# Patient Record
Sex: Male | Born: 1935
Health system: Southern US, Community
[De-identification: ages and names within clinical notes are randomized; demographics above are authoritative.]

## PROBLEM LIST (undated history)

## (undated) DIAGNOSIS — C4490 Unspecified malignant neoplasm of skin, unspecified: Secondary | ICD-10-CM

## (undated) DIAGNOSIS — E039 Hypothyroidism, unspecified: Secondary | ICD-10-CM

## (undated) DIAGNOSIS — K5730 Diverticulosis of large intestine without perforation or abscess without bleeding: Secondary | ICD-10-CM

## (undated) DIAGNOSIS — K589 Irritable bowel syndrome without diarrhea: Secondary | ICD-10-CM

## (undated) DIAGNOSIS — K2270 Barrett's esophagus without dysplasia: Secondary | ICD-10-CM

## (undated) DIAGNOSIS — N401 Enlarged prostate with lower urinary tract symptoms: Secondary | ICD-10-CM

## (undated) DIAGNOSIS — F329 Major depressive disorder, single episode, unspecified: Secondary | ICD-10-CM

## (undated) DIAGNOSIS — N138 Other obstructive and reflux uropathy: Secondary | ICD-10-CM

## (undated) DIAGNOSIS — K449 Diaphragmatic hernia without obstruction or gangrene: Secondary | ICD-10-CM

## (undated) DIAGNOSIS — I10 Essential (primary) hypertension: Secondary | ICD-10-CM

## (undated) DIAGNOSIS — F32A Depression, unspecified: Secondary | ICD-10-CM

## (undated) DIAGNOSIS — K219 Gastro-esophageal reflux disease without esophagitis: Secondary | ICD-10-CM

## (undated) DIAGNOSIS — Z860100 Personal history of colon polyps, unspecified: Secondary | ICD-10-CM

## (undated) DIAGNOSIS — F419 Anxiety disorder, unspecified: Secondary | ICD-10-CM

## (undated) DIAGNOSIS — E785 Hyperlipidemia, unspecified: Secondary | ICD-10-CM

## (undated) DIAGNOSIS — K222 Esophageal obstruction: Secondary | ICD-10-CM

## (undated) DIAGNOSIS — M1990 Unspecified osteoarthritis, unspecified site: Secondary | ICD-10-CM

## (undated) HISTORY — DX: Unspecified malignant neoplasm of skin, unspecified: C44.90

## (undated) HISTORY — DX: Other obstructive and reflux uropathy: N13.8

## (undated) HISTORY — PX: COLONOSCOPY: SHX174

## (undated) HISTORY — DX: Gastro-esophageal reflux disease without esophagitis: K21.9

## (undated) HISTORY — DX: Benign prostatic hyperplasia with lower urinary tract symptoms: N40.1

## (undated) HISTORY — DX: Personal history of colonic polyps: Z86.010

## (undated) HISTORY — DX: Major depressive disorder, single episode, unspecified: F32.9

## (undated) HISTORY — DX: Personal history of colon polyps, unspecified: Z86.0100

## (undated) HISTORY — DX: Esophageal obstruction: K22.2

## (undated) HISTORY — DX: Hyperlipidemia, unspecified: E78.5

## (undated) HISTORY — PX: TONSILLECTOMY: SUR1361

## (undated) HISTORY — DX: Anxiety disorder, unspecified: F41.9

## (undated) HISTORY — DX: Essential (primary) hypertension: I10

## (undated) HISTORY — PX: UPPER GASTROINTESTINAL ENDOSCOPY: SHX188

## (undated) HISTORY — PX: CATARACT EXTRACTION, BILATERAL: SHX1313

## (undated) HISTORY — DX: Barrett's esophagus without dysplasia: K22.70

## (undated) HISTORY — DX: Diverticulosis of large intestine without perforation or abscess without bleeding: K57.30

## (undated) HISTORY — DX: Depression, unspecified: F32.A

## (undated) HISTORY — DX: Irritable bowel syndrome, unspecified: K58.9

## (undated) HISTORY — DX: Diaphragmatic hernia without obstruction or gangrene: K44.9

---

## 1998-01-19 ENCOUNTER — Encounter: Payer: Self-pay | Admitting: Internal Medicine

## 1998-01-19 ENCOUNTER — Ambulatory Visit (HOSPITAL_COMMUNITY): Admission: RE | Admit: 1998-01-19 | Discharge: 1998-01-19 | Payer: Self-pay | Admitting: Internal Medicine

## 1999-02-22 ENCOUNTER — Encounter: Admission: RE | Admit: 1999-02-22 | Discharge: 1999-02-22 | Payer: Self-pay | Admitting: Orthopedic Surgery

## 1999-02-22 ENCOUNTER — Encounter: Payer: Self-pay | Admitting: Orthopedic Surgery

## 1999-10-21 ENCOUNTER — Ambulatory Visit (HOSPITAL_COMMUNITY): Admission: RE | Admit: 1999-10-21 | Discharge: 1999-10-21 | Payer: Self-pay | Admitting: Pulmonary Disease

## 1999-10-21 ENCOUNTER — Encounter: Payer: Self-pay | Admitting: Pulmonary Disease

## 2004-09-09 ENCOUNTER — Ambulatory Visit: Payer: Self-pay | Admitting: Internal Medicine

## 2004-11-04 ENCOUNTER — Ambulatory Visit: Payer: Self-pay | Admitting: Internal Medicine

## 2004-11-14 ENCOUNTER — Ambulatory Visit: Payer: Self-pay | Admitting: Internal Medicine

## 2004-11-22 ENCOUNTER — Ambulatory Visit: Payer: Self-pay | Admitting: Internal Medicine

## 2005-01-27 ENCOUNTER — Ambulatory Visit: Payer: Self-pay | Admitting: Gastroenterology

## 2005-03-28 ENCOUNTER — Ambulatory Visit: Payer: Self-pay | Admitting: Internal Medicine

## 2005-05-09 ENCOUNTER — Ambulatory Visit: Payer: Self-pay | Admitting: Internal Medicine

## 2005-07-04 ENCOUNTER — Ambulatory Visit: Payer: Self-pay | Admitting: Internal Medicine

## 2005-09-06 ENCOUNTER — Ambulatory Visit: Payer: Self-pay | Admitting: Internal Medicine

## 2005-11-23 ENCOUNTER — Ambulatory Visit: Payer: Self-pay | Admitting: Internal Medicine

## 2005-11-23 ENCOUNTER — Encounter: Payer: Self-pay | Admitting: Internal Medicine

## 2005-11-23 DIAGNOSIS — K219 Gastro-esophageal reflux disease without esophagitis: Secondary | ICD-10-CM | POA: Insufficient documentation

## 2005-11-23 DIAGNOSIS — N400 Enlarged prostate without lower urinary tract symptoms: Secondary | ICD-10-CM | POA: Insufficient documentation

## 2005-11-23 DIAGNOSIS — E785 Hyperlipidemia, unspecified: Secondary | ICD-10-CM | POA: Insufficient documentation

## 2005-11-23 LAB — CONVERTED CEMR LAB
Cholesterol, target level: 200 mg/dL
HDL goal, serum: 40 mg/dL
LDL Goal: 160 mg/dL

## 2005-12-14 ENCOUNTER — Ambulatory Visit: Payer: Self-pay | Admitting: Internal Medicine

## 2006-03-05 ENCOUNTER — Ambulatory Visit: Payer: Self-pay | Admitting: Gastroenterology

## 2006-03-09 ENCOUNTER — Encounter (INDEPENDENT_AMBULATORY_CARE_PROVIDER_SITE_OTHER): Payer: Self-pay | Admitting: Specialist

## 2006-03-09 ENCOUNTER — Ambulatory Visit: Payer: Self-pay | Admitting: Gastroenterology

## 2006-11-20 ENCOUNTER — Telehealth: Payer: Self-pay | Admitting: Internal Medicine

## 2007-02-25 ENCOUNTER — Telehealth: Payer: Self-pay | Admitting: Internal Medicine

## 2007-03-13 ENCOUNTER — Ambulatory Visit: Payer: Self-pay | Admitting: Internal Medicine

## 2007-04-02 ENCOUNTER — Telehealth: Payer: Self-pay | Admitting: Internal Medicine

## 2007-09-27 ENCOUNTER — Telehealth: Payer: Self-pay | Admitting: Internal Medicine

## 2007-11-07 ENCOUNTER — Telehealth (INDEPENDENT_AMBULATORY_CARE_PROVIDER_SITE_OTHER): Payer: Self-pay | Admitting: *Deleted

## 2007-11-14 ENCOUNTER — Encounter: Payer: Self-pay | Admitting: Internal Medicine

## 2008-02-26 ENCOUNTER — Encounter: Payer: Self-pay | Admitting: Internal Medicine

## 2008-04-14 ENCOUNTER — Ambulatory Visit: Payer: Self-pay | Admitting: Family Medicine

## 2008-04-16 ENCOUNTER — Telehealth: Payer: Self-pay | Admitting: Family Medicine

## 2008-06-22 ENCOUNTER — Telehealth (INDEPENDENT_AMBULATORY_CARE_PROVIDER_SITE_OTHER): Payer: Self-pay | Admitting: *Deleted

## 2008-10-28 ENCOUNTER — Ambulatory Visit: Payer: Self-pay | Admitting: Internal Medicine

## 2008-10-30 LAB — CONVERTED CEMR LAB
ALT: 29 units/L (ref 0–53)
AST: 35 units/L (ref 0–37)
Albumin: 4 g/dL (ref 3.5–5.2)
Alkaline Phosphatase: 63 units/L (ref 39–117)
BUN: 22 mg/dL (ref 6–23)
Basophils Absolute: 0 10*3/uL (ref 0.0–0.1)
Basophils Relative: 0.2 % (ref 0.0–3.0)
Bilirubin, Direct: 0 mg/dL (ref 0.0–0.3)
CO2: 27 meq/L (ref 19–32)
Calcium: 9.3 mg/dL (ref 8.4–10.5)
Chloride: 106 meq/L (ref 96–112)
Cholesterol: 200 mg/dL (ref 0–200)
Creatinine, Ser: 1.2 mg/dL (ref 0.4–1.5)
Direct LDL: 92.1 mg/dL
Eosinophils Absolute: 0.1 10*3/uL (ref 0.0–0.7)
Eosinophils Relative: 1.9 % (ref 0.0–5.0)
GFR calc non Af Amer: 63.45 mL/min (ref >60)
Glucose, Bld: 91 mg/dL (ref 70–99)
HCT: 45.1 % (ref 39.0–52.0)
HDL: 41.3 mg/dL (ref >39.00)
Hemoglobin: 15.5 g/dL (ref 13.0–17.0)
Lymphocytes Relative: 34.5 % (ref 12.0–46.0)
Lymphs Abs: 2.3 10*3/uL (ref 0.7–4.0)
MCHC: 34.4 g/dL (ref 30.0–36.0)
MCV: 92 fL (ref 78.0–100.0)
Monocytes Absolute: 0.8 10*3/uL (ref 0.1–1.0)
Monocytes Relative: 11.1 % (ref 3.0–12.0)
Neutro Abs: 3.6 10*3/uL (ref 1.4–7.7)
Neutrophils Relative %: 52.3 % (ref 43.0–77.0)
Platelets: 224 10*3/uL (ref 150.0–400.0)
Potassium: 4.5 meq/L (ref 3.5–5.1)
RBC: 4.9 M/uL (ref 4.22–5.81)
RDW: 12.3 % (ref 11.5–14.6)
Sodium: 138 meq/L (ref 135–145)
Total Bilirubin: 1 mg/dL (ref 0.3–1.2)
Total CHOL/HDL Ratio: 5
Total Protein: 7 g/dL (ref 6.0–8.3)
Triglycerides: 264 mg/dL — ABNORMAL HIGH (ref 0.0–149.0)
VLDL: 52.8 mg/dL — ABNORMAL HIGH (ref 0.0–40.0)
WBC: 6.8 10*3/uL (ref 4.5–10.5)

## 2009-01-12 ENCOUNTER — Telehealth (INDEPENDENT_AMBULATORY_CARE_PROVIDER_SITE_OTHER): Payer: Self-pay | Admitting: *Deleted

## 2009-02-02 ENCOUNTER — Encounter (INDEPENDENT_AMBULATORY_CARE_PROVIDER_SITE_OTHER): Payer: Self-pay | Admitting: *Deleted

## 2009-11-17 ENCOUNTER — Telehealth: Payer: Self-pay | Admitting: Gastroenterology

## 2009-11-17 ENCOUNTER — Encounter (INDEPENDENT_AMBULATORY_CARE_PROVIDER_SITE_OTHER): Payer: Self-pay | Admitting: *Deleted

## 2009-11-29 ENCOUNTER — Ambulatory Visit: Payer: Self-pay | Admitting: Internal Medicine

## 2009-12-02 LAB — CONVERTED CEMR LAB
ALT: 28 units/L (ref 0–53)
AST: 31 units/L (ref 0–37)
Albumin: 3.8 g/dL (ref 3.5–5.2)
Alkaline Phosphatase: 61 units/L (ref 39–117)
BUN: 19 mg/dL (ref 6–23)
Basophils Absolute: 0 10*3/uL (ref 0.0–0.1)
Basophils Relative: 0.7 % (ref 0.0–3.0)
Bilirubin, Direct: 0.1 mg/dL (ref 0.0–0.3)
CO2: 28 meq/L (ref 19–32)
Calcium: 9 mg/dL (ref 8.4–10.5)
Chloride: 106 meq/L (ref 96–112)
Cholesterol: 201 mg/dL — ABNORMAL HIGH (ref 0–200)
Creatinine, Ser: 1.2 mg/dL (ref 0.4–1.5)
Direct LDL: 113.7 mg/dL
Eosinophils Absolute: 0.1 10*3/uL (ref 0.0–0.7)
Eosinophils Relative: 2.1 % (ref 0.0–5.0)
GFR calc non Af Amer: 62.06 mL/min (ref >60)
Glucose, Bld: 95 mg/dL (ref 70–99)
HCT: 45.4 % (ref 39.0–52.0)
HDL: 44.9 mg/dL (ref >39.00)
Hemoglobin: 15.5 g/dL (ref 13.0–17.0)
Lymphocytes Relative: 35.3 % (ref 12.0–46.0)
Lymphs Abs: 2.4 10*3/uL (ref 0.7–4.0)
MCHC: 34.1 g/dL (ref 30.0–36.0)
MCV: 91.6 fL (ref 78.0–100.0)
Monocytes Absolute: 0.6 10*3/uL (ref 0.1–1.0)
Monocytes Relative: 9.2 % (ref 3.0–12.0)
Neutro Abs: 3.6 10*3/uL (ref 1.4–7.7)
Neutrophils Relative %: 52.7 % (ref 43.0–77.0)
PSA: 1.18 ng/mL (ref 0.10–4.00)
Platelets: 244 10*3/uL (ref 150.0–400.0)
Potassium: 4.3 meq/L (ref 3.5–5.1)
RBC: 4.96 M/uL (ref 4.22–5.81)
RDW: 13 % (ref 11.5–14.6)
Sodium: 140 meq/L (ref 135–145)
TSH: 3.9 microintl units/mL (ref 0.35–5.50)
Total Bilirubin: 0.5 mg/dL (ref 0.3–1.2)
Total CHOL/HDL Ratio: 4
Total Protein: 6.5 g/dL (ref 6.0–8.3)
Triglycerides: 140 mg/dL (ref 0.0–149.0)
VLDL: 28 mg/dL (ref 0.0–40.0)
WBC: 6.9 10*3/uL (ref 4.5–10.5)

## 2009-12-16 DIAGNOSIS — Z860100 Personal history of colon polyps, unspecified: Secondary | ICD-10-CM | POA: Insufficient documentation

## 2009-12-16 DIAGNOSIS — K222 Esophageal obstruction: Secondary | ICD-10-CM | POA: Insufficient documentation

## 2009-12-16 DIAGNOSIS — K449 Diaphragmatic hernia without obstruction or gangrene: Secondary | ICD-10-CM | POA: Insufficient documentation

## 2009-12-16 DIAGNOSIS — K5730 Diverticulosis of large intestine without perforation or abscess without bleeding: Secondary | ICD-10-CM | POA: Insufficient documentation

## 2010-01-10 ENCOUNTER — Telehealth: Payer: Self-pay | Admitting: Gastroenterology

## 2010-01-11 ENCOUNTER — Encounter: Payer: Self-pay | Admitting: Gastroenterology

## 2010-01-12 ENCOUNTER — Ambulatory Visit: Payer: Self-pay | Admitting: Gastroenterology

## 2010-01-12 LAB — CONVERTED CEMR LAB: UREASE: NEGATIVE

## 2010-01-17 ENCOUNTER — Encounter: Payer: Self-pay | Admitting: Gastroenterology

## 2010-01-18 ENCOUNTER — Encounter: Payer: Self-pay | Admitting: Gastroenterology

## 2010-02-09 ENCOUNTER — Telehealth: Payer: Self-pay | Admitting: Gastroenterology

## 2010-03-04 ENCOUNTER — Ambulatory Visit
Admission: RE | Admit: 2010-03-04 | Discharge: 2010-03-04 | Payer: Self-pay | Source: Home / Self Care | Attending: Gastroenterology | Admitting: Gastroenterology

## 2010-03-04 DIAGNOSIS — K589 Irritable bowel syndrome without diarrhea: Secondary | ICD-10-CM | POA: Insufficient documentation

## 2010-03-04 DIAGNOSIS — K2270 Barrett's esophagus without dysplasia: Secondary | ICD-10-CM | POA: Insufficient documentation

## 2010-03-15 ENCOUNTER — Telehealth: Payer: Self-pay | Admitting: Gastroenterology

## 2010-03-18 ENCOUNTER — Telehealth: Payer: Self-pay | Admitting: Gastroenterology

## 2010-03-20 LAB — CONVERTED CEMR LAB
ALT: 28 units/L (ref 0–53)
AST: 33 units/L (ref 0–37)
Albumin: 4.1 g/dL (ref 3.5–5.2)
Alkaline Phosphatase: 54 units/L (ref 39–117)
BUN: 19 mg/dL (ref 6–23)
Basophils Absolute: 0 10*3/uL (ref 0.0–0.1)
Basophils Relative: 0.2 % (ref 0.0–1.0)
Bilirubin Urine: NEGATIVE
Bilirubin, Direct: 0.2 mg/dL (ref 0.0–0.3)
Blood in Urine, dipstick: NEGATIVE
CO2: 26 meq/L (ref 19–32)
Calcium: 9.6 mg/dL (ref 8.4–10.5)
Chloride: 106 meq/L (ref 96–112)
Creatinine, Ser: 1.2 mg/dL (ref 0.4–1.5)
Eosinophils Absolute: 0.1 10*3/uL (ref 0.0–0.6)
Eosinophils Relative: 1.2 % (ref 0.0–5.0)
GFR calc Af Amer: 77 mL/min
GFR calc non Af Amer: 64 mL/min
Glucose, Bld: 101 mg/dL — ABNORMAL HIGH (ref 70–99)
Glucose, Urine, Semiquant: NEGATIVE
HCT: 45.8 % (ref 39.0–52.0)
Hemoglobin: 16.4 g/dL (ref 13.0–17.0)
Ketones, urine, test strip: NEGATIVE
Lymphocytes Relative: 30.9 % (ref 12.0–46.0)
MCHC: 35.8 g/dL (ref 30.0–36.0)
MCV: 87.5 fL (ref 78.0–100.0)
Monocytes Absolute: 0.7 10*3/uL (ref 0.2–0.7)
Monocytes Relative: 8.8 % (ref 3.0–11.0)
Neutro Abs: 4.6 10*3/uL (ref 1.4–7.7)
Neutrophils Relative %: 58.9 % (ref 43.0–77.0)
Nitrite: NEGATIVE
PSA: 0.87 ng/mL (ref 0.10–4.00)
Platelets: 260 10*3/uL (ref 150–400)
Potassium: 4.8 meq/L (ref 3.5–5.1)
Protein, U semiquant: NEGATIVE
RBC: 5.23 M/uL (ref 4.22–5.81)
RDW: 12 % (ref 11.5–14.6)
Sodium: 139 meq/L (ref 135–145)
Specific Gravity, Urine: 1.02
TSH: 2.34 microintl units/mL (ref 0.35–5.50)
Total Bilirubin: 1 mg/dL (ref 0.3–1.2)
Total Protein: 6.8 g/dL (ref 6.0–8.3)
Urobilinogen, UA: 0.2
WBC Urine, dipstick: NEGATIVE
WBC: 7.8 10*3/uL (ref 4.5–10.5)
pH: 6.5

## 2010-03-21 ENCOUNTER — Ambulatory Visit: Admit: 2010-03-21 | Payer: Self-pay | Admitting: Gastroenterology

## 2010-03-22 NOTE — Consult Note (Signed)
Summary: [facility]  [facility]   Imported By: Kassie Mends 11/29/2007 12:46:15  _____________________________________________________________________  External Attachment:    Type:   Image     Comment:   [facility]

## 2010-03-22 NOTE — Procedures (Signed)
Summary: Upper Endoscopy  Patient: Marvin Jones Note: All result statuses are Final unless otherwise noted.  Tests: (1) Upper Endoscopy (EGD)   EGD Upper Endoscopy       DONE     [facility]     [address]     [place], [place]  [address]           ENDOSCOPY PROCEDURE REPORT           PATIENT:  Marvin Jones, Marvin Jones  MR#:  [id number]     BIRTHDATE:  1935/07/28, 74 yrs. old  GENDER:  male           ENDOSCOPIST:  Vania Rea. Jarold Motto, MD, [facility]     Referred by:           PROCEDURE DATE:  01/12/2010     PROCEDURE:  EGD with biopsy, 432[address], Maloney Dilation of Esophagus     ASA CLASS:  Class II     INDICATIONS:  epigastric pain, dysphagia           MEDICATIONS:   Fentanyl 25 mcg IV, Versed 3 mg IV     TOPICAL ANESTHETIC:  Exactacain Spray           DESCRIPTION OF PROCEDURE:   After the risks benefits and     alternatives of the procedure were thoroughly explained, informed     consent was obtained.  The LB GIF-H180 [id number] endoscope was     introduced through the mouth and advanced to the second portion of     the duodenum, without limitations.  The instrument was slowly     withdrawn as the mucosa was fully examined.     <<PROCEDUREIMAGES>>           Duodenitis was found. red,beefy duodenal bulb,no ulcer,clo bx.     done.  A stricture was found at the gastroesophageal junction.     ULCERATED CIRCUFERENTIAL STRICTURE BIOPSIED AND DILATED #41F     MALONEY DILATOR.  The stomach was entered and closely examined.     The antrum, angularis, and lesser curvature were well visualized,     including a retroflexed view of the cardia and fundus. The stomach     wall was normally distensable. The scope passed easily through the     pylorus into the duodenum.    NO LARGE HH NOTED.  The scope was     then withdrawn from the patient and the procedure completed.           COMPLICATIONS:  None           ENDOSCOPIC IMPRESSION:     1) Duodenitis     2) Stricture at the gastroesophageal  junction     3) Normal stomach     1.STRICTURE.PROBABLE ULCERATION FROM FOOD IMPACTION,R/O     BARRETT'S MUCOSA AND ATYPIA.     2.SEVERE DUODENITIS PROBABLY FROM NSAID USE,R/O H.PYLORI     INFECTION     RECOMMENDATIONS:     1) Avoid NSAIDS for two weeks     2) Await biopsy results     3) Clear liquids until, then soft foods rest iof day. Resume     prior diet tomorrow.     4) OP follow-up in 2 weeks.     5) Rx CLO if positive     START ACIPHEX 20 MG/QAM.RX LIQUID CARAFATE PC AND QHS FOR     SEVERAL DAYS.1PT. 5CC PC AND QHS.REFILLX2.  REPEAT EXAM:  No           ______________________________     Vania Rea. Jarold Motto, MD, [facility]           CC:  Lindley Magnus, MD           n.     Rosalie DoctorVania Rea. Itzel Mckibbin at 01/12/2010 09:13 AM           Rebeca Allegra, [id number]  Note: An exclamation mark (!) indicates a result that was not dispersed into the flowsheet. Document Creation Date: 01/12/2010 9:14 AM _______________________________________________________________________  (1) Order result status: Final Collection or observation date-time: 01/12/2010 08:58 Requested date-time:  Receipt date-time:  Reported date-time:  Referring Physician:   Ordering Physician: Sheryn Bison [phone]) Specimen Source:  Source: Launa Grill Order Number: [id number] Lab site:   Appended Document: Upper Endoscopy     Procedures Next Due Date:    EGD: 12/2012

## 2010-03-22 NOTE — Progress Notes (Signed)
Summary: FEELING BETTER  Phone Note Call from Patient Call back at [phone]   Caller: PT LIVE Call For: STAFFORD Summary of Call: PATIENT IS FEELING BETTER,FEVER GONE ,BUTIS VERY TIRED.  PATIENT WANTS TO THANK YOU FOR CALLING HIM. Initial call taken by: Celine Ahr,  April 16, 2008 10:48 AM  Follow-up for Phone Call        dr stafford infformed Follow-up by: Pura Spice, RN,  April 16, 2008 10:57 AM

## 2010-03-22 NOTE — Procedures (Signed)
Summary: EGD / [facility]  EGD / [facility]   Imported By: Lennie Odor 10/28/2008 15:51:00  _____________________________________________________________________  External Attachment:    Type:   Image     Comment:   External Document

## 2010-03-22 NOTE — Letter (Signed)
Summary: Colonoscopy Date Change Letter  [facility]  [address] [place], [place] [address]   Phone: [phone]  Fax: [phone]      February 02, 2009 MRN: [id number]   [facility] [address] [place], [place]  [address]   Dear Mr. Zeitlin,   Previously you were recommended to have a repeat colonoscopy around this time. Your chart was recently reviewed by Dr. Vania Rea. Jarold Motto of [facility]. Follow up colonoscopy is now recommended in January 2013. This revised recommendation is based on current, nationally recognized guidelines for colorectal cancer screening and polyp surveillance. These guidelines are endorsed by the American Cancer Society, [employer] on Colorectal Cancer as well as numerous other major medical organizations.  Please understand that our recommendation assumes that you do not have any new symptoms such as bleeding, a change in bowel habits, anemia, or significant abdominal discomfort. If you do have any concerning GI symptoms or want to discuss the guideline recommendations, please call to arrange an office visit at your earliest convenience. Otherwise we will keep you in our reminder system and contact you 1-2 months prior to the date listed above to schedule your next colonoscopy.  Thank you,  Vania Rea. Jarold Motto, M.D.  [facility] [phone]

## 2010-03-22 NOTE — Progress Notes (Signed)
Summary: med    Phone Note Call from Patient Call back at [facility] Phone [phone]   Caller: Patient Call For: DR Nikolaj Geraghty Reason for Call: Talk to Doctor Summary of Call: PT HAS A CPX ON 01/16/07. HE IS FEELING A LITTLE DOWN LATELY AND WOULD LIKE AN UPDATED RX OF FLUOXETINE 10MG  1QD. [facility]. Initial call taken by: Warnell Forester,  November 20, 2006 2:42 PM  Follow-up for Phone Call        ok make sure not on celexa or other antidepressant Follow-up by: Birdie Sons MD,  November 20, 2006 4:11 PM  Additional Follow-up for Phone Call Additional follow up Details #1::        [facility] [phone].  NKDA.  Is not on celexa.  Thinks he did try it & it made him dizzy & he stopped it.  Med called in. Additional Follow-up by: Rudy Jew, RN,  November 20, 2006 4:31 PM

## 2010-03-22 NOTE — Miscellaneous (Signed)
Summary: Aciphex RX  Clinical Lists Changes  Medications: Added new medication of ACIPHEX 20 MG TBEC (RABEPRAZOLE SODIUM) take one by mouth once daily - Signed Removed medication of CARAFATE 1 GM/10ML  SUSP (SUCRALFATE) 5 ml pc and hs for several days Rx of ACIPHEX 20 MG TBEC (RABEPRAZOLE SODIUM) take one by mouth once daily;  #30 x 6;  Signed;  Entered by: Harlow Mares CMA (AAMA);  Authorized by: Mardella Layman MD [facility]  Method used: [facility] [phone]*, [address], [place], [place], [place]  [address], Ph: [phone] or [phone], Fax: [phone]    Prescriptions: ACIPHEX 20 MG TBEC (RABEPRAZOLE SODIUM) take one by mouth once daily  #30 x 6   Entered by:   Harlow Mares CMA (AAMA)   Authorized by:   Mardella Layman MD [facility]   Signed by:   Harlow Mares CMA (AAMA) on 01/17/2010   Method used:   Electronically to        [facility] [address] [phone]* (retail)       [address]       [place], [place]  [address]       Ph: [phone] or [phone]       Fax: [phone]   RxID:   [id number]

## 2010-03-22 NOTE — Miscellaneous (Signed)
Summary: Endo order  pt aware and oked by Dixie.  Clinical Lists Changes  Orders: Added new Test order of EGD SAV (EGD SAV) - Signed

## 2010-03-22 NOTE — Miscellaneous (Signed)
Summary: Immunization Entry--H1N1   Other Immunization History:    H1N1 # 1:  H1N1 vaccine G code (02/23/2008)

## 2010-03-22 NOTE — Progress Notes (Signed)
----   Converted from flag ---- ---- 11/07/2007 12:33 PM, Birdie Sons MD wrote: Thanks Theresia Bough ask our referal coordinator to schedule---Marvin Jones (thanks)  ---- 11/07/2007 11:49 AM, Mardella Layman MD FACG,FAGA wrote: Smitty Cords... [name] is a patient and a friend of mine. He has rather marked urinary hesitancy and nocturia and I suspect rather severe BPH. His PSA level is within normal but I urge you to refer him to Dr. Shiela Mayer for evaluation and renal ultrasound to exclude any significant hydronephrosis et Karie Soda. He talks to me weekly about his urologic problems and I think he really does well urologic evaluation. I told him I would contact you for your opinion and referral since you are his primary care physician. Sincerely Vania Rea. Jarold Motto M.D. ------------------------------  Referral Appointment Information Day/Date:09/24 Time:11:30 Place/MD:Dr. Vonita Moss Address:[address] Phone/Fax:[phone] Patient aware [name]  November 07, 2007 12:52 PM

## 2010-03-22 NOTE — Assessment & Plan Note (Signed)
Summary: GI ISSUES//CCM   Vital Signs:  Patient profile:   75 year old male Weight:      185 pounds Temp:     97.8 degrees F Pulse rate:   60 / minute Pulse rhythm:   skips Resp:     12 per minute BP sitting:   138 / 80  (left arm)  Vitals Entered By: Gladis Riffle, RN (October 28, 2008 9:29 AM)  History of Present Illness: Intermittent crampy abdominal pain below naval. no fever or chills. can have as many as 3 bms per day but not diarrhea. He notes that decreased fiber in diet seems to help symptoms. bland foods seem to help. peptobismol also provides relief---he uses several days/week. Symptoms may occur for 3 days consecutively followed by 2 weeks of relief.  Duration 3-4 years  Current Problems (verified): 1)  Preventive Health Care  (ICD-V70.0) 2)  Benign Prostatic Hypertrophy, With Urinary Obstruction  (ICD-600.01) 3)  Hyperlipidemia  (ICD-272.4) 4)  Gerd  (ICD-530.81) 5)  Depression  (ICD-311) 6)  Anxiety  (ICD-300.00)  Current Medications (verified): 1)  Lipitor 10 Mg Tabs (Atorvastatin Calcium) .[name] Kitchen.. 1 By Mouth Once Daily 2)  Aspir-Low 81 Mg Tbec (Aspirin) .[name] Kitchen.. 1 By Mouth Once Daily 3)  Prevacid 30 Mg  Cpdr (Lansoprazole) .... One By Mouth Daily As Needed 4)  Fluoxetine Hcl 10 Mg  Tabs (Fluoxetine Hcl) .... 1/2-1 Tablet Once Daily As Needed 5)  Pepto-Bismol 262 Mg/58ml Susp (Bismuth Subsalicylate) .... As Needed Per Pt 6)  Advil 200 Mg Tabs (Ibuprofen) .... Prn  Allergies (verified): No Known Drug Allergies  Comments:  Nurse/Medical Assistant: c/o intermittent burning sensation below navel, uses pepto bismol and has decreased roughage, bland diet---also c/o pain in thumbs  The patient's medications and allergies were reviewed with the patient and were updated in the Medication and Allergy Lists. Gladis Riffle, RN (October 28, 2008 9:32 AM)  Past History:  Past Medical History: Last updated: 11/23/2005 Anxiety Depression GERD Hyperlipidemia  Family  History: Last updated: 11/23/2005 Father: MI age 39 Mother:   Social History: Last updated: 11/23/2005 Married Alcohol use-yes 3 kids: one with schizophrenia  Risk Factors: Smoking Status: never (11/23/2005)  Review of Systems       All other systems reviewed and were negative   Physical Exam  General:  Well-developed,well-nourished,in no acute distress; alert,appropriate and cooperative throughout examination Head:  normocephalic and atraumatic.   Eyes:  pupils equal and pupils round.   Ears:  R ear normal and L ear normal.   Nose:  External nasal examination shows no deformity or inflammation. Nasal mucosa are pink and moist without lesions or exudates. Neck:  No deformities, masses, or tenderness noted. Chest Wall:  No deformities, masses, tenderness or gynecomastia noted. Lungs:  Normal respiratory effort, chest expands symmetrically. Lungs are clear to auscultation, no crackles or wheezes. Heart:  Normal rate and regular rhythm. S1 and S2 normal without gallop, murmur, click, rub or other extra sounds. Abdomen:  Bowel sounds positive,abdomen soft and non-tender without masses, organomegaly or hernias noted. Msk:  No deformity or scoliosis noted of thoracic or lumbar spine.   Neurologic:  cranial nerves II-XII intact and gait normal.     Impression & Recommendations:  Problem # 1:  ABDOMINAL PAIN (ICD-789.00)  i think most likely irritable bowel syndrome says he had colonoscopy 2 years ago---I'll get that report will try increase fiber diet, will try lomotil as needed  side effects discussed  Orders: Venipuncture (86578) TLB-CBC  Platelet - w/Differential (85025-CBCD) TLB-BMP (Basic Metabolic Panel-BMET) (80048-METABOL) TLB-Hepatic/Liver Function Pnl (80076-HEPATIC)  Complete Medication List: 1)  Lipitor 10 Mg Tabs (Atorvastatin calcium) .[name] Kitchen.. 1 by mouth once daily 2)  Aspir-low 81 Mg Tbec (Aspirin) .[name] Kitchen.. 1 by mouth once daily 3)  Prevacid 30 Mg Cpdr  (Lansoprazole) .... One by mouth daily as needed 4)  Fluoxetine Hcl 10 Mg Tabs (Fluoxetine hcl) .... 1/2-1 tablet once daily as needed 5)  Advil 200 Mg Tabs (Ibuprofen) .... Prn 6)  Lomotil 2.5-0.025 Mg Tabs (Diphenoxylate-atropine) .... Take 1 tablet by mouth once a day as needed for frequent bowel syndromes.  Other Orders: TLB-Lipid Panel (80061-LIPID)  Prescriptions: LOMOTIL 2.5-0.025 MG TABS (DIPHENOXYLATE-ATROPINE) Take 1 tablet by mouth once a day as needed for frequent bowel syndromes.  #30 x 1   Entered and Authorized by:   Birdie Sons MD   Signed by:   Birdie Sons MD on 10/28/2008   Method used:   Print then Give to Patient   RxID:   [id number]

## 2010-03-22 NOTE — Progress Notes (Signed)
Summary: Note form Dr. Jarold Motto  Medications Added BENTYL 10 MG  CAPS (DICYCLOMINE HCL) 1 by mouth three times a day before meals       ---- Converted from flag ---- ---- 01/12/2009 9:36 AM, Mardella Layman MD [facility] wrote: Marvin Jones has called and complains of burning lower abdominal pain with some diarrhea. He has refused followup office exam or followup flex sigmoid exam. He had colonoscopy 2 years ago with removal of colon polyps and diverticulosis noted. Chart review today shows he is on Prevacid and p.r.n. NSAIDs which need to be discontinued. This possibly may have a mild colitis related to Prevacid use. We will try low-dose Bentyl 10 mg t.i.d. before meals. [facility]. The patient is a close friend of mine and I Would Urge Dr. Cato Mulligan to suggest to [name] that he perhaps sees Dr. Yancey Flemings for followup if he does not improve. Because of our friendship, he apparently is very reluctant for me to direct his GI care. I will have my nurse telephone his prescription and call him to stop NSAIDs and Prevacid. Will Send This Note to Dr. Cato Mulligan and Dr. Marina Goodell. ------------------------------  Pt notified.  Spoke with pt's wife. Prescriptions: BENTYL 10 MG  CAPS (DICYCLOMINE HCL) 1 by mouth three times a day before meals  #60 x 3   Entered by:   Marvin Cordia RN   Authorized by:   Mardella Layman MD [facility]   Signed by:   Marvin Cordia RN on 01/12/2009   Method used:   Electronically to        [facility] [address] [phone]* (retail)       [address]       [place], [place]  [address]       Ph: [phone] or [phone]       Fax: [phone]   RxID:   [id number]

## 2010-03-22 NOTE — Letter (Signed)
Summary: Patient Notice-[facility]  [address] [place], [place] [address]   Phone: [phone]  Fax: [phone]    3    January 17, 2010 MRN: [id number]    [facility] [address] [place], [place]  [address]    Dear Mr. [name],  I am pleased to inform you that the biopsies taken during your recent endoscopic examination did not show any evidence of cancer upon pathologic examination.  However, your biopsies indicate you have a condition known as Barrett's esophagus. While not cancer, it is pre-cancerous (can progress to cancer) and needs to be monitored with repeat endoscopic examination and biopsies.  Fortunately, it is quite rare that this develops into cancer, but careful monitoring of the condition along with taking your medication as prescribed is important in reducing the risk of developing cancer.  It is my recommendation that you have a repeat upper gastrointestinal endoscopic examination in 3_ years.  Additional information/recommendations:  _X_Please call [phone] to schedule a return visit to further      evaluate your condition.YOUR ACIPHEX WAS CALLED IN...YOU WILL NEED TO TAKE THIS FROM HERE ON OUT....  __Continue with treatment plan as outlined the day of your exam.  Please call us if you have or develop heartburn, reflux symptoms, any swallowing problems, or if you have questions about your condition that have not been fully answered at this time.  Sincerely,  Mardella Layman MD [facility]  This letter has been electronically signed by your physician.  Appended Document: Patient Notice-[facility]opghagus Letter mailed

## 2010-03-22 NOTE — Procedures (Signed)
Summary: Colonoscopy & Pathology  Colonoscopy / [facility]   Imported By: Lennie Odor 10/28/2008 15:52:38  _____________________________________________________________________  External Attachment:    Type:   Image     Comment:   External Document  Appended Document: Colonoscopy / [facility] Pathology - STATUS: Final             By: Threasa Beards  ,        Perform Date: 18Jan08 00:01  Ordered By: Talbert Forest Date: 21Jan08 12:24  Facility: [facility]                               Department: CPATH  Service Report Text  [facility]   [address] [address]   [place], [place] [address]   Telephone [phone] or [phone] Fax [phone]    REPORT OF SURGICAL PATHOLOGY    Case #: [id number]   Patient Name: Marvin Jones, Marvin Jones.   Office Chart Number: [id number]    MRN: [id number]   Pathologist: Havery Moros, MD   DOB/Age February 01, 1936 (Age: 75) Gender: M   Date Taken: 03/09/2006   Date Received: 03/12/2006    FINAL DIAGNOSIS    ***MICROSCOPIC EXAMINATION AND DIAGNOSIS***    1. CECUM AND DESCENDING COLON, POLYP(S): ADENOMATOUS POLYP(S).   NO HIGH GRADE DYSPLASIA OR INVASIVE MALIGNANCY IDENTIFIED.    2. SIGMOID COLON, POLYP(S): ADENOMATOUS POLYP(S). NO HIGH   GRADE DYSPLASIA OR INVASIVE MALIGNANCY IDENTIFIED.    kv   Date Reported: 03/13/2006 Havery Moros, MD   *** Electronically Signed Out By BNS ***    Clinical information   R/O adenoma (caf)    specimen(s) obtained   1: Colon, polyp(s), cecum and descending   2: Colon, polyp(s), sigmoid    Gross Description   1. Received in formalin are tan, soft tissue fragments that are   submitted in toto. Number: two.   Size: 0.2 and 0.3 cm, one block.    2. Received in formalin are tan, soft tissue fragments that are   submitted in toto. Number: six   Size: 0.1 to 0.7 cm, two blocks submitted. (TB:gt, 03/12/06)    gdt/

## 2010-03-22 NOTE — Progress Notes (Signed)
Summary: triage   Phone Note Call from Patient Call back at Home Phone [phone]   Caller: Patient Call For: Dr. Jarold Motto Reason for Call: Talk to Nurse Details for Reason: triage Summary of Call: Pt. called stating he was having problems with food getting stuck.  Feels he needs to have esophagus stretched. Do you want to go ahead and scheduled procedure or do you want him to come in for an office visit? Initial call taken by: Schuyler Amor,  January 10, 2010 9:10 AM  Follow-up for Phone Call        Left message for patient to call back Darcey Nora RN, [facility]  January 10, 2010 11:17 AM  NP3 scheduled for 01/18/10 8:45 Follow-up by: Darcey Nora RN, CGRN,  January 10, 2010 11:57 AM

## 2010-03-22 NOTE — Letter (Signed)
Summary: New Patient letter  [facility]  [address] [place], [place] [address]   Phone: [phone]  Fax: [phone]       11/17/2009 MRN: [id number]  [facility] [address] [place], [place]  [address]  Dear Mr. Gabbert,  Welcome to the Gastroenterology Division at [facility].    You are scheduled to see Dr.  Jarold Motto on 12/21/2009 at 10:30am on the 3rd floor at [facility], [address]  We ask that you try to arrive at our office 15 minutes prior to your appointment time to allow for check-in.  We would like you to complete the enclosed self-administered evaluation form prior to your visit and bring it with you on the day of your appointment.  We will review it with you.  Also, please bring a complete list of all your medications or, if you prefer, bring the medication bottles and we will list them.  Please bring your insurance card so that we may make a copy of it.  If your insurance requires a referral to see a specialist, please bring your referral form from your primary care physician.  Co-payments are due at the time of your visit and may be paid by cash, check or credit card.     Your office visit will consist of a consult with your physician (includes a physical exam), any laboratory testing he/she may order, scheduling of any necessary diagnostic testing (e.g. x-ray, ultrasound, CT-scan), and scheduling of a procedure (e.g. Endoscopy, Colonoscopy) if required.  Please allow enough time on your schedule to allow for any/all of these possibilities.    If you cannot keep your appointment, please call [phone] to cancel or reschedule prior to your appointment date.  This allows [place] the opportunity to schedule an appointment for another patient in need of care.  If you do not cancel or reschedule by 5 p.m. the business day prior to your appointment date, you will be charged a $50.00 late cancellation/no-show fee.    Thank you for choosing  [facility] for your medical needs.  We appreciate the opportunity to care for you.  Please visit [place] at our website  to learn more about our practice.                     Sincerely,                                                             The Gastroenterology Division

## 2010-03-22 NOTE — Progress Notes (Signed)
Summary: Visit needed   ---- Converted from flag ---- ---- 11/16/2009 10:03 AM, Harlow Mares CMA (AAMA) wrote: No Answer    ---- 11/16/2009 9:47 AM, Mardella Layman MD [facility] wrote: please schedule him to see me in the office. He can be reached at [phone]. ------------------------------  Phone Note Outgoing Call Call back at [facility] Phone [phone]   Call placed by: Harlow Mares CMA [facility]),  November 17, 2009 12:06 PM Call placed to: Patient Summary of Call: per Dr. Jarold Motto ok to make an appt with him not perry. appt made for 12/21/2009 Initial call taken by: Harlow Mares CMA [facility]),  November 17, 2009 12:06 PM

## 2010-03-22 NOTE — Progress Notes (Signed)
Summary: Fluoxetine Rx requst  Phone Note Call from Patient Call back at Home Phone [phone]   Caller: Patient Call For: Swords/Ellen Summary of Call: Pt requesting refill of Fluoxetine 10 mg.  Pt takes 1/2 daily, having some challenging times with their handicap son. Pt med list shows Lexapro, however he took about 8-9 and developed stomach upset ahd felt whoozy, so he no longer takes that med. [facility] [address] [address] Initial call taken by: Sid Falcon LPN,  September 27, 2007 9:33 AM  Follow-up for Phone Call        Rx will be done.Patient notified.  Follow-up by: Gladis Riffle, RN,  September 27, 2007 10:08 AM    New/Updated Medications: FLUOXETINE HCL 10 MG  TABS (FLUOXETINE HCL) 1/2-1 tablet once daily

## 2010-03-22 NOTE — Progress Notes (Signed)
Summary: labs & BP appt   Phone Note Call from Patient Call back at [phone]   Call For: Marvin Jones Summary of Call: 1)  Requesting copy of my labs with any notations be mailed to my house 2) Kerri Asche told me to keep a check on my BP since it's borderline high.  It was 148 at [facility].  Need appt for BP check.  Follow-up for Phone Call        okay to give results of labs.  Schedule office visit to discuss blood pressure.  No urgency. Follow-up by: Birdie Sons MD,  April 03, 2007 8:10 AM  Additional Follow-up for Phone Call Additional follow up Details #1::        Pt. given lab results, and he will call back for office visit appt. Additional Follow-up by: Lynann Beaver CMA,  April 03, 2007 9:47 AM

## 2010-03-22 NOTE — Assessment & Plan Note (Signed)
Summary: PATIENT FASTING/MHF  Medications Added FLOMAX 0.4 MG CP24 (TAMSULOSIN HCL) stopped---------------1 by mouth once daily----- LEXAPRO 10 MG TABS (ESCITALOPRAM OXALATE) Take 1 tab by mouth daily      Allergies Added: NKDA  Vital Signs:  Patient Profile:   75 Years Old Male Height:     69.5 inches (176.53 cm) Weight:      180 pounds (81.82 kg) Temp:     97.8 degrees F (36.56 degrees C) oral Pulse rate:   67 / minute BP sitting:   140 / 77  (right arm)  Pt. in pain?   no  Vitals Entered By: Arcola Jansky, RN (March 13, 2007 11:08 AM)                  Chief Complaint:  disease management and fasting labs.  History of Present Illness: CPX  Current Allergies (reviewed today): No known allergies   Past Medical History:    Reviewed history from 11/23/2005 and no changes required:       Anxiety       Depression       GERD       Hyperlipidemia   Family History:    Reviewed history from 11/23/2005 and no changes required:       Father: MI age 53       Mother:   Social History:    Reviewed history from 11/23/2005 and no changes required:       Married       Alcohol use-yes       3 kids: one with schizophrenia    Review of Systems       no other complaints in a complete ROS    Physical Exam  General:     Well-developed,well-nourished,in no acute distress; alert,appropriate and cooperative throughout examination Head:     normocephalic and atraumatic.   Eyes:     pupils equal and pupils round.   Ears:     R ear normal and L ear normal.   Neck:     No deformities, masses, or tenderness noted. Chest Wall:     No deformities, masses, tenderness or gynecomastia noted. Lungs:     Normal respiratory effort, chest expands symmetrically. Lungs are clear to auscultation, no crackles or wheezes. Heart:     Normal rate and regular rhythm. S1 and S2 normal without gallop, murmur, click, rub or other extra sounds. Abdomen:     Bowel sounds  positive,abdomen soft and non-tender without masses, organomegaly or hernias noted. Msk:     No deformity or scoliosis noted of thoracic or lumbar spine.   Pulses:     R radial normal and L radial normal.   Extremities:     No clubbing, cyanosis, edema, or deformity noted       Impression & Recommendations:  Problem # 1:  PREVENTIVE HEALTH CARE (ICD-V70.0) health maint utd Orders: Venipuncture (86578) UA Dipstick w/o Micro (81002) TLB-BMP (Basic Metabolic Panel-BMET) (80048-METABOL) TLB-CBC Platelet - w/Differential (85025-CBCD) TLB-Hepatic/Liver Function Pnl (80076-HEPATIC) TLB-TSH (Thyroid Stimulating Hormone) (84443-TSH) TLB-PSA (Prostate Specific Antigen) (84153-PSA)   Complete Medication List: 1)  Lipitor 10 Mg Tabs (Atorvastatin calcium) .[name] Kitchen.. 1 by mouth once daily 2)  Aspir-low 81 Mg Tbec (Aspirin) .[name] Kitchen.. 1 by mouth once daily 3)  Flomax 0.4 Mg Cp24 (Tamsulosin hcl) .... Stopped---------------1 by mouth once daily----- 4)  Prevacid 30 Mg Cpdr (Lansoprazole) .... One by mouth daily 5)  Lexapro 10 Mg Tabs (Escitalopram oxalate) .... Take 1 tab by  mouth daily     ]Physical Exam General Appearance: well developed, well nourished, no acute distress Eyes: conjunctiva and lids normal, PERRL, EOMI, fundi WNL Ears, Nose, Mouth, Throat: TM clear, nares clear, oral exam WNL Neck: supple, no lymphadenopathy, no thyromegaly, no JVD Respiratory: clear to auscultation and percussion, respiratory effort normal Cardiovascular: regular rate and rhythm, S1-S2, no murmur, rub or gallop, no bruits, peripheral pulses normal and symmetric, no cyanosis, clubbing, edema or varicosities Chest: no scars, masses, tenderness; no asymmetry, skin changes, nipple discharge, no gynecomastia   Gastrointestinal: soft, non-tender; no hepatosplenomegaly, masses; active bowel sounds all quadrants,   Genitourinary: no hernia, testicular mass, penile discharge, priapism or prostate enlargement Lymphatic: no  cervical, axillary or inguinal adenopathy Musculoskeletal: gait normal, muscle tone and strength WNL, no joint swelling, effusions, discoloration, crepitus  Skin: clear, good turgor, color WNL, no rashes, lesions, or ulcerations Neurologic: normal mental status, normal reflexes, normal strength, sensation, and motion Psychiatric: alert; oriented to person, place and time     Laboratory Results   Urine Tests    Routine Urinalysis   Color: yellow Appearance: Clear Glucose: negative   (Normal Range: Negative) Bilirubin: negative   (Normal Range: Negative) Ketone: negative   (Normal Range: Negative) Spec. Gravity: 1.020   (Normal Range: 1.003-1.035) Blood: negative   (Normal Range: Negative) pH: 6.5   (Normal Range: 5.0-8.0) Protein: negative   (Normal Range: Negative) Urobilinogen: 0.2   (Normal Range: 0-1) Nitrite: negative   (Normal Range: Negative) Leukocyte Esterace: negative   (Normal Range: Negative)    Comments: ...................................................................Milica Zimonjic  March 13, 2007 1:33 PM

## 2010-03-22 NOTE — Miscellaneous (Signed)
Summary: Change PPI due to insurance  Clinical Lists Changes  Medications: Changed medication from ACIPHEX 20 MG TBEC (RABEPRAZOLE SODIUM) take one by mouth once daily to NEXIUM 40 MG CPDR (ESOMEPRAZOLE MAGNESIUM) take one by mouth once daily - Signed Rx of NEXIUM 40 MG CPDR (ESOMEPRAZOLE MAGNESIUM) take one by mouth once daily;  #30 x 6;  Signed;  Entered by: Harlow Mares CMA (AAMA);  Authorized by: Mardella Layman MD [facility];  Method [facility] [address] [phone]*, [address] [place], [place], [place]  [address], Ph: [phone] or [phone], Fax: [phone]    Prescriptions: NEXIUM 40 MG CPDR (ESOMEPRAZOLE MAGNESIUM) take one by mouth once daily  #30 x 6   Entered by:   Harlow Mares CMA (AAMA)   Authorized by:   Mardella Layman MD [facility]   Signed by:   Harlow Mares CMA (AAMA) on 01/18/2010   Method used:   [facility] to        [facility] [phone]* (retail)       [address]       [place], [place]  [address]       Ph: [phone] or [phone]       Fax: [phone]   RxID:   [id number]

## 2010-03-22 NOTE — Assessment & Plan Note (Signed)
Summary: emp/pt coming in fasting/cjr   Vital Signs:  Patient profile:   75 year old male Height:      69.5 inches Weight:      181 pounds BMI:     26.44 Temp:     97.8 degrees F oral BP sitting:   152 / 82  (left arm) Cuff size:   regular  Vitals Entered By: Sid Falcon LPN (November 29, 2009 8:15 AM)  Nutrition Counseling: Patient's BMI is greater than 25 and therefore counseled on weight management options.  History of Present Illness: cpx  has f/u with dr Jarold Motto for intermittent abdominal pain ?IBS?[name] Kitchen adequate relief with dicyclomine.   Current Problems (verified): 1)  Preventive Health Care  (ICD-V70.0) 2)  Benign Prostatic Hypertrophy, With Urinary Obstruction  (ICD-600.01) 3)  Hyperlipidemia  (ICD-272.4) 4)  Gerd  (ICD-530.81) 5)  Depression  (ICD-311) 6)  Anxiety  (ICD-300.00)  Current Medications (verified): 1)  Lipitor 10 Mg Tabs (Atorvastatin Calcium) .[name] Kitchen.. 1 By Mouth Once Daily 2)  Aspir-Low 81 Mg Tbec (Aspirin) .[name] Kitchen.. 1 By Mouth Once Daily 3)  Prevacid 30 Mg  Cpdr (Lansoprazole) .... One By Mouth Daily As Needed 4)  Fluoxetine Hcl 10 Mg  Tabs (Fluoxetine Hcl) .... 1/2-1 Tablet Once Daily As Needed 5)  Advil 200 Mg Tabs (Ibuprofen) .... Prn 6)  Lomotil 2.5-0.025 Mg Tabs (Diphenoxylate-Atropine) .... Take 1 Tablet By Mouth Once A Day As Needed For Frequent Bowel Syndromes. 7)  Bentyl 10 Mg  Caps (Dicyclomine Hcl) .[name] Kitchen.. 1 By Mouth Three Times A Day Before Meals 8)  Doxycycline Hyclate 100 Mg Tabs (Doxycycline Hyclate) .... As Needed  Allergies (verified): No Known Drug Allergies  Past History:  Past Medical History: Last updated: 11/23/2005 Anxiety Depression GERD Hyperlipidemia  Family History: Last updated: 11/23/2005 Father: MI age 78 Mother:   Social History: Last updated: 11/23/2005 Married Alcohol use-yes 3 kids: one with schizophrenia  Risk Factors: Smoking Status: never (11/23/2005)   Impression & Recommendations:  Problem # 1:   PREVENTIVE HEALTH CARE (ICD-V70.0)  health maint UTD advised continued exercise  Orders: UA Dipstick w/o Micro (automated)  (81003) Venipuncture (16109) TLB-Lipid Panel (80061-LIPID) TLB-BMP (Basic Metabolic Panel-BMET) (80048-METABOL) TLB-CBC Platelet - w/Differential (85025-CBCD) TLB-Hepatic/Liver Function Pnl (80076-HEPATIC) TLB-TSH (Thyroid Stimulating Hormone) (84443-TSH) TLB-PSA (Prostate Specific Antigen) (84153-PSA)  Problem # 2:  GERD (ICD-530.81) well controlled continue current medications  His updated medication list for this problem includes:    Prevacid 30 Mg Cpdr (Lansoprazole) ..... One by mouth daily as needed    Bentyl 10 Mg Caps (Dicyclomine hcl) .[name] Kitchen... 1 by mouth three times a day before meals  Problem # 3:  ANXIETY (ICD-300.00) he is doing quite well continue current medications  His updated medication list for this problem includes:    Fluoxetine Hcl 10 Mg Tabs (Fluoxetine hcl) .[name] Kitchen... 1/2-1 tablet once daily as needed  Complete Medication List: 1)  Lipitor 10 Mg Tabs (Atorvastatin calcium) .[name] Kitchen.. 1 by mouth once daily 2)  Aspir-low 81 Mg Tbec (Aspirin) .[name] Kitchen.. 1 by mouth once daily 3)  Prevacid 30 Mg Cpdr (Lansoprazole) .... One by mouth daily as needed 4)  Fluoxetine Hcl 10 Mg Tabs (Fluoxetine hcl) .... 1/2-1 tablet once daily as needed 5)  Advil 200 Mg Tabs (Ibuprofen) .... Prn 6)  Lomotil 2.5-0.025 Mg Tabs (Diphenoxylate-atropine) .... Take 1 tablet by mouth once a day as needed for frequent bowel syndromes. 7)  Bentyl 10 Mg Caps (Dicyclomine hcl) .[name] Kitchen.. 1 by mouth three times a day before meals  8)  Doxycycline Hyclate 100 Mg Tabs (Doxycycline hyclate) .... As needed  Preventive Care Screening  Colonoscopy:    Next Due:  11/2011  Physical Exam General Appearance: well developed, well nourished, no acute distress Eyes: conjunctiva and lids normal, PERRL, EOMI,  Ears, Nose, Mouth, Throat: TM clear, nares clear, oral exam WNL Neck: supple, no  lymphadenopathy, no thyromegaly, no JVD Respiratory: clear to auscultation and percussion, respiratory effort normal Cardiovascular: regular rate and rhythm, S1-S2, no murmur, rub or gallop, no bruits, peripheral pulses normal and symmetric, no cyanosis, clubbing, edema or varicosities Chest: no scars, masses, tenderness; no asymmetry, skin changes, nipple discharge, no gynecomastia   Gastrointestinal: soft, non-tender; no hepatosplenomegaly, masses; active bowel sounds all quadrants,  no masses, tenderness, hemorrhoids  Genitourinary: no hernia,  or prostate masses, prostate 3+ Lymphatic: no cervical, axillary or inguinal adenopathy Musculoskeletal: gait normal, muscle tone and strength WNL, no joint swelling, effusions, discoloration, crepitus  Skin: clear, good turgor, color WNL, no rashes, lesions, or ulcerations Neurologic: normal mental status, normal reflexes, normal strength, sensation, and motion Psychiatric: alert; oriented to person, place and time Other Exam:     Appended Document: Orders Update     Clinical Lists Changes  Orders: Added new Service order of Specimen Handling (60630) - Signed      Appended Document: emp/pt coming in fasting/cjr   Immunizations Administered:  Tetanus Vaccine:    Vaccine Type: Tdap    Site: left deltoid    Mfr: GlaxoSmithKline    Dose: 0.5 ml    Route: IM    Given by: Sid Falcon LPN    Exp. Date: 12/18/2011    VIS given: 01/08/08 version given November 29, 2009.  Appended Document: Orders Update     Clinical Lists Changes  Observations: Added new observation of COMMENTS: Wynona Canes, CMA  November 29, 2009 11:16 AM  (11/29/2009 11:15) Added new observation of PH URINE: 6.0  (11/29/2009 11:15) Added new observation of SPEC GR URIN: 1.015  (11/29/2009 11:15) Added new observation of APPEARANCE U: Clear  (11/29/2009 11:15) Added new observation of UA COLOR: yellow  (11/29/2009 11:15) Added new observation of WBC  DIPSTK U: negative  (11/29/2009 11:15) Added new observation of NITRITE URN: negative  (11/29/2009 11:15) Added new observation of UROBILINOGEN: 0.2  (11/29/2009 11:15) Added new observation of PROTEIN, URN: negative  (11/29/2009 11:15) Added new observation of BLOOD UR DIP: negative  (11/29/2009 11:15) Added new observation of KETONES URN: negative  (11/29/2009 11:15) Added new observation of BILIRUBIN UR: negative  (11/29/2009 11:15) Added new observation of GLUCOSE, URN: negative  (11/29/2009 11:15)      Laboratory Results   Urine Tests  Date/Time Recieved: November 29, 2009 11:15 AM  Date/Time Reported: November 29, 2009 11:15 AM   Routine Urinalysis   Color: yellow Appearance: Clear Glucose: negative   (Normal Range: Negative) Bilirubin: negative   (Normal Range: Negative) Ketone: negative   (Normal Range: Negative) Spec. Gravity: 1.015   (Normal Range: 1.003-1.035) Blood: negative   (Normal Range: Negative) pH: 6.0   (Normal Range: 5.0-8.0) Protein: negative   (Normal Range: Negative) Urobilinogen: 0.2   (Normal Range: 0-1) Nitrite: negative   (Normal Range: Negative) Leukocyte Esterace: negative   (Normal Range: Negative)    Comments: Wynona Canes, CMA  November 29, 2009 11:16 AM

## 2010-03-22 NOTE — Progress Notes (Signed)
Summary: LM to let pt know    Phone Note Call from Patient   Caller: Patient Call For: Dr Cato Mulligan Summary of Call: Pt. is worried and anxious about his son and would really like to try Lexapro before his appt with you in 2 weeks.  Has researched it and feels it will help him. [facility] [phone] Initial call taken by: Lynann Beaver CMA,  February 25, 2007 4:00 PM  Follow-up for Phone Call        lexapro 10 mg by mouth once daily #30/0refills  [facility] and left message for pt to call us back. ..................................................................[name] KitchenLynann Beaver CMA  February 25, 2007 4:40 PM Follow-up by: Birdie Sons MD,  February 25, 2007 4:05 PM  Additional Follow-up for Phone Call Additional follow up Details #1::        patient called to say thank you  Additional Follow-up by: Roselle Locus,  February 26, 2007 11:32 AM

## 2010-03-22 NOTE — Assessment & Plan Note (Signed)
History of Present Illness: Marvin Jones, Has 1-2 days of reflux symptoms monthly  Depression History:      The patient denies significant weight loss, significant weight gain, insomnia, hypersomnia, psychomotor agitation, psychomotor retardation, fatigue (loss of energy), feelings of worthlessness (guilt), impaired concentration (indecisiveness), and recurrent thoughts of death or suicide.  The patient denies symptoms of a manic disorder including persistently & abnormally elevated mood, abnormally & persistently irritable mood, less need for sleep, talkative or feels need to keep talking, distractibility, flight of ideas, increase in goal-directed activity, psychomotor agitation, inflated self-esteem or grandiosity, excessive buying sprees, excessive sexual indiscretions, and excessive foolish business investments.        Comments:  minimal anxiety.  Dyspepsia History:      He has no alarm features of dyspepsia including no history of melena, hematochezia, dysphagia, persistent vomiting, or involuntary weight loss > 5%.  There is a prior history of GERD.  He notes that he has never had an upper endoscopy or GI consultation.  No previous upper endoscopy has been done.    Lipid Management History:      Positive NCEP/ATP III risk factors include male age 40 years old or older.  Negative NCEP/ATP III risk factors include non-diabetic, non-tobacco-user status, non-hypertensive, no ASHD (atherosclerotic heart disease), no prior stroke/TIA, no peripheral vascular disease, and no history of aortic aneurysm.       Family History:    Father: MI age 49    Mother:   Social History:    Married    Alcohol use-yes    3 kids: one with schizophrenia  Risk Factors:  Alcohol use:  yes  Review of Systems  General      Denies fever, chills, sweats, anorexia, fatigue, weakness, malaise, weight loss, and sleep disorder.  Eyes      Denies vision loss - 1 eye, double vision, eye irritation, vision loss -  both eyes, blurring, eye pain, halos, discharge, and light sensitivity.  ENT      Denies ringing in the ears, ear discharge, earache, decreased hearing, nasal congestion, nosebleeds, difficulty swallowing, hoarseness, and sore throat.  CV      Denies difficulty breathing at night, near fainting, chest pain or discomfort, racing/skipping heart beats, fatigue, lightheadedness, shortness of breath with exertion, palpitations, swelling of hands or feet, difficulty breathing while lying down, fainting, leg cramps with exertion, bluish discoloration of lips or nails, and weight gain.  Resp      Denies sleep disturbances due to breathing, cough, shortness of breath, coughing up blood, chest discomfort, wheezing, excessive sputum, and excessive snoring.  GI      Denies excessive appetite, loss of appetite, indigestion, vomiting blood, nausea, vomiting, yellowish skin color, gas, abdominal pain, abdominal bloating, hemorrhoids, diarrhea, change in bowel habits, constipation, dark tarry stools, and bloody stools.  GU      Denies dysuria, hematuria, discharge, urinary frequency, urinary hesitancy, nocturia, incontinence, genital sores, decreased libido, and erectile dysfunction.  MS      Denies muscle cramps, joint pain, joint swelling, presence of joint fluid, back pain, stiffness, muscle weakness, arthritis, gout, loss of strength, and muscle aches.  Derm      Denies excessive perspiration, night sweats, suspicious lesions, changes in nail beds, dryness, poor wound healing, unusual hair distribution, skin cancer, itching, changes in color of skin, flushing, and rash.  Neuro      Denies difficulty with concentration, poor balance, headaches, disturbances in coordination, numbness, inability to speak, falling down, tingling, brief paralysis,  visual disturbances, seizures, weakness, sensation of room spinning, tremors, fainting, excessive daytime sleeping, and memory loss.  Psych      Denies sense of  great danger, anxiety, thoughts of suicide, mental problems, depression, thoughts of violence, and frightening visions or sounds.  Endo      Denies excessive hunger, cold intolerance, heat intolerance, excessive urination, excessive thirst, and weight change.  Heme      Denies enlarged lymph nodes, bleeding, skin discoloration, abnormal bruising, and fevers.  Allergy      Denies persistent infections, hives or rash, seasonal allergies, and HIV exposure.  Vital Signs:  Patient Profile:   75 Year Male Height:     69.5 inches Weight:      180.5 pounds Temp:     98.4 degrees F BP sitting:   148 / 84   Physical Exam  General:     well developed, well nourished, in no acute distress.   Head:     normocephalic and atraumatic.   Eyes:     PERRL/EOM intact, conjunctiva and sclera clear with out nystagmus.   Ears:     TM's intact and clear with normal canals with grossly normal hearing.   Nose:     no deformity, discharge, inflammation, or lesions.   Mouth:     no deformity or lesions with good dentition.   Neck:     no masses, thyromegaly, or abnormal cervical nodes.   Chest Wall:     no deformities or breast masses noted.   Lungs:     clear bilaterally to auscultation.   Heart:     non-displaced PMI, chest non-tender; regular rate and rhythm, S1, S2 without murmurs, rubs, or gallops Abdomen:      normal bowel sounds; no hepatosplenomegaly no ventral,umbilical hernias or masses noted.   Rectal:     normal external exam.   Genitalia:     normal male, testes descended bilaterally without masses, no hernias, no varicoceles noted.   Msk:     no deformity or scoliosis noted of thoracic or lumbar spine.   Pulses:     pulses normal in all 4 extremities.   Extremities:     no clubbing, cyanosis, edema, or deformity noted with normal full range of motion of all joints.   Neurologic:     no focal deficits, cranial nerves II-XII grossly intact with normal sensation, reflexes,  coordination, muscle stregnth and tone.   Skin:     intact without lesions or rashes.   Cervical Nodes:     no significant adenopathy.   Axillary Nodes:     no significant adenopathy.   Inguinal Nodes:     no significant adenopathy.   Psych:     alert and cooperative; normal mood and affect; normal attention span and concentration.     Impression & Recommendations:  Problem # 1:  HYPERLIPIDEMIA (ICD-272.4)  His updated medication list for this problem includes:    Lipitor 10 Mg Tabs (Atorvastatin calcium) .[name] Kitchen... 1 by mouth once daily check liver and liped profile  Problem # 2:  GERD (ICD-530.81) he will discuss egd with Dr. Jarold Motto as needed use of ppi ok  Problem # 3:  DEPRESSION (ICD-311) no need to treat at this time  Problem # 4:  Screening PSA (ICD-V76.44) psa today  Problem # 5:  BENIGN PROSTATIC HYPERTROPHY, WITH URINARY OBSTRUCTION (ICD-600.01) flomax  Medications Added to Medication List This Visit: 1)  Flomax 0.4 Mg Cp24 (Tamsulosin hcl) .[name] Kitchen.. 1 by mouth  qd  Lipid Assessment/Plan:      Based on NCEP/ATP III, the patient's risk factor category is "0-1 risk factors".  From this information, the patient's calculated lipid goals are as follows: Total cholesterol goal is 200; LDL cholesterol goal is 160; HDL cholesterol goal is 40; Triglyceride goal is 150.    Preventive Care Screening  Last Pneumovax:    Date:  11/23/2005    Results:  given  Last Flu Shot:    Date:  11/23/2005    Results:  given

## 2010-03-22 NOTE — Miscellaneous (Signed)
Summary: Orders Update rx for carafate  Clinical Lists Changes  Medications: Added new medication of CARAFATE 1 GM/10ML  SUSP (SUCRALFATE) 5 ml pc and hs for several days - Signed Rx of CARAFATE 1 GM/10ML  SUSP (SUCRALFATE) 5 ml pc and hs for several days;  #1 pint x 2;  Signed;  Entered by: Weston Brass RN;  Authorized by: Mardella Layman MD [facility];  Method used: [facility] [address] [phone]*, [address], [place], [place], [place]  [address], Ph: [phone] or [phone], Fax: [phone] Observations: Added new observation of NKA: T (01/17/2010 7:55)    Prescriptions: CARAFATE 1 GM/10ML  SUSP (SUCRALFATE) 5 ml pc and hs for several days  #1 pint x 2   Entered by:   Weston Brass RN   Authorized by:   Mardella Layman MD [facility]   Signed by:   Weston Brass RN on 01/17/2010   Method used:   Electronically to        [facility] [address] [phone]* (retail)       [address]       [place], [place]  [address]       Ph: [phone] or [phone]       Fax: [phone]   RxID:   [id number]

## 2010-03-22 NOTE — Progress Notes (Signed)
Summary: Klonipin request  Phone Note Call from Patient Call back at [phone]   Caller: live Call For: swords Summary of Call: Requests #10-20 average dose Klonipin, a relaxer that you gave him before, because of his 2 children that have major problems.  Knows he needs ov & is scheduling CPX soon.  [facility]  [facility]. Initial call taken by: Rudy Jew, RN,  Jun 22, 2008 3:49 PM  Follow-up for Phone Call        klonopin 1 mg  1/2-1 by mouth every other day as needed #20/1 refill Follow-up by: Birdie Sons MD,  Jun 22, 2008 9:19 PM  Additional Follow-up for Phone Call Additional follow up Details #1::        Patient advised and Rx sent to pharm.    New/Updated Medications: KLONOPIN 1 MG TABS (CLONAZEPAM) 1/2 to 1 tablet by mouth every other day as needed   Prescriptions: KLONOPIN 1 MG TABS (CLONAZEPAM) 1/2 to 1 tablet by mouth every other day as needed  #20 x 1   Entered by:   Darra Lis RMA   Authorized by:   Birdie Sons MD   Signed by:   Darra Lis RMA on 06/23/2008   Method used:   Telephoned to ...       [facility] [address] [phone]* (retail)       [address]       [place], [place]  [address]       Ph: [phone] or [phone]       Fax: [phone]   RxID:   [id number]

## 2010-03-22 NOTE — Miscellaneous (Signed)
Summary: Orders Update Clotest  Clinical Lists Changes  Orders: Added new Test order of TLB-H Pylori Screen Gastric Biopsy (83013-CLOTEST) - Signed 

## 2010-03-22 NOTE — Assessment & Plan Note (Signed)
Summary: temp 102, /dm   Vital Signs:  Patient Profile:   75 Years Old Male Height:     69.5 inches (176.53 cm) Weight:      182 pounds O2 Sat:      95 % Temp:     99.0 degrees F Pulse rate:   68 / minute BP sitting:   130 / 70  (left arm) Cuff size:   regular  Vitals Entered By: Pura Spice, RN (April 14, 2008 4:12 PM)                 Chief Complaint:  Acheing. Temp last night 101.0. Had chills very bad last night..  History of Present Illness: This 75 yr old male had onset aching, fever, chills last night, temp 101 degrees mild headache no cough, sore throat, no GI nor GU symptoms    Current Allergies: No known allergies      Review of Systems      See HPI  General      Complains of chills, fever, and malaise.      aching  ENT      Denies decreased hearing, difficulty swallowing, ear discharge, earache, hoarseness, nasal congestion, nosebleeds, postnasal drainage, ringing in ears, sinus pressure, and sore throat.  CV      Denies bluish discoloration of lips or nails, chest pain or discomfort, difficulty breathing at night, difficulty breathing while lying down, fainting, fatigue, leg cramps with exertion, lightheadness, near fainting, palpitations, shortness of breath with exertion, swelling of feet, swelling of hands, and weight gain.  Resp      Denies chest discomfort, chest pain with inspiration, cough, coughing up blood, excessive snoring, hypersomnolence, morning headaches, pleuritic, shortness of breath, sputum productive, and wheezing.  GI      Denies abdominal pain, bloody stools, change in bowel habits, constipation, dark tarry stools, diarrhea, excessive appetite, gas, hemorrhoids, indigestion, loss of appetite, nausea, vomiting, vomiting blood, and yellowish skin color.  GU      Denies decreased libido, discharge, dysuria, erectile dysfunction, genital sores, hematuria, incontinence, nocturia, urinary frequency, and urinary  hesitancy.   Physical Exam  General:     Well-developed,well-nourished,in no acute distress; alert,appropriate and cooperative throughout examination Head:     Normocephalic and atraumatic without obvious abnormalities. No apparent alopecia or balding. Eyes:     No corneal or conjunctival inflammation noted. EOMI. Perrla. Funduscopic exam benign, without hemorrhages, exudates or papilledema. Vision grossly normal. Ears:     External ear exam shows no significant lesions or deformities.  Otoscopic examination reveals clear canals, tympanic membranes are intact bilaterally without bulging, retraction, inflammation or discharge. Hearing is grossly normal bilaterally. Nose:     External nasal examination shows no deformity or inflammation. Nasal mucosa are pink and moist without lesions or exudates. Mouth:     Oral mucosa and oropharynx without lesions or exudates.  Teeth in good repair. Lungs:     minimal rough breathe sounds,no dullness, no fremitus, no crackles, and no wheezes.   Heart:     Normal rate and regular rhythm. S1 and S2 normal without gallop, murmur, click, rub or other extra sounds. Abdomen:     Bowel sounds positive,abdomen soft and non-tender without masses, organomegaly or hernias noted.    Impression & Recommendations:  Problem # 1:  FEVER, HX OF (ICD-V15.9) Assessment: New  Orders: TLB-CBC Platelet - w/Differential (85025-CBCD)   Complete Medication List: 1)  Lipitor 10 Mg Tabs (Atorvastatin calcium) .[name] Kitchen.. 1 by mouth once daily  2)  Aspir-low 81 Mg Tbec (Aspirin) .[name] Kitchen.. 1 by mouth once daily 3)  Flomax 0.4 Mg Cp24 (Tamsulosin hcl) .... Stopped---------------1 by mouth once daily----- 4)  Prevacid 30 Mg Cpdr (Lansoprazole) .... One by mouth daily 5)  Fluoxetine Hcl 10 Mg Tabs (Fluoxetine hcl) .... 1/2-1 tablet once daily 6)  Hydrocodone-acetaminophen 10-500 Mg Tabs (Hydrocodone-acetaminophen) .[name] Kitchen.. 1 q4h as needed aching pain or fever  Other Orders: Venipuncture  (16109)   Patient Instructions: 1)  impression viral infection 2)  urinalysis  neg  3)  to tx with tylenol, increass fluid intake 4)  call if any change 5)  get hydrocodone acetominophen prrescription if you desire   Prescriptions: HYDROCODONE-ACETAMINOPHEN 10-500 MG TABS (HYDROCODONE-ACETAMINOPHEN) 1 q4h as needed aching pain or fever  #50 x 0   Entered and Authorized by:   Judithann Sheen MD   Signed by:   Judithann Sheen MD on 04/14/2008   Method used:   Print then Give to Patient   RxID:   [id number]

## 2010-03-24 NOTE — Progress Notes (Signed)
Summary: Stomach grumbles   Phone Note Call from Patient Call back at Home Phone [phone]   Call For: Dr Jarold Motto Summary of Call: His stomach growls & gurgles. Is using alot of Pepto Bismul and just wonders how much Pepto is ok Initial call taken by: Leanor Kail [facility],  February 09, 2010 8:28 AM  Follow-up for Phone Call        Patient wants Dr Jarold Motto to know his swallowing is much better.  He c/o frequent BM, not diarrhea.  He is taking Dicyclomine two times a day and Pepto 2 times a day.  He says that he takes pepto two times a day because of a lower GI tract irritation.  His symptoms are well cotrolled on pepto and dicyclomine.  He wonders if this is ok to take daily on a long term basis.  he does have a follow up appointment with Dr Jarold Motto in January.  Dr Jarold Motto please advise Follow-up by: Darcey Nora RN, CGRN,  February 09, 2010 9:26 AM  Additional Follow-up for Phone Call Additional follow up Details #1::        renew bentyl Additional Follow-up by: Mardella Layman MD Clementeen Graham,  February 09, 2010 9:41 AM    Additional Follow-up for Phone Call Additional follow up Details #2::    Patient  advised that he needs to keep his appointment with Dr Jarold Motto for january.  I did clarify with Dr. Jarold Motto that ok to take pepto two times a day along with bentyl.  Follow-up by: Darcey Nora RN, CGRN,  February 09, 2010 9:55 AM  Prescriptions: BENTYL 10 MG  CAPS (DICYCLOMINE HCL) 1 by mouth three times a day before meals  #90 x 0   Entered by:   Darcey Nora RN, CGRN   Authorized by:   Mardella Layman MD [facility]   Signed by:   Darcey Nora RN, CGRN on 02/09/2010   Method used:   Electronically to        [facility] [address] [phone]* (retail)       [address]       [place], [place]  [address]       Ph: [phone] or [phone]       Fax: [phone]   RxID:   [id number]

## 2010-03-24 NOTE — Progress Notes (Signed)
Summary: Questions   Phone Note Call from Patient Call back at [phone]   Caller: Patient Call For: Dr. Jarold Motto Reason for Call: Talk to Nurse Summary of Call: Having some burning in his colon Initial call taken by: Karna Christmas,  March 18, 2010 10:17 AM  Follow-up for Phone Call        Per Dr. Jarold Motto order CT abd/pelvis. States that he is having a slow burn in his stomach constantly. avoid friday. Do Librax three times a day stop Bentyl Follow-up by: Harlow Mares CMA Duncan Dull),  March 18, 2010 12:15 PM  Additional Follow-up for Phone Call Additional follow up Details #1::        CT closed for lunch I will call back when they return Additional Follow-up by: Harlow Mares CMA Duncan Dull),  March 18, 2010 12:37 PM    Additional Follow-up for Phone Call Additional follow up Details #2::    CT scheduled and labs ordered pt aware he will need to come monday to get labs. rx sent to pharm. Follow-up by: Harlow Mares CMA (AAMA),  March 18, 2010 2:03 PM  New/Updated Medications: LIBRAX 2.5-5 MG CAPS (CLIDINIUM-CHLORDIAZEPOXIDE) take one by mouth two times a day Prescriptions: LIBRAX 2.5-5 MG CAPS (CLIDINIUM-CHLORDIAZEPOXIDE) take one by mouth two times a day  #60 x 3   Entered by:   Harlow Mares CMA (AAMA)   Authorized by:   Mardella Layman MD [facility]   Signed by:   Harlow Mares CMA (AAMA) on 03/18/2010   Method used:   Electronically to        [facility] [address] [phone]* (retail)       [address]       [place], [place]  [address]       Ph: [phone] or [phone]       Fax: [phone]   RxID:   [id number]

## 2010-03-24 NOTE — Assessment & Plan Note (Signed)
Summary: follow up after EGD/lk    History of Present Illness Visit Type: new patient  Primary GI MD: Sheryn Bison MD FACP FAGA Primary Provider: Birdie Sons, MD  Requesting Provider: na Chief Complaint: F/u from EGD. Pt c/o lower abd pain with burning, and feeling that he is not completely emptying when he has BMs   History of Present Illness:   Marvin Jones is currently asymptomatic on Nexium 40 mg a day. He had severe dysphagia, and endoscopy showed Barrett's mucosa with a peptic stricture that was dilated. He also continues with IBS-type complaints, his colonoscopy revealed mild diverticulosis. There is no history of rectal bleeding since his polypectomy. His appetite is good and his weight is stable, and he denies any hepatobiliary or systemic complaints. He does not smoke or abuse ethanol or NSAIDs. He has mild chronic depression associated with a mentally challenged son,but denies need for antidepressant medication. He does take a daily aspirin tablet for cardiovascular prophylaxis and is followed by Dr. Birdie Sons.    GI Review of Systems    Reports abdominal pain.     Location of  Abdominal pain: lower abdomen.    Denies acid reflux, belching, bloating, chest pain, dysphagia with liquids, dysphagia with solids, heartburn, loss of appetite, nausea, vomiting, vomiting blood, weight loss, and  weight gain.      Reports irritable bowel syndrome.     Denies anal fissure, black tarry stools, change in bowel habit, constipation, diarrhea, diverticulosis, fecal incontinence, heme positive stool, hemorrhoids, jaundice, light color stool, liver problems, rectal bleeding, and  rectal pain.    Current Medications (verified): 1)  Lipitor 10 Mg Tabs (Atorvastatin Calcium) .[name] Kitchen.. 1 By Mouth Once Daily 2)  Aspir-Low 81 Mg Tbec (Aspirin) .[name] Kitchen.. 1 By Mouth Once Daily 3)  Fluoxetine Hcl 10 Mg  Tabs (Fluoxetine Hcl) .... 1/2-1 Tablet Once Daily As Needed 4)  Advil 200 Mg Tabs (Ibuprofen) .... Prn 5)   Lomotil 2.5-0.025 Mg Tabs (Diphenoxylate-Atropine) .... As Needed For Frequent Bowel Syndromes. 6)  Bentyl 10 Mg  Caps (Dicyclomine Hcl) .... As Needed 7)  Nexium 40 Mg Cpdr (Esomeprazole Magnesium) .... Take One By Mouth Once Daily 8)  Pepto-Bismol 524 Mg/52ml Susp (Bismuth Subsalicylate) .... As Needed  Allergies (verified): No Known Drug Allergies  Past History:  Past medical, surgical, family and social histories (including risk factors) reviewed for relevance to current acute and chronic problems.  Past Medical History: IRRITABLE BOWEL SYNDROME (ICD-564.1) BARRETTS ESOPHAGUS (ICD-530.85) ESOPHAGEAL STRICTURE (ICD-530.3) HIATAL HERNIA WITH REFLUX (ICD-553.3) DIVERTICULOSIS, COLON (ICD-562.10) COLONIC POLYPS, ADENOMATOUS, HX OF (ICD-V12.72) PREVENTIVE HEALTH CARE (ICD-V70.0) BENIGN PROSTATIC HYPERTROPHY, WITH URINARY OBSTRUCTION (ICD-600.01) HYPERLIPIDEMIA (ICD-272.4) GERD (ICD-530.81) DEPRESSION (ICD-311) ANXIETY (ICD-300.00)  Past Surgical History: Unremarkable  Family History: Reviewed history from 11/23/2005 and no changes required. Father: MI age 4  No FH of Colon Cancer:  Social History: Reviewed history from 11/23/2005 and no changes required. Retired Married Alcohol use-yes 3 kids: one with schizophrenia Daily Caffeine Use: Coffee daily  Cigar Occ   Review of Systems       The patient complains of allergy/sinus and arthritis/joint pain.  The patient denies anemia, anxiety-new, back pain, blood in urine, breast changes/lumps, change in vision, confusion, cough, coughing up blood, depression-new, fainting, fatigue, fever, headaches-new, hearing problems, heart murmur, heart rhythm changes, itching, menstrual pain, muscle pains/cramps, night sweats, nosebleeds, pregnancy symptoms, shortness of breath, skin rash, sleeping problems, sore throat, swelling of feet/legs, swollen lymph glands, thirst - excessive , urination - excessive , urination  changes/pain, urine  leakage, vision changes, and voice change.    Vital Signs:  Patient profile:   75 year old male Height:      69.5 inches Weight:      180 pounds BMI:     26.29 BSA:     1.99 Pulse rate:   60 / minute Pulse rhythm:   irregular BP sitting:   128 / 80  (left arm) Cuff size:   regular  Vitals Entered By: Ok Anis CMA (March 04, 2010 10:05 AM)  Physical Exam  General:  Well developed, well nourished, no acute distress.healthy appearing.   Head:  Normocephalic and atraumatic. Eyes:  PERRLA, no icterus. Lungs:  Clear throughout to auscultation. Heart:  Regular rate and rhythm; no murmurs, rubs,  or bruits. Abdomen:  Soft, nontender and nondistended. No masses, hepatosplenomegaly or hernias noted. Normal bowel sounds. Extremities:  No clubbing, cyanosis, edema or deformities noted. Neurologic:  Alert and  oriented x4;  grossly normal neurologically. Psych:  Alert and cooperative. Normal mood and affect.   Impression & Recommendations:  Problem # 1:  BARRETTS ESOPHAGUS (ICD-530.85) Assessment Unchanged Endoscopy with dysplasia screening every 3 years. He is to continue his reflux maneuvers with daily PPI therapy and p.r.n. H2 blocker therapy at bedtime as needed. He is to advise [place] immediately should his dysphagia return.  Problem # 2:  HIATAL HERNIA WITH REFLUX (ICD-553.3) Assessment: Improved Reflex Regime Reviewed would continue daily PPI therapy.  Problem # 3:  DIVERTICULOSIS, COLON (ICD-562.10) Assessment: Unchanged I have advised him to follow a high fiber diet, use daily Benefiber, and p.r.n. dicyclomine.  Problem # 4:  COLONIC POLYPS, ADENOMATOUS, HX OF (ICD-V12.72) Assessment: Unchanged Followup colonoscopy next year as per clinical protocol. Patient does have a history of multiple adenomatous colon polyps. I am worried about possible compliance issues.  Problem # 5:  BENIGN PROSTATIC HYPERTROPHY, WITH URINARY OBSTRUCTION (ICD-600.01) Assessment:  Improved continued urologic followup as needed.  Patient Instructions: 1)  Copy sent to : Birdie Sons, MD  2)  High Fiber, Low Fat  Healthy Eating Plan brochure given.  3)  Today you watched the movie on diverticular disease. 4)  Please continue current medications.  5)  Your prescription(s) have been sent to you pharmacy.  6)  The medication list was reviewed and reconciled.  All changed / newly prescribed medications were explained.  A complete medication list was provided to the patient / caregiver. 7)  Avoid foods high in acid content ( tomatoes, citrus juices, spicy foods) . Avoid eating within 3 to 4 hours of lying down or before exercising. Do not over eat; try smaller more frequent meals. Elevate head of bed four inches when sleeping.  8)  colonoscopy due in January of 2013. We probably will repeat his endoscopy with Barrett's dysplasia biopsies at that time. Prescriptions: BENTYL 10 MG  CAPS (DICYCLOMINE HCL) as needed  #60 x 3   Entered by:   Harlow Mares CMA (AAMA)   Authorized by:   Mardella Layman MD [facility]   Signed by:   Harlow Mares CMA (AAMA) on 03/04/2010   Method used:   Electronically to        [facility] [address] [phone]* (retail)       [address]       [place], [place]  [address]       Ph: [phone] or [phone]       Fax: [phone]  RxID:   [id number] NEXIUM 40 MG CPDR (ESOMEPRAZOLE MAGNESIUM) take one by mouth once daily  #30 x 6   Entered by:   Harlow Mares CMA (AAMA)   Authorized by:   Mardella Layman MD [facility]   Signed by:   Harlow Mares CMA (AAMA) on 03/04/2010   Method used:   Electronically to        [facility] [address] [phone]* (retail)       [address]       [place], [place]  [address]       Ph: [phone] or [phone]       Fax: [phone]   RxID:   [id number]

## 2010-03-24 NOTE — Progress Notes (Signed)
Summary: questions   Phone Note Outgoing Call Call back at [facility] Phone [phone]   Call placed by: Harlow Mares CMA Duncan Dull),  March 15, 2010 8:56 AM Call placed to: Patient Summary of Call: complains of lower abdominal burning 2/3 of the time. States that he is taking pepto once to twice a day just 2-3 sips. He is taking bentyl as needed and taking his nexium and they seem to be working he was just wanting to know about a a test he saw on the movie he watched last time he was here, which was a barrium Enema. I also wanted to know if he had a Cancer would it show up on a colonoscopy. I advised him yes and he said he was not worried but just asking. He states that he will talk with Dr. Jarold Motto at the [facility] game. and if he wants to schedule anything he will call back . Initial call taken by: Harlow Mares CMA (AAMA),  March 15, 2010 9:02 AM

## 2010-03-26 ENCOUNTER — Encounter: Payer: Self-pay | Admitting: Gastroenterology

## 2010-04-20 ENCOUNTER — Ambulatory Visit (INDEPENDENT_AMBULATORY_CARE_PROVIDER_SITE_OTHER): Payer: Medicare Other | Admitting: Internal Medicine

## 2010-04-20 ENCOUNTER — Encounter: Payer: Self-pay | Admitting: Internal Medicine

## 2010-04-20 DIAGNOSIS — K219 Gastro-esophageal reflux disease without esophagitis: Secondary | ICD-10-CM

## 2010-04-20 DIAGNOSIS — R1084 Generalized abdominal pain: Secondary | ICD-10-CM

## 2010-04-28 NOTE — Assessment & Plan Note (Signed)
Summary: Second opinion on abdominal pain and burning    History of Present Illness Visit Type: Follow-up Visit Primary GI MD: Sheryn Bison MD FACP FAGA Primary Provider: Birdie Sons, MD  Requesting Provider: na Chief Complaint: Second opinoin to discuss abdominal pain discomfort History of Present Illness:    75 year old white male, personal acquaintance , and regular patient of Dr. Jarold Motto, who presents today for a second opinion regarding lower abdominal discomfort. He has a history of gastroesophageal reflux disease complicated by peptic stricture , Barrett's esophagus , irritable bowel syndrome, and anxiety/depression. the patient reports to me a 3 year history lower abdominal discomfort. This is described as "a slow burning ". Also associated with the need to defecate on an urgent basis. No diarrhea or bleeding. He has some  degree of discomfort nearly every day. More prominent  and constant intermittently for a day or so. Symptoms may be exacerbated by excessive roughage or alcohol. Symptoms seem to be improved with activity or exercise. Transiently improved with defecation. No urinary component , though he has BPH.. The discomfort does not affect his activities or disrupt sleep. He did undergo colonoscopy in 2008 which revealed tubular adenomas and left-sided diverticulosis. His most recent upper endoscopy, for dysphagia was performed in November 2011. Esophageal dilation at that time. He is on Nexium with good control of GERD and no recurrence of dysphagia. He does say that dicyclomine helps. He takes is 3-4 times per week. Also, Pepto-Bismol seems to help. Fluoxetine is on his medication list, though he states he has not had this in over 2 years. He was prescribed Librax, but did not fill at due to excessive cost. Also, a CT scan of the abdomen and pelvis tentatively planned, but not performed due to cost.. Overall, he states that he has been doing better over the past few weeks.  Laboratories from October 2011 were normal including CBC, comprehensive metabolic panel, and TSH. The possibility of underlying cancer does concern him.   GI Review of Systems    Reports abdominal pain, acid reflux, and  belching.      Denies bloating, chest pain, dysphagia with liquids, dysphagia with solids, heartburn, loss of appetite, nausea, vomiting, vomiting blood, weight loss, and  weight gain.        Denies anal fissure, black tarry stools, change in bowel habit, constipation, diarrhea, diverticulosis, fecal incontinence, heme positive stool, hemorrhoids, irritable bowel syndrome, jaundice, light color stool, liver problems, rectal bleeding, and  rectal pain.    Current Medications (verified): 1)  Lipitor 10 Mg Tabs (Atorvastatin Calcium) .[name] Kitchen.. 1 By Mouth Once Daily 2)  Aspir-Low 81 Mg Tbec (Aspirin) .[name] Kitchen.. 1 By Mouth Once Daily 3)  Fluoxetine Hcl 10 Mg  Tabs (Fluoxetine Hcl) .... 1/2-1 Tablet Once Daily As Needed 4)  Advil 200 Mg Tabs (Ibuprofen) .... Prn 5)  Nexium 40 Mg Cpdr (Esomeprazole Magnesium) .... Take One By Mouth Once Daily 6)  Pepto-Bismol 524 Mg/78ml Susp (Bismuth Subsalicylate) .... As Needed 7)  Benefiber  Powd (Wheat Dextrin) .... Take Daily 8)  Dicyclomine Hcl 10 Mg Caps (Dicyclomine Hcl) .... As Needed  Allergies (verified): No Known Drug Allergies  Past History:  Past Medical History: Reviewed history from 03/04/2010 and no changes required. IRRITABLE BOWEL SYNDROME (ICD-564.1) BARRETTS ESOPHAGUS (ICD-530.85) ESOPHAGEAL STRICTURE (ICD-530.3) HIATAL HERNIA WITH REFLUX (ICD-553.3) DIVERTICULOSIS, COLON (ICD-562.10) COLONIC POLYPS, ADENOMATOUS, HX OF (ICD-V12.72) PREVENTIVE HEALTH CARE (ICD-V70.0) BENIGN PROSTATIC HYPERTROPHY, WITH URINARY OBSTRUCTION (ICD-600.01) HYPERLIPIDEMIA (ICD-272.4) GERD (ICD-530.81) DEPRESSION (ICD-311) ANXIETY (ICD-300.00)  Past Surgical  History: Reviewed history from 03/04/2010 and no changes  required. Unremarkable  Family History: Reviewed history from 03/04/2010 and no changes required. Father: MI age 72  No FH of Colon Cancer:  Social History: Retired Married Alcohol use-yes  [place] 3 kids: one with schizophrenia Daily Caffeine Use: Coffee daily  3 cups per day Cigar Occ   Review of Systems       The patient complains of arthritis/joint pain.  The patient denies allergy/sinus, anemia, anxiety-new, back pain, blood in urine, breast changes/lumps, change in vision, confusion, cough, coughing up blood, depression-new, fainting, fatigue, fever, headaches-new, hearing problems, heart murmur, heart rhythm changes, itching, muscle pains/cramps, night sweats, nosebleeds, shortness of breath, skin rash, sleeping problems, sore throat, swelling of feet/legs, swollen lymph glands, thirst - excessive, urination - excessive, urination changes/pain, urine leakage, vision changes, and voice change.    Vital Signs:  Patient profile:   75 year old male Height:      69.5 inches Weight:      179 pounds BMI:     26.15 BSA:     1.98 Pulse rate:   60 / minute Pulse rhythm:   regular BP sitting:   130 / 68  (left arm)  Vitals Entered By: Merri Ray CMA Duncan Dull) (April 20, 2010 8:51 AM)  Physical Exam  General:  Well developed, well nourished, no acute distress. Head:  Normocephalic and atraumatic. Eyes:  PERRLA, no icterus. Nose:  No deformity, discharge,  or lesions. Mouth:  No deformity or lesions, dentition normal. Neck:  Supple; no masses or thyromegaly. Lungs:  Clear throughout to auscultation. Heart:  Regular rate and rhythm; no murmurs, rubs,  or bruits. Abdomen:  Soft, nontender and nondistended. No masses, hepatosplenomegaly or hernias noted. Normal bowel sounds. Msk:  Symmetrical with no gross deformities. Normal posture. Pulses:  Normal pulses noted. Extremities:  No clubbing, cyanosis, edema or deformities noted. Neurologic:  Alert and  oriented x4;  grossly  normal neurologically. Skin:  Intact without significant lesions or rashes. Psych:  Alert and cooperative. Normal mood and affect.   Impression & Recommendations:  Problem # 1:  ABDOMINAL PAIN -GENERALIZED (ICD-789.07)  Chronic functional abdominal pain as described. We discussed this entity in detail ( counseled for greater than 30 minutes) as well as provided him with accompanying literature. I gave him the option of proceeding with a CT scan to be absolutely sure there is no other pathology , as well as  provide possible reassurance. he is comfortable and holding off at this time.   Plan  #1. If , his condition worsens, we could reconsider imaging..    #2. he will continue on when necessary bismuth and dicyclomine as this helps.  #3. in terms of possible improved or more sustained relief, we discussed SSRI therapy. he is interested in proceeding. I would like to start him off on low-dose Zoloft , 25 mg at night. Continue for 6 weeks if tolerated with planned followup at that time. We may adjust the dose up toward if appropriate.  #4. I will see him back at  in 6 weeks.[name] Kitchen He knows to contact the office in the interim for questions or problems  Problem # 2:  ESOPHAGEAL STRICTURE (ICD-530.3)  peptic stricture secondary to GERD  (530.81 ) with evidence of Barrett's( 530.85). currently asymptomatic post dilation on Nexium.   Plan : #1. continue Nexium #2. followup when necessary recurrent dysphasia , with Dr. Jarold Motto  #3. relook endoscopy with biopsies as deemed necessary by Dr. Jarold Motto  Problem # 3:  COLONIC POLYPS, ADENOMATOUS, HX OF (ICD-V12.72)  history of adenomatous colon polyps November 2008. scheduled for routine followup around November 2013. keep that anniversary date with Dr. Jarold Motto as planned.  Patient Instructions: 1)  Sertraline 25 mg 1 by mouth two times a day #60 sent to your pharmacy for you to pick up. 2)  Please schedule a follow-up appointment in 6 weeks.  3)   Copy sent to : Birdie Sons, MD  4)  The medication list was reviewed and reconciled.  All changed / newly prescribed medications were explained.  A complete medication list was provided to the patient / caregiver. Prescriptions: SERTRALINE HCL 25 MG TABS (SERTRALINE HCL) 1 by mouth two times a day  #60 x 3   Entered by:   Milford Cage NCMA   Authorized by:   Hilarie Fredrickson MD   Signed by:   Milford Cage NCMA on 04/20/2010   Method used:   [facility] to        [facility] [address] [phone]* (retail)       [address]       [place], [place]  [address]       Ph: [phone] or [phone]       Fax: [phone]   RxID:   [id number]   Appended Document: Second opinion on abdominal pain and burning thanks.[name] KitchenMarland Kitchen

## 2010-05-30 ENCOUNTER — Telehealth: Payer: Self-pay | Admitting: Internal Medicine

## 2010-05-30 NOTE — Telephone Encounter (Signed)
FYI Dr. Perry. 

## 2010-06-01 ENCOUNTER — Ambulatory Visit: Payer: Medicare Other | Admitting: Internal Medicine

## 2010-06-28 ENCOUNTER — Ambulatory Visit (INDEPENDENT_AMBULATORY_CARE_PROVIDER_SITE_OTHER): Payer: Medicare Other | Admitting: Internal Medicine

## 2010-06-28 ENCOUNTER — Encounter: Payer: Self-pay | Admitting: Internal Medicine

## 2010-06-28 ENCOUNTER — Telehealth: Payer: Self-pay | Admitting: *Deleted

## 2010-06-28 DIAGNOSIS — S90129A Contusion of unspecified lesser toe(s) without damage to nail, initial encounter: Secondary | ICD-10-CM

## 2010-06-28 DIAGNOSIS — IMO0002 Reserved for concepts with insufficient information to code with codable children: Secondary | ICD-10-CM

## 2010-06-28 DIAGNOSIS — M545 Low back pain, unspecified: Secondary | ICD-10-CM

## 2010-06-28 NOTE — Telephone Encounter (Signed)
Pt is complaining of "blue toe", and slightly painful.  Scheduled appt with Dr. Rodena Medin.

## 2010-07-03 DIAGNOSIS — M545 Low back pain, unspecified: Secondary | ICD-10-CM | POA: Insufficient documentation

## 2010-07-03 DIAGNOSIS — IMO0002 Reserved for concepts with insufficient information to code with codable children: Secondary | ICD-10-CM | POA: Insufficient documentation

## 2010-07-03 NOTE — Progress Notes (Signed)
  Subjective:    Patient ID: Marvin Jones, male    DOB: Oct 13, 1935, 75 y.o.   MRN: [id number]  HPI Pt presents to clinic for evaluation of toe discoloration. Notes recent dark discoloration under left toe nail for several days. Occurred after wearing new boots for several hours.No direct trauma or injury however appears the distal toenail may have been under pressure from boot tip. No significant pain or there has been no discoloration of skin itself. Has not worsened and the nail has not loosened. Denies ST swelling or difficulty weight bearing. Also has had recent onset of LBP without radicular pain, numbness, tingling, or weakness. No injury or trauma. No exacerbating or alleviating factors. No other complaints.   Reviewed pmh, medications and allergies.    Review of Systems see hpi     Objective:   Physical Exam  Nursing note and vitals reviewed. Constitutional: He appears well-developed and well-nourished. No distress.  HENT:  Head: Normocephalic and atraumatic.  Right Ear: External ear normal.  Left Ear: External ear normal.  Nose: Nose normal.  Eyes: Conjunctivae are normal. No scleral icterus.  Musculoskeletal:       No midline LS tenderness or bony abnormality. No obvious paraspinal muscle spasm. FROM at back and bilateral LE. Able to wt bear and ambulate without difficulty or assistance.   Neurological: He is alert.  Skin: Skin is warm and dry. No rash noted. He is not diaphoretic. No erythema. No pallor.  Psychiatric: He has a normal mood and affect.          Assessment & Plan:

## 2010-07-03 NOTE — Assessment & Plan Note (Signed)
Attempt motrin 600mg  po tid prn with food and no other nsaids. Followup if no improvement or worsening.

## 2010-07-03 NOTE — Assessment & Plan Note (Signed)
Reassured. Did discuss that pt could lose nail. Recommend avoidance of direct pressure on nail.

## 2010-07-14 ENCOUNTER — Telehealth: Payer: Self-pay | Admitting: Internal Medicine

## 2010-07-14 NOTE — Telephone Encounter (Signed)
Per MD, toe will remain discolored for a while. No appt or abx is necessary at this time. Pt aware

## 2010-07-14 NOTE — Telephone Encounter (Signed)
Triage vm-------saw Dr Rodena Medin about 2 week ago regarding his big toe. It is still blu, but not throbbing. Should he be seen again or get an abx? Please advise.

## 2010-08-05 ENCOUNTER — Ambulatory Visit (INDEPENDENT_AMBULATORY_CARE_PROVIDER_SITE_OTHER): Payer: Medicare Other | Admitting: Internal Medicine

## 2010-08-05 ENCOUNTER — Encounter: Payer: Self-pay | Admitting: Internal Medicine

## 2010-08-05 VITALS — BP 154/84 | Temp 98.2°F | Wt 183.0 lb

## 2010-08-05 DIAGNOSIS — IMO0002 Reserved for concepts with insufficient information to code with codable children: Secondary | ICD-10-CM

## 2010-08-05 MED ORDER — CEPHALEXIN 500 MG PO CAPS: 500.0000 mg | ORAL_CAPSULE | Freq: Three times a day (TID) | ORAL | Status: AC

## 2010-08-10 ENCOUNTER — Telehealth: Payer: Self-pay | Admitting: Internal Medicine

## 2010-08-10 NOTE — Telephone Encounter (Signed)
Pt called and said that he is having pain in lft leg, for 3 days. Pt wants call back from Dr Cato Mulligan or his nurse. Should pt take aspirin or advil.

## 2010-08-10 NOTE — Telephone Encounter (Signed)
The pts son was having this symptons and he is not an establish pt here.  The pt was wanting to speak to Dr Cato Mulligan personally.  The son has an appt with Dr Rodena Medin on Friday to establish care.  I told the pt the warning signs of a DVT and recommended he take his son the ER.  Pt agreed

## 2010-08-11 NOTE — Progress Notes (Signed)
  Subjective:    Patient ID: Marvin Jones, male    DOB: [birth year], 75 y.o.   MRN: [id number]  HPI  Patient comes in for evaluation. He has developed a painful, blue left toenail. Pain has been increasing over the past one to 2 weeks. The nail has actually been blue for quite some time. He denies any fever or chills.  Past Medical History  Diagnosis Date  . Irritable bowel syndrome   . Barrett's esophagus   . Stricture and stenosis of esophagus   . Diaphragmatic hernia without mention of obstruction or gangrene   . Diverticulosis of colon (without mention of hemorrhage)   . Personal history of colonic polyps   . Hypertrophy of prostate with urinary obstruction and other lower urinary tract symptoms (LUTS)   . Other and unspecified hyperlipidemia   . Esophageal reflux   . Depressive disorder, not elsewhere classified   . Anxiety state, unspecified    No past surgical history on file.  reports that he has been smoking Cigars.  He does not have any smokeless tobacco history on file. He reports that he drinks alcohol. He reports that he does not use illicit drugs. family history includes Colon cancer in an unspecified family member and Heart attack (age of onset:56) in his father. No Known Allergies   Review of Systems     patient denies chest pain, shortness of breath, orthopnea. Denies lower extremity edema, abdominal pain, change in appetite, change in bowel movements. Patient denies rashes, musculoskeletal complaints. No other specific complaints in a complete review of systems.   Objective:   Physical Exam Well-developed male in no acute distress. Peripheral pulses are normal in both feet. He does have a ecchymosis under the left toenail. He also has significant erythema and tenderness the medial aspect of the left great toe.       Assessment & Plan:  Subungual hematoma with paronychia. Discussed the subungual hematoma. I think he needs treatment for the paronychia. I think he  has some cellulitis. Will treat with antibiotics. He'll call me if his symptoms persist.

## 2010-09-06 ENCOUNTER — Telehealth: Payer: Self-pay | Admitting: *Deleted

## 2010-09-06 NOTE — Telephone Encounter (Signed)
Pt seen Dr Cato Mulligan for a blue toenail on left great toe.  Pt was given Keflex and he said he is no pain but it is sensitive.  He either wants to see Dr Cato Mulligan again or have him refer to a podiatrist. Dr Cato Mulligan is not in the office so per Dr Fabian Sharp if pt is not in any pain, then it can take months for the toe nail to fall off.  But if pt wants referral for piece of mind that she is ok with that.  Pt is aware and he will call back in a couple of days if he would like the referral.

## 2010-09-15 ENCOUNTER — Encounter: Payer: Self-pay | Admitting: Podiatry

## 2010-11-07 ENCOUNTER — Telehealth: Payer: Self-pay | Admitting: Internal Medicine

## 2010-11-07 NOTE — Telephone Encounter (Signed)
Called patient regarding his questions about his meds.  He states that Dr. Marina Goodell advised him to call "Bonita Quin" regarding increasing his Zoloft and Dicyclomine. I will transfer this to Bonita Quin so she can ask Dr. Marina Goodell tomorrow.  [facility] uses [facility]

## 2010-11-08 NOTE — Telephone Encounter (Signed)
Yes, I spoke with Marvin Jones recently. I want him to increase his Zoloft from 25 mg to 50 mg daily. He may need a new prescription to reflect this change. Continue Bentyl as needed.

## 2010-11-08 NOTE — Telephone Encounter (Signed)
Pt aware and new rx sent to pharmacy.

## 2010-11-08 NOTE — Telephone Encounter (Signed)
Spoke with pt and he was not taking the medication correctly. He was only taking 25mg  once daily when the previous rx stated to take 2 pills daily. Pt will start taking the medication as prescribed previously. Dr. Marina Goodell aware.

## 2010-11-08 NOTE — Telephone Encounter (Signed)
Pt last seen 04/20/10 for second opinion for abdominal pain Marvin Jones pt) .Pt states Dr. Marina Goodell had mentioned to him that he may need to have his medications increased. Pt states he is taking Zoloft 25mg  daily and he takes Bentyl 10mg  prn when he has abdominal discomfort/gas. Pt reports that about one day a week he has a day where his stomach is irritated and tender/aggrivated. Overall he states he is feeling pretty good, it is just that one day where he experiences tenderness. Pt states that if Dr. Marina Goodell wants to increase any medication [facility] [address] Dr. Marina Goodell please advise.

## 2010-12-14 ENCOUNTER — Ambulatory Visit (INDEPENDENT_AMBULATORY_CARE_PROVIDER_SITE_OTHER): Payer: Medicare Other

## 2010-12-14 DIAGNOSIS — Z23 Encounter for immunization: Secondary | ICD-10-CM

## 2011-01-05 ENCOUNTER — Telehealth: Payer: Self-pay | Admitting: Internal Medicine

## 2011-01-05 NOTE — Telephone Encounter (Signed)
Message left for pt to call back.  Pt called and states that the medicine that Dr. Marina Goodell gave him helps some but he is still having problems with his lower stomach. He states he is taking zoloft and pepto and that he is some better but wonders if he needs a stool test done. He has read some material and thinks he is somewhere between IBS and diverticulosis. He takes bentyl as needed and does not have to take it every day. Pt states that the abdominal discomfort is in his lower abdomen. Dr. Marina Goodell please advise.

## 2011-01-05 NOTE — Telephone Encounter (Signed)
Marvin Jones, I spoke with Mr. Clopper regarding his symptoms. Mr. Trickey will continue on his Zoloft. However, I would like you to prescribe generic Librax #100. One by mouth every 6 hours when necessary. 3 refills. He knows not to use his dicyclomine concurrently. He will keep [place] posted

## 2011-01-06 MED ORDER — CILIDINIUM-CHLORDIAZEPOXIDE 2.5-5 MG PO CAPS: ORAL_CAPSULE | ORAL | Status: DC

## 2011-01-06 NOTE — Telephone Encounter (Signed)
Rx sent to pharmacy and message left for pt. That script sent to pharmacy.

## 2011-01-30 ENCOUNTER — Encounter: Payer: Self-pay | Admitting: Internal Medicine

## 2011-01-30 ENCOUNTER — Ambulatory Visit (INDEPENDENT_AMBULATORY_CARE_PROVIDER_SITE_OTHER): Payer: Medicare Other | Admitting: Internal Medicine

## 2011-01-30 VITALS — BP 128/84 | HR 64 | Temp 98.1°F | Ht 69.0 in | Wt 180.0 lb

## 2011-01-30 DIAGNOSIS — Z0000 Encounter for general adult medical examination without abnormal findings: Secondary | ICD-10-CM

## 2011-01-30 DIAGNOSIS — Z79899 Other long term (current) drug therapy: Secondary | ICD-10-CM

## 2011-01-30 LAB — POCT URINALYSIS DIPSTICK
Bilirubin, UA: NEGATIVE
Blood, UA: NEGATIVE
Glucose, UA: NEGATIVE
Ketones, UA: NEGATIVE
Leukocytes, UA: NEGATIVE
Nitrite, UA: NEGATIVE
Protein, UA: NEGATIVE
Spec Grav, UA: 1.015
Urobilinogen, UA: 0.2
pH, UA: 7

## 2011-01-30 LAB — BASIC METABOLIC PANEL
BUN: 25 mg/dL — ABNORMAL HIGH (ref 6–23)
CO2: 24 mEq/L (ref 19–32)
Calcium: 9.2 mg/dL (ref 8.4–10.5)
Chloride: 104 mEq/L (ref 96–112)
Creatinine, Ser: 1.2 mg/dL (ref 0.4–1.5)
GFR: 62.45 mL/min (ref >60.00)
Glucose, Bld: 101 mg/dL — ABNORMAL HIGH (ref 70–99)
Potassium: 4.1 mEq/L (ref 3.5–5.1)
Sodium: 137 mEq/L (ref 135–145)

## 2011-01-30 LAB — HEPATIC FUNCTION PANEL
ALT: 32 U/L (ref 0–53)
AST: 37 U/L (ref 0–37)
Albumin: 4.3 g/dL (ref 3.5–5.2)
Alkaline Phosphatase: 62 U/L (ref 39–117)
Bilirubin, Direct: 0.1 mg/dL (ref 0.0–0.3)
Total Bilirubin: 0.9 mg/dL (ref 0.3–1.2)
Total Protein: 7.6 g/dL (ref 6.0–8.3)

## 2011-01-30 LAB — CBC WITH DIFFERENTIAL/PLATELET
Basophils Absolute: 0 10*3/uL (ref 0.0–0.1)
Basophils Relative: 0.5 % (ref 0.0–3.0)
Eosinophils Absolute: 0.1 10*3/uL (ref 0.0–0.7)
Eosinophils Relative: 1.2 % (ref 0.0–5.0)
HCT: 47.7 % (ref 39.0–52.0)
Hemoglobin: 16.2 g/dL (ref 13.0–17.0)
Lymphocytes Relative: 33.9 % (ref 12.0–46.0)
Lymphs Abs: 2.7 10*3/uL (ref 0.7–4.0)
MCHC: 33.9 g/dL (ref 30.0–36.0)
MCV: 92 fl (ref 78.0–100.0)
Monocytes Absolute: 0.6 10*3/uL (ref 0.1–1.0)
Monocytes Relative: 8.1 % (ref 3.0–12.0)
Neutro Abs: 4.5 10*3/uL (ref 1.4–7.7)
Neutrophils Relative %: 56.3 % (ref 43.0–77.0)
Platelets: 255 10*3/uL (ref 150.0–400.0)
RBC: 5.18 Mil/uL (ref 4.22–5.81)
RDW: 13.1 % (ref 11.5–14.6)
WBC: 7.9 10*3/uL (ref 4.5–10.5)

## 2011-01-30 LAB — LDL CHOLESTEROL, DIRECT: Direct LDL: 128.5 mg/dL

## 2011-01-30 LAB — LIPID PANEL
Cholesterol: 224 mg/dL — ABNORMAL HIGH (ref 0–200)
HDL: 55.2 mg/dL (ref >39.00)
Total CHOL/HDL Ratio: 4
Triglycerides: 105 mg/dL (ref 0.0–149.0)
VLDL: 21 mg/dL (ref 0.0–40.0)

## 2011-01-30 LAB — TSH: TSH: 2.41 u[IU]/mL (ref 0.35–5.50)

## 2011-01-30 NOTE — Progress Notes (Signed)
Patient ID: Marvin Jones, male   DOB: 05-03-1935, 75 y.o.   MRN: [id number] [facility]  Past Medical History  Diagnosis Date  . Irritable bowel syndrome   . Barrett's esophagus   . Stricture and stenosis of esophagus   . Diaphragmatic hernia without mention of obstruction or gangrene   . Diverticulosis of colon (without mention of hemorrhage)   . Personal history of colonic polyps   . Hypertrophy of prostate with urinary obstruction and other lower urinary tract symptoms (LUTS)   . Other and unspecified hyperlipidemia   . Esophageal reflux   . Depressive disorder, not elsewhere classified   . Anxiety state, unspecified     History   Social History  . Marital Status: Married    Spouse Name: N/A    Number of Children: 3  . Years of Education: N/A   Occupational History  . retired    Social History Main Topics  . Smoking status: Current Some Day Smoker    Types: Cigars  . Smokeless tobacco: Not on file  . Alcohol Use: Yes  . Drug Use: No  . Sexually Active: Not on file   Other Topics Concern  . Not on file   Social History Narrative   Daily caffein    No past surgical history on file.  Family History  Problem Relation Age of Onset  . Heart attack Father 29  . Colon cancer      neg. hx.    No Known Allergies  Current Outpatient Prescriptions on File Prior to Visit  Medication Sig Dispense Refill  . aspirin 81 MG tablet Take 81 mg by mouth daily.        [name] Kitchen atorvastatin (LIPITOR) 10 MG tablet Take 10 mg by mouth daily.        . clidinium-chlordiazePOXIDE (LIBRAX) 2.5-5 MG per capsule Take 1 by mouth every 6 hours as needed.  100 capsule  3  . dicyclomine (BENTYL) 10 MG capsule Take 10 mg by mouth 4 (four) times daily -  before meals and at bedtime. As needed       . FLUoxetine (PROZAC) 10 MG capsule Take 10 mg by mouth daily. 1/2-1 by mouth daily as needed       . sertraline (ZOLOFT) 25 MG tablet Take 25 mg by mouth 2 (two) times daily.        . Wheat Dextrin  (BENEFIBER) POWD Take by mouth. Take daily as directed          patient denies chest pain, shortness of breath, orthopnea. Denies lower extremity edema, abdominal pain, change in appetite, change in bowel movements. Patient denies rashes, musculoskeletal complaints. No other specific complaints in a complete review of systems.   BP 128/84  Pulse 64  Temp(Src) 98.1 F (36.7 C) (Oral)  Ht 5\' 9"  (1.753 m)  Wt 180 lb (81.647 kg)  BMI 26.58 kg/m2 Well-developed male in no acute distress. HEENT exam atraumatic, normocephalic, extraocular muscles are intact. Conjunctivae are pink without exudate. Neck is supple without lymphadenopathy, thyromegaly, jugular venous distention. Chest is clear to auscultation without increased work of breathing. Cardiac exam S1-S2 are regular. The PMI is normal. No significant murmurs or gallops. Abdominal exam active bowel sounds, soft, nontender. No abdominal bruits. Extremities no clubbing cyanosis or edema. Peripheral pulses are normal without bruits. Neurologic exam alert and oriented without any motor or sensory deficits.   A/P---well visit, health maint UTD.

## 2011-01-30 NOTE — Patient Instructions (Signed)
Call your insurance company and see if they will cover shingles vaccine. If they will, call us and we will give it to you  

## 2011-04-12 ENCOUNTER — Telehealth: Payer: Self-pay | Admitting: *Deleted

## 2011-04-12 MED ORDER — CLONAZEPAM 0.5 MG PO TABS: 0.5000 mg | ORAL_TABLET | Freq: Two times a day (BID) | ORAL | Status: DC | PRN

## 2011-04-12 NOTE — Telephone Encounter (Signed)
Pt took Clonazepam  2 years ago and would like Dr. Cato Mulligan to refill this for him ASAP.   Pharmacy will not fill it.  Son is having a depression issue and is making pt nervous.

## 2011-04-12 NOTE — Telephone Encounter (Signed)
Clonazepam 0.5 mg po qd prn anxiety #20/1 refill

## 2011-04-12 NOTE — Telephone Encounter (Signed)
Notified pt. 

## 2011-04-19 ENCOUNTER — Encounter: Payer: Self-pay | Admitting: Gastroenterology

## 2011-07-04 ENCOUNTER — Other Ambulatory Visit: Payer: Self-pay | Admitting: Internal Medicine

## 2011-07-06 ENCOUNTER — Other Ambulatory Visit: Payer: Self-pay | Admitting: Internal Medicine

## 2011-07-07 ENCOUNTER — Other Ambulatory Visit: Payer: Self-pay

## 2011-07-07 MED ORDER — SERTRALINE HCL 25 MG PO TABS: 25.0000 mg | ORAL_TABLET | Freq: Two times a day (BID) | ORAL | Status: DC

## 2011-07-12 NOTE — Telephone Encounter (Signed)
Completed 07/07/2011

## 2011-07-13 ENCOUNTER — Telehealth: Payer: Self-pay | Admitting: Internal Medicine

## 2011-07-13 NOTE — Telephone Encounter (Signed)
Called number back and waited to leave a message, fast busy signal heard. Unable to leave message.

## 2011-07-14 MED ORDER — ALIGN PO CAPS: 1.0000 | ORAL_CAPSULE | Freq: Every day | ORAL | Status: AC

## 2011-07-14 NOTE — Telephone Encounter (Signed)
Pt states he ran into Dr. Marina Goodell and discussed the fact that diverticulosis does not go away. He mentioned that the pt may try some align. Pt called and wants to know if we had any samples of align. Samples left up front for pt to pick up.

## 2011-08-02 ENCOUNTER — Encounter: Payer: Self-pay | Admitting: Internal Medicine

## 2011-08-02 ENCOUNTER — Telehealth: Payer: Self-pay | Admitting: Internal Medicine

## 2011-08-02 ENCOUNTER — Ambulatory Visit (INDEPENDENT_AMBULATORY_CARE_PROVIDER_SITE_OTHER): Payer: Medicare Other | Admitting: Internal Medicine

## 2011-08-02 VITALS — BP 118/80 | HR 72 | Temp 98.3°F | Resp 18 | Ht 69.0 in | Wt 179.0 lb

## 2011-08-02 DIAGNOSIS — N138 Other obstructive and reflux uropathy: Secondary | ICD-10-CM

## 2011-08-02 DIAGNOSIS — Z79899 Other long term (current) drug therapy: Secondary | ICD-10-CM

## 2011-08-02 DIAGNOSIS — E785 Hyperlipidemia, unspecified: Secondary | ICD-10-CM

## 2011-08-02 DIAGNOSIS — S91109A Unspecified open wound of unspecified toe(s) without damage to nail, initial encounter: Secondary | ICD-10-CM

## 2011-08-02 DIAGNOSIS — N401 Enlarged prostate with lower urinary tract symptoms: Secondary | ICD-10-CM

## 2011-08-02 DIAGNOSIS — Z0000 Encounter for general adult medical examination without abnormal findings: Secondary | ICD-10-CM

## 2011-08-02 DIAGNOSIS — Z1329 Encounter for screening for other suspected endocrine disorder: Secondary | ICD-10-CM

## 2011-08-02 LAB — LIPID PANEL
Cholesterol: 215 mg/dL — ABNORMAL HIGH (ref 0–200)
HDL: 45 mg/dL (ref >39)
LDL Cholesterol: 133 mg/dL — ABNORMAL HIGH (ref 0–99)
Total CHOL/HDL Ratio: 4.8 Ratio
Triglycerides: 183 mg/dL — ABNORMAL HIGH (ref <150)
VLDL: 37 mg/dL (ref 0–40)

## 2011-08-02 LAB — HEPATIC FUNCTION PANEL
ALT: 35 U/L (ref 0–53)
AST: 31 U/L (ref 0–37)
Albumin: 4.4 g/dL (ref 3.5–5.2)
Alkaline Phosphatase: 68 U/L (ref 39–117)
Bilirubin, Direct: 0.1 mg/dL (ref 0.0–0.3)
Indirect Bilirubin: 0.5 mg/dL (ref 0.0–0.9)
Total Bilirubin: 0.6 mg/dL (ref 0.3–1.2)
Total Protein: 7.3 g/dL (ref 6.0–8.3)

## 2011-08-02 LAB — BASIC METABOLIC PANEL
BUN: 21 mg/dL (ref 6–23)
CO2: 21 mEq/L (ref 19–32)
Calcium: 9.5 mg/dL (ref 8.4–10.5)
Chloride: 106 mEq/L (ref 96–112)
Creat: 1.12 mg/dL (ref 0.50–1.35)
Glucose, Bld: 86 mg/dL (ref 70–99)
Potassium: 4.6 mEq/L (ref 3.5–5.3)
Sodium: 137 mEq/L (ref 135–145)

## 2011-08-02 MED ORDER — AMOXICILLIN-POT CLAVULANATE 875-125 MG PO TABS: 1.0000 | ORAL_TABLET | Freq: Two times a day (BID) | ORAL | Status: AC

## 2011-08-02 NOTE — Telephone Encounter (Signed)
Please schedule fasting cpe labs v70.0 prior to next visit/cpe  Patient has cpe for 12/13. Patient will be going to [facility] lab

## 2011-08-02 NOTE — Patient Instructions (Signed)
Please schedule fasting cpe labs v70.0 prior to next visit/cpe

## 2011-08-02 NOTE — Telephone Encounter (Signed)
CPX labs entered for December 2013.

## 2011-08-06 ENCOUNTER — Encounter: Payer: Self-pay | Admitting: Internal Medicine

## 2011-08-06 DIAGNOSIS — S91109A Unspecified open wound of unspecified toe(s) without damage to nail, initial encounter: Secondary | ICD-10-CM | POA: Insufficient documentation

## 2011-08-06 NOTE — Assessment & Plan Note (Signed)
Possible mild cellulitis. Attempt po abx course. Followup if no improvement or worsening.

## 2011-08-06 NOTE — Assessment & Plan Note (Signed)
Obtain lipid/lft (not 100% fasting)

## 2011-08-06 NOTE — Progress Notes (Signed)
  Subjective:    Patient ID: Marvin Jo., male    DOB: 1935-04-20, 76 y.o.   MRN: [id number]  HPI Pt presents to clinic for followup of multiple medical problems. Suffered superficial laceration to right first toe 5d ago. Did not seek medical care. No f/c or purulent drainage. Tolerates statin tx without myalgias or abn lft.   Past Medical History  Diagnosis Date  . Irritable bowel syndrome   . Barrett's esophagus   . Stricture and stenosis of esophagus   . Diaphragmatic hernia without mention of obstruction or gangrene   . Diverticulosis of colon (without mention of hemorrhage)   . Personal history of colonic polyps   . Hypertrophy of prostate with urinary obstruction and other lower urinary tract symptoms (LUTS)   . Other and unspecified hyperlipidemia   . Esophageal reflux   . Depressive disorder, not elsewhere classified   . Anxiety state, unspecified    No past surgical history on file.  reports that he has been smoking Cigars.  He has quit using smokeless tobacco. He reports that he drinks alcohol. He reports that he does not use illicit drugs. family history includes Colon cancer in an unspecified family member and Heart attack (age of onset:56) in his father. No Known Allergies    Review of Systems see hpi     Objective:   Physical Exam  Nursing note and vitals reviewed. Constitutional: He appears well-developed and well-nourished. No distress.  HENT:  Head: Normocephalic and atraumatic.  Eyes: Conjunctivae are normal. No scleral icterus.  Neck: Neck supple.  Cardiovascular: Normal rate, regular rhythm and normal heart sounds.  Exam reveals no gallop and no friction rub.   No murmur heard. Pulmonary/Chest: Effort normal and breath sounds normal. No respiratory distress. He has no wheezes. He has no rales.  Skin: Skin is warm and dry. He is not diaphoretic.       Right first toe- mild avulsion of skin. + redness. No purulent drainage          Assessment &  Plan:

## 2011-08-08 ENCOUNTER — Other Ambulatory Visit: Payer: Self-pay | Admitting: Internal Medicine

## 2011-08-08 DIAGNOSIS — E785 Hyperlipidemia, unspecified: Secondary | ICD-10-CM

## 2011-09-03 ENCOUNTER — Other Ambulatory Visit: Payer: Self-pay | Admitting: Internal Medicine

## 2011-11-24 ENCOUNTER — Encounter: Payer: Self-pay | Admitting: Gastroenterology

## 2011-11-24 ENCOUNTER — Other Ambulatory Visit: Payer: Self-pay | Admitting: Dermatology

## 2011-12-02 ENCOUNTER — Other Ambulatory Visit: Payer: Self-pay | Admitting: Internal Medicine

## 2012-02-05 ENCOUNTER — Encounter: Payer: Medicare Other | Admitting: Internal Medicine

## 2012-02-05 DIAGNOSIS — Z0289 Encounter for other administrative examinations: Secondary | ICD-10-CM

## 2012-06-20 ENCOUNTER — Other Ambulatory Visit: Payer: Self-pay | Admitting: Internal Medicine

## 2012-06-20 ENCOUNTER — Telehealth: Payer: Self-pay | Admitting: Internal Medicine

## 2012-06-20 ENCOUNTER — Other Ambulatory Visit: Payer: Self-pay | Admitting: Dermatology

## 2012-06-20 DIAGNOSIS — N400 Enlarged prostate without lower urinary tract symptoms: Secondary | ICD-10-CM

## 2012-06-20 DIAGNOSIS — K589 Irritable bowel syndrome without diarrhea: Secondary | ICD-10-CM

## 2012-06-20 DIAGNOSIS — K5730 Diverticulosis of large intestine without perforation or abscess without bleeding: Secondary | ICD-10-CM

## 2012-06-20 DIAGNOSIS — K219 Gastro-esophageal reflux disease without esophagitis: Secondary | ICD-10-CM

## 2012-06-20 NOTE — Telephone Encounter (Signed)
Pt aware and orders placed in epic.

## 2012-06-20 NOTE — Telephone Encounter (Signed)
Yes, okay to order labs for Marvin Jones. Please order [facility] panel (CBC, comprehensive metabolic panel, TSH) as well as lipid panel and PSA. Thanks

## 2012-06-20 NOTE — Telephone Encounter (Signed)
Pt switched to Dr. Rodena Medin from Dr. Cato Mulligan last year, Dr. Cato Mulligan was very involved in epic and hard to see. Pt saw Dr. Rodena Medin and is now 2 mths past due for his annual exam. Called to see Dr. Rodena Medin and he is on medical leave. Pt made an appt to see Dr. Marina Goodell the 1st part of June to "discuss his diverticulosis." Pt wants to know if Dr. Marina Goodell will order some labs on him since he hasn't had any labs in over a year. Pt is interested in his PSA, Cholesterol, Hgb, and Chemistries. Pt would like to have labs prior to his beach trip 07/15/12. Dr. Marina Goodell please advise.

## 2012-06-27 ENCOUNTER — Other Ambulatory Visit (INDEPENDENT_AMBULATORY_CARE_PROVIDER_SITE_OTHER): Payer: Medicare Other

## 2012-06-27 DIAGNOSIS — N400 Enlarged prostate without lower urinary tract symptoms: Secondary | ICD-10-CM

## 2012-06-27 DIAGNOSIS — K589 Irritable bowel syndrome without diarrhea: Secondary | ICD-10-CM

## 2012-06-27 DIAGNOSIS — N401 Enlarged prostate with lower urinary tract symptoms: Secondary | ICD-10-CM

## 2012-06-27 DIAGNOSIS — N138 Other obstructive and reflux uropathy: Secondary | ICD-10-CM

## 2012-06-27 DIAGNOSIS — E785 Hyperlipidemia, unspecified: Secondary | ICD-10-CM

## 2012-06-27 DIAGNOSIS — K219 Gastro-esophageal reflux disease without esophagitis: Secondary | ICD-10-CM

## 2012-06-27 DIAGNOSIS — K5730 Diverticulosis of large intestine without perforation or abscess without bleeding: Secondary | ICD-10-CM

## 2012-06-27 LAB — CBC WITH DIFFERENTIAL/PLATELET
Basophils Absolute: 0 10*3/uL (ref 0.0–0.1)
Basophils Relative: 0.6 % (ref 0.0–3.0)
Eosinophils Absolute: 0.2 10*3/uL (ref 0.0–0.7)
Eosinophils Relative: 1.9 % (ref 0.0–5.0)
HCT: 46.4 % (ref 39.0–52.0)
Hemoglobin: 15.9 g/dL (ref 13.0–17.0)
Lymphocytes Relative: 28.5 % (ref 12.0–46.0)
Lymphs Abs: 2.4 10*3/uL (ref 0.7–4.0)
MCHC: 34.3 g/dL (ref 30.0–36.0)
MCV: 90.3 fl (ref 78.0–100.0)
Monocytes Absolute: 0.7 10*3/uL (ref 0.1–1.0)
Monocytes Relative: 8.4 % (ref 3.0–12.0)
Neutro Abs: 5.2 10*3/uL (ref 1.4–7.7)
Neutrophils Relative %: 60.6 % (ref 43.0–77.0)
Platelets: 238 10*3/uL (ref 150.0–400.0)
RBC: 5.14 Mil/uL (ref 4.22–5.81)
RDW: 13 % (ref 11.5–14.6)
WBC: 8.5 10*3/uL (ref 4.5–10.5)

## 2012-06-27 LAB — COMPREHENSIVE METABOLIC PANEL
ALT: 32 U/L (ref 0–53)
AST: 39 U/L — ABNORMAL HIGH (ref 0–37)
Albumin: 4 g/dL (ref 3.5–5.2)
Alkaline Phosphatase: 58 U/L (ref 39–117)
BUN: 20 mg/dL (ref 6–23)
CO2: 26 mEq/L (ref 19–32)
Calcium: 9 mg/dL (ref 8.4–10.5)
Chloride: 105 mEq/L (ref 96–112)
Creatinine, Ser: 1.1 mg/dL (ref 0.4–1.5)
GFR: 72.47 mL/min (ref >60.00)
Glucose, Bld: 101 mg/dL — ABNORMAL HIGH (ref 70–99)
Potassium: 4.4 mEq/L (ref 3.5–5.1)
Sodium: 137 mEq/L (ref 135–145)
Total Bilirubin: 1 mg/dL (ref 0.3–1.2)
Total Protein: 6.9 g/dL (ref 6.0–8.3)

## 2012-06-27 LAB — LIPID PANEL
Cholesterol: 196 mg/dL (ref 0–200)
HDL: 47.1 mg/dL (ref >39.00)
LDL Cholesterol: 126 mg/dL — ABNORMAL HIGH (ref 0–99)
Total CHOL/HDL Ratio: 4
Triglycerides: 114 mg/dL (ref 0.0–149.0)
VLDL: 22.8 mg/dL (ref 0.0–40.0)

## 2012-06-27 LAB — TSH: TSH: 3.21 u[IU]/mL (ref 0.35–5.50)

## 2012-06-27 LAB — PSA: PSA: 1.37 ng/mL (ref 0.10–4.00)

## 2012-07-02 ENCOUNTER — Other Ambulatory Visit: Payer: Self-pay | Admitting: Internal Medicine

## 2012-07-02 NOTE — Telephone Encounter (Signed)
Refilled Zoloft per Dr. Perry 

## 2012-07-02 NOTE — Telephone Encounter (Signed)
Message copied by Jeanine Luz on Tue Jul 02, 2012  2:28 PM ------      Message from: Hilarie Fredrickson      Created: Tue Jul 02, 2012  1:34 PM       Please refill                  ----- Message -----         From: Jeanine Luz, CMA         Sent: 07/02/2012   1:19 PM           To: Hilarie Fredrickson, MD            Patient requesting refill of Zoloft.  Last refill 11/2012 #60 tab bid with 2 refills.  Last o/v 04-20-10 but has appointment scheduled for 07/23/2012.  Please advise.       ------

## 2012-07-23 ENCOUNTER — Ambulatory Visit (INDEPENDENT_AMBULATORY_CARE_PROVIDER_SITE_OTHER): Payer: Medicare Other | Admitting: Internal Medicine

## 2012-07-23 ENCOUNTER — Encounter: Payer: Self-pay | Admitting: Internal Medicine

## 2012-07-23 VITALS — BP 102/68 | HR 64 | Ht 69.5 in | Wt 182.0 lb

## 2012-07-23 DIAGNOSIS — K589 Irritable bowel syndrome without diarrhea: Secondary | ICD-10-CM

## 2012-07-23 DIAGNOSIS — K2270 Barrett's esophagus without dysplasia: Secondary | ICD-10-CM

## 2012-07-23 DIAGNOSIS — K219 Gastro-esophageal reflux disease without esophagitis: Secondary | ICD-10-CM

## 2012-07-23 DIAGNOSIS — R1084 Generalized abdominal pain: Secondary | ICD-10-CM

## 2012-07-23 DIAGNOSIS — Z86010 Personal history of colonic polyps: Secondary | ICD-10-CM

## 2012-07-23 MED ORDER — OMEPRAZOLE 20 MG PO CPDR: 20.0000 mg | DELAYED_RELEASE_CAPSULE | Freq: Every day | ORAL | Status: DC

## 2012-07-23 MED ORDER — MOVIPREP 100 G PO SOLR: 1.0000 | Freq: Once | ORAL | Status: DC

## 2012-07-23 NOTE — Patient Instructions (Addendum)
You have been scheduled for an endoscopy and colonoscopy with propofol. Please follow the written instructions given to you at your visit today. Please pick up your prep at the pharmacy within the next 1-3 days. If you use inhalers (even only as needed), please bring them with you on the day of your procedure.   We have sent the following medications to your pharmacy for you to pick up at your convenience:  Omeprazole

## 2012-07-23 NOTE — Progress Notes (Signed)
HISTORY OF PRESENT ILLNESS:  Marvin Jones. is a 77 y.o. male with a history of GERD complicated by peptic stricture and Barrett's esophagus, adenomatous colon polyps, and chronic irritable bowel syndrome. He also has a history of anxiety/depression, benign prostatic hypertrophy, and diverticulosis. I saw him initially as a second opinion regarding chronic abdominal discomfort in February 2012. See that dictation. The clinical impression at that time was irritable bowel syndrome for which we discussed treatment options. As well GERD for which he was to continue on Nexium and adenomatous colon polyps for which she was due for followup November 2013. Patient has since contacted the office on several occasions regarding his irritable bowel complaints. He was treated with Zoloft 25 mg daily with improvement. Subsequently increased to 50 mg daily for recurrent symptoms. He also uses Bentyl as needed, which he states helps. Today he describes the same complaints. Lower abdominal discomfort. He reports 2-3 bowel movements each morning. This prohibits him from leaving the house 2 early. No nocturnal symptoms. He is anxious. Also contacted the office recently requested blood work, as his physician, Dr. Cato Mulligan has reduced schedule secondary to growing administrator responsibilities. In any event, he is looking to establish with a primary care provider. His last colonoscopy in January of 2008 revealed multiple adenomas. His last upper endoscopy November 2011 revealed Barrett's esophagus, peptic stricture, and esophagitis. Patient is currently on no PPI. Does have occasional reflux symptoms. He denies dysphagia.  REVIEW OF SYSTEMS:  All non-GI ROS negative upon review  Past Medical History  Diagnosis Date  . Irritable bowel syndrome   . Barrett's esophagus   . Stricture and stenosis of esophagus   . Diaphragmatic hernia without mention of obstruction or gangrene   . Diverticulosis of colon (without mention of  hemorrhage)   . Personal history of colonic polyps   . Hypertrophy of prostate with urinary obstruction and other lower urinary tract symptoms (LUTS)   . Other and unspecified hyperlipidemia   . Esophageal reflux   . Depressive disorder, not elsewhere classified   . Anxiety state, unspecified     History reviewed. No pertinent past surgical history.  Social History Viola Placeres.  reports that he has been smoking Cigars.  He has quit using smokeless tobacco. He reports that  drinks alcohol. He reports that he does not use illicit drugs.  family history includes Colon cancer in an unspecified family member and Heart attack (age of onset: 52) in his father.  No Known Allergies     PHYSICAL EXAMINATION: Vital signs: BP 102/68  Pulse 64  Ht 5' 9.5" (1.765 m)  Wt 182 lb (82.555 kg)  BMI 26.5 kg/m2  Constitutional: generally well-appearing, no acute distress Psychiatric: alert and oriented x3, cooperative Eyes: extraocular movements intact, anicteric, conjunctiva pink Mouth: oral pharynx moist, no lesions Neck: supple no lymphadenopathy Cardiovascular: heart regular rate and rhythm, no murmur Lungs: clear to auscultation bilaterally Abdomen: soft, nontender, nondistended, no obvious ascites, no peritoneal signs, normal bowel sounds, no organomegaly Rectal: Deferred until colonoscopy Extremities: no lower extremity edema bilaterally Skin: no lesions on visible extremities Neuro: No focal deficits.   ASSESSMENT:  #1. Chronic lower abdominal complaints consistent with irritable bowel #2. History of adenomatous colon polyps due for followup #3. History of GERD complicated by peptic stricture and Barrett's esophagus. Currently on no acid suppressive therapy. #4. Health related anxiety   PLAN:  #1. Discussion of irritable bowel syndrome. Reassurance #2. Literature on IBS provided #3. Schedule surveillance colonoscopy.The  nature of the procedure, as well as the risks,  benefits, and alternatives were carefully and thoroughly reviewed with the patient. Ample time for discussion and questions allowed. The patient understood, was satisfied, and agreed to proceed. Movi prep prescribed. The patient instructed on his use #4. Schedule surveillance upper endoscopy with biopsies.The nature of the procedure, as well as the risks, benefits, and alternatives were carefully and thoroughly reviewed with the patient. Ample time for discussion and questions allowed. The patient understood, was satisfied, and agreed to proceed. #5. Prescribe omeprazole 20 mg daily. #6. Reflux precautions #7. Literature on Barrett's and esophagitis/stricture provided for his review #8. Instructed to secure PCP ASAP. He wants to see Dr. Rodena Medin whom he has seen previously

## 2012-07-24 ENCOUNTER — Encounter: Payer: Self-pay | Admitting: Internal Medicine

## 2012-07-24 ENCOUNTER — Ambulatory Visit (AMBULATORY_SURGERY_CENTER): Payer: Medicare Other | Admitting: Internal Medicine

## 2012-07-24 VITALS — BP 128/69 | HR 54 | Temp 97.9°F | Resp 16 | Ht 69.0 in | Wt 182.0 lb

## 2012-07-24 DIAGNOSIS — R1084 Generalized abdominal pain: Secondary | ICD-10-CM

## 2012-07-24 DIAGNOSIS — K2270 Barrett's esophagus without dysplasia: Secondary | ICD-10-CM

## 2012-07-24 DIAGNOSIS — K219 Gastro-esophageal reflux disease without esophagitis: Secondary | ICD-10-CM

## 2012-07-24 DIAGNOSIS — Z86010 Personal history of colonic polyps: Secondary | ICD-10-CM

## 2012-07-24 DIAGNOSIS — R1310 Dysphagia, unspecified: Secondary | ICD-10-CM

## 2012-07-24 DIAGNOSIS — D126 Benign neoplasm of colon, unspecified: Secondary | ICD-10-CM

## 2012-07-24 DIAGNOSIS — Z1211 Encounter for screening for malignant neoplasm of colon: Secondary | ICD-10-CM

## 2012-07-24 DIAGNOSIS — K2100 Gastro-esophageal reflux disease with esophagitis, without bleeding: Secondary | ICD-10-CM

## 2012-07-24 DIAGNOSIS — K589 Irritable bowel syndrome without diarrhea: Secondary | ICD-10-CM

## 2012-07-24 MED ORDER — SODIUM CHLORIDE 0.9 % IV SOLN: 500.0000 mL | INTRAVENOUS | Status: DC

## 2012-07-24 NOTE — Op Note (Signed)
[facility] [address] [place], [address]   COLONOSCOPY PROCEDURE REPORT  PATIENT: Marvin Jones, Marvin Jones  MR#: [id number] BIRTHDATE: 1935/03/11 , 76  yrs. old GENDER: Male ENDOSCOPIST: Roxy Cedar, MD REFERRED BY:.  Self / Office PROCEDURE DATE:  07/24/2012 PROCEDURE:   Colonoscopy with snare polypectomy    x 1 ASA CLASS:   Class II INDICATIONS:Patient's personal history of adenomatous colon polyps. Multiple adenomas 02-2006 MEDICATIONS: MAC sedation, administered by CRNA and propofol (Diprivan) 300mg  IV  DESCRIPTION OF PROCEDURE:   After the risks benefits and alternatives of the procedure were thoroughly explained, informed consent was obtained.  A digital rectal exam revealed no abnormalities of the rectum.   The LB [id number]  endoscope was introduced through the anus and advanced to the cecum, which was identified by both the appendix and ileocecal valve. No adverse events experienced.   The quality of the prep was good, using MoviPrep  The instrument was then slowly withdrawn as the colon was fully examined.      COLON FINDINGS: A diminutive polyp was found in the transverse colon.  A polypectomy was performed with a cold snare.  The resection was complete and the polyp tissue was completely retrieved.   The colon was otherwise normal.  There was no diverticulosis, inflammation, other polyps or cancers . Retroflexed views revealed internal hemorrhoids. The time to cecum=3 minutes 12 seconds.  Withdrawal time=14 minutes 41 seconds. The scope was withdrawn and the procedure completed. COMPLICATIONS: There were no complications.  ENDOSCOPIC IMPRESSION: 1.   Diminutive polyp was found in the transverse colon; polypectomy was performed with a cold snare 2.   The colon was otherwise normal  RECOMMENDATIONS: 1.  Follow up colonoscopy in 5 years 2.  Upper endoscopy today. SEE REPORT   eSigned:  Roxy Cedar, MD 07/24/2012 2:14  PM   cc: Pecola Lawless, MD and The Patient   PATIENT NAME:  Marvin Jones, Marvin Jones MR#: [id number]

## 2012-07-24 NOTE — Progress Notes (Signed)
Called to room to assist during endoscopic procedure.  Patient ID and intended procedure confirmed with present staff. Received instructions for my participation in the procedure from the performing physician.  

## 2012-07-24 NOTE — Patient Instructions (Signed)

## 2012-07-24 NOTE — Progress Notes (Signed)
Patient did not experience any of the following events: a burn prior to discharge; a fall within the facility; wrong site/side/patient/procedure/implant event; or a hospital transfer or hospital admission upon discharge from the facility. (G8907) Patient did not have preoperative order for IV antibiotic SSI prophylaxis. (G8918)  

## 2012-07-24 NOTE — Op Note (Signed)
[facility] [address] [place], [address]   ENDOSCOPY PROCEDURE REPORT  PATIENT: Marvin, Jones  MR#: [id number] BIRTHDATE: [birth year] , 76  yrs. old GENDER: Male ENDOSCOPIST: Roxy Cedar, MD REFERRED BY:  .  Self / Office PROCEDURE DATE:  07/24/2012 PROCEDURE:  EGD, diagnostic ASA CLASS:     Class II INDICATIONS:  History of esophageal reflux.   Dysphagia (reported today). Barrett's on bx 2011.[name] Kitchen MEDICATIONS: MAC sedation, administered by CRNA and propofol (Diprivan) 200mg  IV TOPICAL ANESTHETIC: none  DESCRIPTION OF PROCEDURE: After the risks benefits and alternatives of the procedure were thoroughly explained, informed consent was obtained.  The LB [id number] endoscope was introduced through the mouth and advanced to the second portion of the duodenum. Without limitations.  The instrument was slowly withdrawn as the mucosa was fully examined.      EXAM: Erosive esophagitis distally (see photo).  No obvious Barrett's.The stomach and duodenum were normal.  Retroflexed views revealed no abnormalities.     The scope was then withdrawn from the patient and the procedure completed.  COMPLICATIONS: There were no complications. ENDOSCOPIC IMPRESSION: 1. GERD with Erosive esophagitis distally.  RECOMMENDATIONS: 1.  Anti-reflux regimen to be followed 2.  Continue OMEPRAZOLE (PRILOSEC) 20 MG DAILY (for Acid Reflux) 3.  INCREASE ZOLOFT (SERTRALINE) FROM 50 MG DAILY TO 75 MG DAILY 4.  Call for office visit to be seen by Dr. Marina Goodell in 4-6 weeks  REPEAT EXAM:  eSigned:  Roxy Cedar, MD 07/24/2012 2:27 PM   ON:[id number] Minerva Ends, MD and The Patient

## 2012-07-25 ENCOUNTER — Telehealth: Payer: Self-pay | Admitting: *Deleted

## 2012-07-25 NOTE — Telephone Encounter (Signed)
  Follow up Call-  Call back number 07/24/2012  Post procedure Call Back phone  # [phone]  Permission to leave phone message Yes     Patient questions:  Do you have a fever, pain , or abdominal swelling? no Pain Score  0 *  Have you tolerated food without any problems? yes  Have you been able to return to your normal activities? yes  Do you have any questions about your discharge instructions: Diet   no Medications  no Follow up visit  no  Do you have questions or concerns about your Care? no  Actions: * If pain score is 4 or above: No action needed, pain <4.

## 2012-07-30 ENCOUNTER — Encounter: Payer: Self-pay | Admitting: Internal Medicine

## 2012-08-30 ENCOUNTER — Ambulatory Visit: Payer: Medicare Other | Admitting: Internal Medicine

## 2012-10-09 ENCOUNTER — Other Ambulatory Visit: Payer: Self-pay | Admitting: Internal Medicine

## 2012-10-10 ENCOUNTER — Telehealth: Payer: Self-pay

## 2012-10-10 MED ORDER — SERTRALINE HCL 25 MG PO TABS: 25.0000 mg | ORAL_TABLET | Freq: Every day | ORAL | Status: DC

## 2012-10-10 NOTE — Telephone Encounter (Signed)
Message copied by Jeanine Luz on Thu Oct 10, 2012 11:17 AM ------      Message from: Hilarie Fredrickson      Created: Wed Oct 09, 2012  2:13 PM       Please refill. Thanks       ----- Message -----         From: Jeanine Luz, CMA         Sent: 10/09/2012   1:35 PM           To: Hilarie Fredrickson, MD            Patient requesting refill of Zoloft, last filled 11/2011 for one year.  Last office visit 07/2012.  Ok to refill?       ------

## 2012-10-10 NOTE — Telephone Encounter (Signed)
Refilled Zoloft per Dr. Perry 

## 2012-10-11 ENCOUNTER — Telehealth: Payer: Self-pay

## 2012-10-11 NOTE — Telephone Encounter (Signed)
Message copied by Jeanine Luz on Fri Oct 11, 2012  4:12 PM ------      Message from: Hilarie Fredrickson      Created: Wed Oct 09, 2012  2:13 PM       Please refill. Thanks       ----- Message -----         From: Jeanine Luz, CMA         Sent: 10/09/2012   1:35 PM           To: Hilarie Fredrickson, MD            Patient requesting refill of Zoloft, last filled 11/2011 for one year.  Last office visit 07/2012.  Ok to refill?       ------

## 2012-11-11 ENCOUNTER — Encounter: Payer: Self-pay | Admitting: Internal Medicine

## 2012-12-09 ENCOUNTER — Telehealth: Payer: Self-pay | Admitting: Internal Medicine

## 2012-12-09 NOTE — Telephone Encounter (Signed)
Left message for pt to call back.  Left message for pt to call back.

## 2012-12-11 NOTE — Telephone Encounter (Signed)
Left message for pt to call back  °

## 2012-12-17 NOTE — Telephone Encounter (Signed)
Left message for pt to call back.  After multiple unsuccessful attempt to reach pt. Encounter closed.

## 2013-01-01 ENCOUNTER — Encounter: Payer: Self-pay | Admitting: Internal Medicine

## 2013-01-01 ENCOUNTER — Ambulatory Visit (INDEPENDENT_AMBULATORY_CARE_PROVIDER_SITE_OTHER): Payer: Medicare Other | Admitting: Internal Medicine

## 2013-01-01 DIAGNOSIS — R109 Unspecified abdominal pain: Secondary | ICD-10-CM

## 2013-01-01 DIAGNOSIS — K219 Gastro-esophageal reflux disease without esophagitis: Secondary | ICD-10-CM

## 2013-01-01 DIAGNOSIS — K222 Esophageal obstruction: Secondary | ICD-10-CM

## 2013-01-01 DIAGNOSIS — K589 Irritable bowel syndrome without diarrhea: Secondary | ICD-10-CM

## 2013-01-01 MED ORDER — SERTRALINE HCL 25 MG PO TABS: 25.0000 mg | ORAL_TABLET | Freq: Three times a day (TID) | ORAL | Status: DC

## 2013-01-01 NOTE — Progress Notes (Signed)
HISTORY OF PRESENT ILLNESS:  Marvin Jones. is a 77 y.o. male with a history of GERD complicated by peptic stricture, Barrett's esophagus, adenomatous colon polyps, and chronic irritable bowel syndrome with a tendency toward diarrhea. He was last seen in the office 07/23/2012 with irritable bowel complaints. At that time, he was on Zoloft 25 mg daily with some improvement. For his GERD he was continued on omeprazole 20 mg daily. He subsequently underwent colonoscopy and upper endoscopy on 07/24/2012. Colonoscopy revealed a diminutive transverse colon polyp which was removed and found to be a tubular adenoma. Otherwise normal exam. Upper endoscopy revealed GERD with erosive esophagitis. At that time his omeprazole (recently started) was continued and his Zoloft increased to 50 mg daily. He presents today with concerns over lower abdominal discomfort. He describes this as burning. Generally last 3 days. During that time 5 or 6 bowel movements daily. Not necessarily diarrhea. He feels that the discomfort is somewhat worse, possibly different. No weight loss. No bleeding. Reflux symptoms are controlled with PPI. No recurrent dysphagia. This problems with lower abdominal discomfort have begun to limit his quality of life. He avoids social events and activities with flares.  REVIEW OF SYSTEMS:  All non-GI ROS negative except for  Past Medical History  Diagnosis Date  . Irritable bowel syndrome   . Barrett's esophagus   . Stricture and stenosis of esophagus   . Diaphragmatic hernia without mention of obstruction or gangrene   . Diverticulosis of colon (without mention of hemorrhage)   . Personal history of colonic polyps   . Hypertrophy of prostate with urinary obstruction and other lower urinary tract symptoms (LUTS)   . Other and unspecified hyperlipidemia   . Esophageal reflux   . Depressive disorder, not elsewhere classified   . Anxiety state, unspecified     Past Surgical History  Procedure  Laterality Date  . Tonsillectomy      Social History Earsel Shouse.  reports that he has been smoking Cigars.  He has quit using smokeless tobacco. He reports that he drinks alcohol. He reports that he does not use illicit drugs.  family history includes Diabetes in his son; Heart attack (age of onset: 64) in his father. There is no history of Colon cancer.  No Known Allergies     PHYSICAL EXAMINATION: Vital signs: BP 140/60  Pulse 64  Ht 5\' 10"  (1.778 m)  Wt 184 lb (83.462 kg)  BMI 26.40 kg/m2 General: Well-developed, well-nourished, no acute distress HEENT: Sclerae are anicteric, conjunctiva pink. Oral mucosa intact Lungs: Clear Heart: Regular Abdomen: soft, nontender, nondistended, no obvious ascites, no peritoneal signs, normal bowel sounds. No organomegaly. Extremities: No edema Psychiatric: alert and oriented x3. Cooperative     ASSESSMENT:  #1. Lower abdominal discomfort and change in bowel habits. This likely IBS. However, the patient feels it is worse and now started to limit his quality of life. Rule out other entities #2. GERD, peptic stricture. Currently asymptomatic on PPI   PLAN:  #1. Tissue transglutaminase antibody and serum IgA to screen for celiac disease #2. Contrast-enhanced CT scan of the abdomen and pelvis to further evaluate worsening lower abdominal discomfort #3. Continue PPI #4. Increased Zoloft 75 mg daily. Prescribed adjusted dose #5. Routine GI followup in 3 months.

## 2013-01-01 NOTE — Patient Instructions (Signed)
Your physician has requested that you go to the basement for the following lab work before leaving today:  TTG, IGA, BMET  We have sent the following medications to your pharmacy for you to pick up at your convenience:  Zoloft  You have been scheduled for a CT scan of the abdomen and pelvis at [facility] ([address]---this is in the same building as [facility]).   You are scheduled on 01/06/2013 at 9:30am. You should arrive 15 minutes prior to your appointment time for registration. Please follow the written instructions below on the day of your exam:  WARNING: IF YOU ARE ALLERGIC TO IODINE/X-RAY DYE, PLEASE NOTIFY RADIOLOGY IMMEDIATELY AT [phone]! YOU WILL BE GIVEN A 13 HOUR PREMEDICATION PREP.  1) Do not eat or drink anything after 5:30am (4 hours prior to your test) 2) You have been given 2 bottles of oral contrast to drink. The solution may taste better if refrigerated, but do NOT add ice or any other liquid to this solution. Shake well before drinking.    Drink 1 bottle of contrast @ 7:30am (2 hours prior to your exam)  Drink 1 bottle of contrast @ 8:30am (1 hour prior to your exam)  You may take any medications as prescribed with a small amount of water except for the following: Metformin, Glucophage, Glucovance, Avandamet, Riomet, Fortamet, Actoplus Met, Janumet, Glumetza or Metaglip. The above medications must be held the day of the exam AND 48 hours after the exam.  The purpose of you drinking the oral contrast is to aid in the visualization of your intestinal tract. The contrast solution may cause some diarrhea. Before your exam is started, you will be given a small amount of fluid to drink. Depending on your individual set of symptoms, you may also receive an intravenous injection of x-ray contrast/dye. Plan on being at [facility] for 30 minutes or long, depending on the type of exam you are having performed.  If you have any questions regarding  your exam or if you need to reschedule, you may call the CT department at [phone] between the hours of 8:00 am and 5:00 pm, Monday-Friday.  ________________________________________________________________________

## 2013-01-02 ENCOUNTER — Other Ambulatory Visit (INDEPENDENT_AMBULATORY_CARE_PROVIDER_SITE_OTHER): Payer: Medicare Other

## 2013-01-02 DIAGNOSIS — R109 Unspecified abdominal pain: Secondary | ICD-10-CM

## 2013-01-02 LAB — BASIC METABOLIC PANEL
BUN: 20 mg/dL (ref 6–23)
CO2: 24 mEq/L (ref 19–32)
Calcium: 9.1 mg/dL (ref 8.4–10.5)
Chloride: 103 mEq/L (ref 96–112)
Creatinine, Ser: 1.1 mg/dL (ref 0.4–1.5)
GFR: 70.08 mL/min (ref >60.00)
Glucose, Bld: 95 mg/dL (ref 70–99)
Potassium: 4.2 mEq/L (ref 3.5–5.1)
Sodium: 134 mEq/L — ABNORMAL LOW (ref 135–145)

## 2013-01-02 LAB — IGA: IgA: 367 mg/dL (ref 68–378)

## 2013-01-06 ENCOUNTER — Ambulatory Visit (INDEPENDENT_AMBULATORY_CARE_PROVIDER_SITE_OTHER)
Admission: RE | Admit: 2013-01-06 | Discharge: 2013-01-06 | Disposition: A | Discharge status: Home / Self Care | Payer: Medicare Other | Source: Ambulatory Visit | Attending: Internal Medicine | Admitting: Internal Medicine

## 2013-01-06 DIAGNOSIS — R109 Unspecified abdominal pain: Secondary | ICD-10-CM

## 2013-01-06 MED ORDER — IOHEXOL 300 MG/ML  SOLN
100.0000 mL | Freq: Once | INTRAMUSCULAR | Status: AC | PRN
  Administered 2013-01-06: 100 mL via INTRAVENOUS

## 2013-01-15 LAB — THIOPURINE METHYLTRANSFERASE (TPMT), RBC: Thiopurine Methyltransferase, RBC: 18 (ref >12)

## 2013-01-22 ENCOUNTER — Telehealth: Payer: Self-pay

## 2013-01-22 DIAGNOSIS — R109 Unspecified abdominal pain: Secondary | ICD-10-CM

## 2013-01-22 NOTE — Telephone Encounter (Signed)
Called [facility] and informed them I had ordered an incorrect lab (TPMT) for patient.  They credited the charge for the lab back to the patient.  I put in a new order for the correct lab (TTG) and asked pt to come by to have it drawn.  Patient agreed to come by at the first of the wee.

## 2013-02-17 ENCOUNTER — Other Ambulatory Visit: Payer: Self-pay | Admitting: Internal Medicine

## 2013-02-24 ENCOUNTER — Other Ambulatory Visit: Payer: Self-pay | Admitting: Internal Medicine

## 2013-02-24 ENCOUNTER — Telehealth: Payer: Self-pay

## 2013-02-24 NOTE — Telephone Encounter (Signed)
Refilled Bentyl per Dr. Henrene Pastor

## 2013-02-24 NOTE — Telephone Encounter (Signed)
Message copied by Audrea Muscat on Mon Feb 24, 2013  4:52 PM ------      Message from: Irene Shipper      Created: Mon Feb 24, 2013  4:40 PM       Ok to refill.      ----- Message -----         From: Audrea Muscat, CMA         Sent: 02/24/2013   4:05 PM           To: Irene Shipper, MD            Patient requesting a refill of Bentyl, last filled 09/05/2011.  Last office visit 01/01/2013.  Ok to refill?       ------

## 2013-02-25 ENCOUNTER — Telehealth: Payer: Self-pay

## 2013-02-25 ENCOUNTER — Other Ambulatory Visit: Payer: Self-pay | Admitting: Dermatology

## 2013-02-25 ENCOUNTER — Other Ambulatory Visit: Payer: Self-pay | Admitting: Internal Medicine

## 2013-02-25 NOTE — Telephone Encounter (Signed)
Message copied by Audrea Muscat on Tue Feb 25, 2013  2:21 PM ------      Message from: Irene Shipper      Created: Mon Feb 24, 2013  4:40 PM       Ok to refill.      ----- Message -----         From: Audrea Muscat, CMA         Sent: 02/24/2013   4:05 PM           To: Irene Shipper, MD            Patient requesting a refill of Bentyl, last filled 09/05/2011.  Last office visit 01/01/2013.  Ok to refill?       ------

## 2013-02-25 NOTE — Telephone Encounter (Signed)
Called patient to let him know had refilled his Bentyl and Zoloft.  Also reminded patient to come in and have a TTG drawn, per our conversation in November.  Patient agreed

## 2013-02-26 ENCOUNTER — Other Ambulatory Visit: Payer: Medicare Other

## 2013-02-26 DIAGNOSIS — R109 Unspecified abdominal pain: Secondary | ICD-10-CM

## 2013-02-27 LAB — TISSUE TRANSGLUTAMINASE, IGA: Tissue Transglutaminase Ab, IgA: 5.1 U/mL (ref <20)

## 2013-03-11 ENCOUNTER — Telehealth: Payer: Self-pay

## 2013-03-11 NOTE — Telephone Encounter (Signed)
Message copied by Audrea Muscat on Tue Mar 11, 2013 11:17 AM ------      Message from: Irene Shipper      Created: Fri Jan 17, 2013  1:59 PM       Magda Paganini, this was a mistake. I asked for Tissue transglutaminase antibody (which was not done) not Thiopurine methltransferase. Serum IGA was correctly ordered.  Please correct this error and make sure that the patient is not charged for the incorrect lab. Thanks ------

## 2013-03-11 NOTE — Telephone Encounter (Signed)
Message copied by Audrea Muscat on Tue Mar 11, 2013 11:16 AM ------      Message from: Irene Shipper      Created: Fri Jan 17, 2013  1:59 PM       Magda Paganini, this was a mistake. I asked for Tissue transglutaminase antibody (which was not done) not Thiopurine methltransferase. Serum IGA was correctly ordered.  Please correct this error and make sure that the patient is not charged for the incorrect lab. Thanks ------

## 2013-04-05 ENCOUNTER — Other Ambulatory Visit: Payer: Self-pay | Admitting: Internal Medicine

## 2013-04-24 ENCOUNTER — Other Ambulatory Visit: Payer: Self-pay | Admitting: Dermatology

## 2013-05-09 ENCOUNTER — Telehealth: Payer: Self-pay | Admitting: Internal Medicine

## 2013-05-09 NOTE — Telephone Encounter (Signed)
Pt states he is having abdominal cramping, some loose stools, and no fever. Pt states he thinks he has a virus. Instructed pt that there is a virus going around and that we recommend a mild diet and Imodium. Pt will try this and call us back on Monday if not better.

## 2013-06-01 ENCOUNTER — Encounter (HOSPITAL_BASED_OUTPATIENT_CLINIC_OR_DEPARTMENT_OTHER): Payer: Self-pay | Admitting: Emergency Medicine

## 2013-06-01 ENCOUNTER — Emergency Department (HOSPITAL_BASED_OUTPATIENT_CLINIC_OR_DEPARTMENT_OTHER)
Admission: EM | Admit: 2013-06-01 | Discharge: 2013-06-01 | Disposition: A | Discharge status: Home / Self Care | Payer: Medicare Other | Attending: Emergency Medicine | Admitting: Emergency Medicine

## 2013-06-01 DIAGNOSIS — Y9389 Activity, other specified: Secondary | ICD-10-CM | POA: Insufficient documentation

## 2013-06-01 DIAGNOSIS — Y929 Unspecified place or not applicable: Secondary | ICD-10-CM | POA: Insufficient documentation

## 2013-06-01 DIAGNOSIS — F329 Major depressive disorder, single episode, unspecified: Secondary | ICD-10-CM | POA: Insufficient documentation

## 2013-06-01 DIAGNOSIS — K589 Irritable bowel syndrome without diarrhea: Secondary | ICD-10-CM | POA: Insufficient documentation

## 2013-06-01 DIAGNOSIS — Z7982 Long term (current) use of aspirin: Secondary | ICD-10-CM | POA: Insufficient documentation

## 2013-06-01 DIAGNOSIS — E785 Hyperlipidemia, unspecified: Secondary | ICD-10-CM | POA: Insufficient documentation

## 2013-06-01 DIAGNOSIS — F17200 Nicotine dependence, unspecified, uncomplicated: Secondary | ICD-10-CM | POA: Insufficient documentation

## 2013-06-01 DIAGNOSIS — S61409A Unspecified open wound of unspecified hand, initial encounter: Secondary | ICD-10-CM | POA: Insufficient documentation

## 2013-06-01 DIAGNOSIS — K219 Gastro-esophageal reflux disease without esophagitis: Secondary | ICD-10-CM | POA: Insufficient documentation

## 2013-06-01 DIAGNOSIS — Z860100 Personal history of colon polyps, unspecified: Secondary | ICD-10-CM | POA: Insufficient documentation

## 2013-06-01 DIAGNOSIS — F411 Generalized anxiety disorder: Secondary | ICD-10-CM | POA: Insufficient documentation

## 2013-06-01 DIAGNOSIS — Z79899 Other long term (current) drug therapy: Secondary | ICD-10-CM | POA: Insufficient documentation

## 2013-06-01 DIAGNOSIS — W268XXA Contact with other sharp object(s), not elsewhere classified, initial encounter: Secondary | ICD-10-CM | POA: Insufficient documentation

## 2013-06-01 DIAGNOSIS — F3289 Other specified depressive episodes: Secondary | ICD-10-CM | POA: Insufficient documentation

## 2013-06-01 DIAGNOSIS — S6990XA Unspecified injury of unspecified wrist, hand and finger(s), initial encounter: Secondary | ICD-10-CM

## 2013-06-01 NOTE — ED Notes (Signed)
Pt hand bandaged with bacitracin, 2x2, and roller gauze

## 2013-06-01 NOTE — ED Notes (Signed)
Patient here with a small fish hook stuck in palm of left hand. No bleeding, patient cut the barb off the hook so only small portion raised from skin

## 2013-06-01 NOTE — ED Provider Notes (Signed)
CSN: [id number]     Arrival date & time 06/01/13  1036 History   First MD Initiated Contact with Patient 06/01/13 1040     Chief Complaint  Patient presents with  . fish hook to hand      (Consider location/radiation/quality/duration/timing/severity/associated sxs/prior Treatment) Patient is a 78 y.o. male presenting with hand injury. The history is provided by the patient.  Hand Injury Location:  Hand Time since incident:  2 days Injury: yes   Mechanism of injury comment:  Fishhook embedded in hand Hand location:  L hand Pain details:    Quality:  Sharp   Radiates to:  Does not radiate   Severity:  Mild   Onset quality:  Sudden   Duration:  2 days   Timing:  Constant Chronicity:  New Handedness:  Right-handed Dislocation: no   Foreign body present:  Metal Relieved by:  Nothing Worsened by:  Nothing tried Ineffective treatments:  None tried Associated symptoms: no back pain, no decreased range of motion and no fever     Past Medical History  Diagnosis Date  . Irritable bowel syndrome   . Barrett's esophagus   . Stricture and stenosis of esophagus   . Diaphragmatic hernia without mention of obstruction or gangrene   . Diverticulosis of colon (without mention of hemorrhage)   . Personal history of colonic polyps   . Hypertrophy of prostate with urinary obstruction and other lower urinary tract symptoms (LUTS)   . Other and unspecified hyperlipidemia   . Esophageal reflux   . Depressive disorder, not elsewhere classified   . Anxiety state, unspecified    Past Surgical History  Procedure Laterality Date  . Tonsillectomy     Family History  Problem Relation Age of Onset  . Heart attack Father 54  . Colon cancer Neg Hx   . Diabetes Son    History  Substance Use Topics  . Smoking status: Current Some Day Smoker    Types: Cigars  . Smokeless tobacco: Former Systems developer  . Alcohol Use: Yes     Comment: 3 drinks per week    Review of Systems  Constitutional:  Negative for fever and chills.  Respiratory: Negative for cough and shortness of breath.   Gastrointestinal: Negative for vomiting and abdominal pain.  Musculoskeletal: Negative for back pain.  All other systems reviewed and are negative.     Allergies  Review of patient's allergies indicates no known allergies.  Home Medications   Current Outpatient Rx  Name  Route  Sig  Dispense  Refill  . aspirin 81 MG tablet   Oral   Take 81 mg by mouth daily.           . clonazePAM (KLONOPIN) 0.5 MG tablet   Oral   Take 1 tablet (0.5 mg total) by mouth 2 (two) times daily as needed for anxiety.   20 tablet   1   . dicyclomine (BENTYL) 10 MG capsule      TAKE AS NEEDED AS DIRECTED   60 capsule   1   . omeprazole (PRILOSEC) 20 MG capsule   Oral   Take 1 capsule (20 mg total) by mouth daily.   30 capsule   11   . sertraline (ZOLOFT) 25 MG tablet      TAKE 1 TABLET (25 MG TOTAL) BY MOUTH 2 (TWO) TIMES DAILY.   90 tablet   2   . sertraline (ZOLOFT) 25 MG tablet   Oral   Take 1 tablet (25  mg total) by mouth 3 (three) times daily.   90 tablet   2    BP 170/76  Pulse 66  Temp(Src) 98.2 F (36.8 C) (Oral)  Resp 18  Ht 5\' 9"  (1.753 m)  Wt 179 lb (81.194 kg)  BMI 26.42 kg/m2  SpO2 96% Physical Exam  Nursing note and vitals reviewed. Constitutional: He is oriented to person, place, and time. He appears well-developed and well-nourished. No distress.  HENT:  Head: Normocephalic and atraumatic.  Mouth/Throat: No oropharyngeal exudate.  Eyes: EOM are normal. Pupils are equal, round, and reactive to light.  Neck: Normal range of motion. Neck supple.  Cardiovascular: Normal rate and regular rhythm.  Exam reveals no friction rub.   No murmur heard. Pulmonary/Chest: Effort normal and breath sounds normal. No respiratory distress. He has no wheezes. He has no rales.  Abdominal: He exhibits no distension. There is no tenderness. There is no rebound.  Musculoskeletal: Normal  range of motion. He exhibits no edema.       Hands: Neurological: He is alert and oriented to person, place, and time.  Skin: No rash noted. He is not diaphoretic.    ED Course  FOREIGN BODY REMOVAL Date/Time: 06/01/2013 11:14 AM Performed by: Osvaldo Shipper Authorized by: Osvaldo Shipper Consent: Verbal consent obtained. Body area: skin General location: upper extremity Location details: left hand Anesthesia: local infiltration Local anesthetic: lidocaine 2% without epinephrine Anesthetic total: 5 ml Patient sedated: no Patient restrained: no Patient cooperative: yes Localization method: visualized Removal mechanism: forceps Dressing: dressing applied Tendon involvement: none Depth: subcutaneous Complexity: simple 1 objects recovered. Objects recovered: Fishhook Post-procedure assessment: foreign body removed Patient tolerance: Patient tolerated the procedure well with no immediate complications.   (including critical care time) Labs Review Labs Reviewed - No data to display Imaging Review No results found.   EKG Interpretation None      MDM   Final diagnoses:  Fish hook injury of hand    51M here with fishhook in L palm midshaft of 5th metacarpal. No surrounding erythema or cellulitis. Removal as above. Stable for discharge.    Osvaldo Shipper, MD 06/01/13 1118

## 2013-07-14 ENCOUNTER — Other Ambulatory Visit: Payer: Self-pay | Admitting: Internal Medicine

## 2013-08-11 ENCOUNTER — Other Ambulatory Visit: Payer: Self-pay | Admitting: Dermatology

## 2013-09-18 ENCOUNTER — Telehealth: Payer: Self-pay

## 2013-09-18 MED ORDER — SERTRALINE HCL 25 MG PO TABS: 25.0000 mg | ORAL_TABLET | Freq: Three times a day (TID) | ORAL | Status: DC

## 2013-09-18 NOTE — Telephone Encounter (Signed)
Refilled Zoloft per Dr. Henrene Pastor

## 2013-09-18 NOTE — Telephone Encounter (Signed)
Message copied by Audrea Muscat on Thu Sep 18, 2013  4:35 PM ------      Message from: Irene Shipper      Created: Thu Sep 18, 2013  4:23 PM       Ok to refill.      ----- Message -----         From: Audrea Muscat, CMA         Sent: 09/18/2013   2:23 PM           To: Irene Shipper, MD            Request for refill of Zoloft 25mg  tid;  Last filled 07/15/13 with two refills.  Ok to refill?       ------

## 2013-09-18 NOTE — Telephone Encounter (Signed)
Sent message to Dr. Henrene Pastor requesting authorization to refill Zoloft, 90 day supply.  Awaiting response.

## 2013-09-18 NOTE — Telephone Encounter (Signed)
Refilled per Dr. Perry 

## 2013-12-29 ENCOUNTER — Telehealth: Payer: Self-pay | Admitting: Internal Medicine

## 2013-12-29 NOTE — Telephone Encounter (Signed)
Pt states he is having problems with epigastric pain and loose stools. States he has a dull constant pain in his stomach all the time. Pt scheduled to see Nicoletta Ba PA tomorrow at 9:30am. Pt aware of appt.

## 2013-12-30 ENCOUNTER — Other Ambulatory Visit (INDEPENDENT_AMBULATORY_CARE_PROVIDER_SITE_OTHER): Payer: Medicare Other

## 2013-12-30 ENCOUNTER — Encounter: Payer: Self-pay | Admitting: Physician Assistant

## 2013-12-30 ENCOUNTER — Ambulatory Visit (INDEPENDENT_AMBULATORY_CARE_PROVIDER_SITE_OTHER): Payer: Medicare Other | Admitting: Physician Assistant

## 2013-12-30 VITALS — BP 144/70 | HR 68 | Ht 69.0 in | Wt 188.2 lb

## 2013-12-30 DIAGNOSIS — R197 Diarrhea, unspecified: Secondary | ICD-10-CM

## 2013-12-30 DIAGNOSIS — K589 Irritable bowel syndrome without diarrhea: Secondary | ICD-10-CM

## 2013-12-30 LAB — CBC WITH DIFFERENTIAL/PLATELET
Basophils Absolute: 0.1 10*3/uL (ref 0.0–0.1)
Basophils Relative: 1.3 % (ref 0.0–3.0)
Eosinophils Absolute: 0.1 10*3/uL (ref 0.0–0.7)
Eosinophils Relative: 1.2 % (ref 0.0–5.0)
HCT: 47.6 % (ref 39.0–52.0)
Hemoglobin: 16 g/dL (ref 13.0–17.0)
Lymphocytes Relative: 21 % (ref 12.0–46.0)
Lymphs Abs: 1.8 10*3/uL (ref 0.7–4.0)
MCHC: 33.6 g/dL (ref 30.0–36.0)
MCV: 89.9 fl (ref 78.0–100.0)
Monocytes Absolute: 0.6 10*3/uL (ref 0.1–1.0)
Monocytes Relative: 6.6 % (ref 3.0–12.0)
Neutro Abs: 6 10*3/uL (ref 1.4–7.7)
Neutrophils Relative %: 69.9 % (ref 43.0–77.0)
Platelets: 270 10*3/uL (ref 150.0–400.0)
RBC: 5.3 Mil/uL (ref 4.22–5.81)
RDW: 12.8 % (ref 11.5–15.5)
WBC: 8.6 10*3/uL (ref 4.0–10.5)

## 2013-12-30 LAB — COMPREHENSIVE METABOLIC PANEL
ALT: 29 U/L (ref 0–53)
AST: 36 U/L (ref 0–37)
Albumin: 3.5 g/dL (ref 3.5–5.2)
Alkaline Phosphatase: 62 U/L (ref 39–117)
BUN: 19 mg/dL (ref 6–23)
CO2: 23 mEq/L (ref 19–32)
Calcium: 9.2 mg/dL (ref 8.4–10.5)
Chloride: 105 mEq/L (ref 96–112)
Creatinine, Ser: 1.1 mg/dL (ref 0.4–1.5)
GFR: 68.44 mL/min (ref >60.00)
Glucose, Bld: 96 mg/dL (ref 70–99)
Potassium: 4.2 mEq/L (ref 3.5–5.1)
Sodium: 139 mEq/L (ref 135–145)
Total Bilirubin: 0.6 mg/dL (ref 0.2–1.2)
Total Protein: 7.3 g/dL (ref 6.0–8.3)

## 2013-12-30 LAB — SEDIMENTATION RATE: Sed Rate: 9 mm/hr (ref 0–22)

## 2013-12-30 LAB — IRON: Iron: 117 ug/dL (ref 42–165)

## 2013-12-30 LAB — C-REACTIVE PROTEIN: CRP: <0.5 mg/dL (ref 0.5–20.0)

## 2013-12-30 NOTE — Patient Instructions (Signed)
We have given you 9 days of Xifaxan medication.  Take 1 tab twice daily. Take 1 tab of Bentyl ( Dicyclomine)  Every morning. You may take a 2nd dose of the Bentyl later in the day if needed. Take a probiotic daily. Clinical cytogeneticist.[name] Kitchen You can get these at your pharmacy.  We made you a follow up appointment with Dr. Scarlette Shorts on 02-27-2014 at 10:00 am .

## 2013-12-30 NOTE — Progress Notes (Signed)
Subjective:    Patient ID: Marvin Jones., male    DOB: [birth year], 78 y.o.   MRN: [id number]  HPI Marvin Jones is a very nice 78 year old white male known to Dr. Henrene Pastor with history of GERD and peptic stricture. He has history of Barrett's but no evidence for Barrett's on last EGD in 2014. He also has history of adenomatous colon polyps. He has had some chronic problems with frequent loose bowel movements over the past 3-4 years and is felt to have IBS. Last colonoscopy in June 2014 negative with exception of 1 diminutive polyp which was a tubular adenoma. He had celiac testing done in 2014 which was negative and at that time was asked to use dicyclomine as needed and increased his Zoloft. Comes in today stating that he's had ongoing problems over the past year and perhaps a little bit worse over time. He says some days he has 6-7 loose bowel movements per day and other days 4-5. He gets a dull lower abdominal achy discomfort which she says is not actually pain. He has not seen any melena or hematochezia. He is unaware of any specific food triggers. He says the bowel frequency is altering his life  at this point, he canceled a fishing trip to the [place] a month or so ago because he was afraid he would have problems with his bowels. Is appetite is fine and his weight is stable, he has not had any recent antibiotics. He says when he has these "flares" with days of increased diarrhea he usually does not feel quite as well and is more fatigued. He says probably about half of his days are like that. He also has a lot of gas chronically.   Review of Systems  Constitutional: Positive for fatigue.  HENT: Negative.   Eyes: Negative.   Respiratory: Negative.   Cardiovascular: Negative.   Gastrointestinal: Positive for diarrhea.  Endocrine: Negative.   Genitourinary: Negative.   Musculoskeletal: Negative.   Skin: Negative.   Allergic/Immunologic: Negative.   Neurological: Negative.   Hematological:  Negative.   Psychiatric/Behavioral: Negative.    Outpatient Prescriptions Prior to Visit  Medication Sig Dispense Refill  . aspirin 81 MG tablet Take 81 mg by mouth daily.      [name] Kitchen dicyclomine (BENTYL) 10 MG capsule TAKE AS NEEDED AS DIRECTED 60 capsule 1  . omeprazole (PRILOSEC) 20 MG capsule Take 1 capsule (20 mg total) by mouth daily. 30 capsule 11  . sertraline (ZOLOFT) 25 MG tablet TAKE 1 TABLET (25 MG TOTAL) BY MOUTH 2 (TWO) TIMES DAILY. 90 tablet 2  . clonazePAM (KLONOPIN) 0.5 MG tablet Take 1 tablet (0.5 mg total) by mouth 2 (two) times daily as needed for anxiety. 20 tablet 1  . sertraline (ZOLOFT) 25 MG tablet Take 1 tablet (25 mg total) by mouth 3 (three) times daily. 270 tablet 2   No facility-administered medications prior to visit.   No Known Allergies Patient Active Problem List   Diagnosis Date Noted  . Toe avulsion 08/06/2011  . BARRETTS ESOPHAGUS 03/04/2010  . IRRITABLE BOWEL SYNDROME 03/04/2010  . ESOPHAGEAL STRICTURE 12/16/2009  . HIATAL HERNIA WITH REFLUX 12/16/2009  . DIVERTICULOSIS, COLON 12/16/2009  . COLONIC POLYPS, ADENOMATOUS, HX OF 12/16/2009  . HYPERLIPIDEMIA 11/23/2005  . ANXIETY 11/23/2005  . DEPRESSION 11/23/2005  . GERD 11/23/2005  . BENIGN PROSTATIC HYPERTROPHY, WITH URINARY OBSTRUCTION 11/23/2005   History  Substance Use Topics  . Smoking status: Former Smoker    Types: Cigars  .  Smokeless tobacco: Former Systems developer  . Alcohol Use: 0.0 oz/week    0 Not specified per week     Comment: 3 drinks per week   family history includes Diabetes in his son; Heart attack (age of onset: 80) in his father. There is no history of Colon cancer.     Objective:   Physical Exam  Well-developed older white male in no acute distress, quite pleasant blood pressure 144/70 pulse 68 height 5 foot 9 weight 188. HEENT nontraumatic normocephalic EOMI PERRLA sclera anicteric, Supple ;no JVD, Cardiovascular; reg;ular rate and rhythm with S1-S2 no murmur or gallop, Pulmonary  ;clear bilaterally, Abdomen; soft and minimally tender in the left lower quadrant and suprapubic area there is no guarding or rebound no palpable mass or hepatosplenomegaly bowel sounds are present, Rectal ;exam not done, Extremities; no clubbing cyanosis or edema skin warm and dry, Psych ;mood and affect appropriate        Assessment & Plan:   #50  78 year old male with 3-4 year history of persistent loose stools somewhat progressive now with 5-6 bowel movements per day. This may be IBS/D, would also consider microscopic colitis #2 anxiety and depression #3 chronic GERD with history of peptic stricture currently asymptomatic #4 hyperlipidemia #5 history of adenomatous colon polyps, last colonoscopy June 2014  Plan;Continue  Prilosec 20 mg by mouth every morning We'll give him a course of Xifaxan 550 twice daily 9 days for potential bacterial overgrowth/IBS D Add a daily probiotic suggested floor store or align Patient encouraged to take Bentyl 10 mg every morning and a second dose later in the day as needed We'll check CBC with differential sedimentation rate and CRP He also requests a CMET We'll plan office follow-up in 4-5 weeks with Dr. Henrene Pastor or myself-if he is not seeing any improvement consider colonoscopy with random biopsies

## 2013-12-30 NOTE — Progress Notes (Signed)
Agree with initial assessment and plans 

## 2014-01-28 ENCOUNTER — Telehealth: Payer: Self-pay | Admitting: Internal Medicine

## 2014-01-28 NOTE — Telephone Encounter (Signed)
Left message for pt to call back  °

## 2014-01-29 NOTE — Telephone Encounter (Signed)
Spoke with pt and he wanted to know if benefiber is a good fiber supplement. Discussed with pt that this is fine and citracel is also a good one to try. Pt verbalized understanding.

## 2014-02-03 ENCOUNTER — Other Ambulatory Visit: Payer: Self-pay | Admitting: Internal Medicine

## 2014-02-27 ENCOUNTER — Ambulatory Visit: Payer: Medicare Other | Admitting: Internal Medicine

## 2014-03-01 ENCOUNTER — Other Ambulatory Visit: Payer: Self-pay | Admitting: Internal Medicine

## 2014-03-03 ENCOUNTER — Other Ambulatory Visit: Payer: Self-pay | Admitting: Dermatology

## 2014-03-03 DIAGNOSIS — L821 Other seborrheic keratosis: Secondary | ICD-10-CM | POA: Diagnosis not present

## 2014-03-03 DIAGNOSIS — D1801 Hemangioma of skin and subcutaneous tissue: Secondary | ICD-10-CM | POA: Diagnosis not present

## 2014-03-03 DIAGNOSIS — L570 Actinic keratosis: Secondary | ICD-10-CM | POA: Diagnosis not present

## 2014-03-03 DIAGNOSIS — Z85828 Personal history of other malignant neoplasm of skin: Secondary | ICD-10-CM | POA: Diagnosis not present

## 2014-03-03 DIAGNOSIS — C44519 Basal cell carcinoma of skin of other part of trunk: Secondary | ICD-10-CM | POA: Diagnosis not present

## 2014-03-03 DIAGNOSIS — D485 Neoplasm of uncertain behavior of skin: Secondary | ICD-10-CM | POA: Diagnosis not present

## 2014-03-09 ENCOUNTER — Ambulatory Visit (INDEPENDENT_AMBULATORY_CARE_PROVIDER_SITE_OTHER): Payer: Medicare Other | Admitting: Internal Medicine

## 2014-03-09 ENCOUNTER — Encounter: Payer: Self-pay | Admitting: Internal Medicine

## 2014-03-09 VITALS — BP 142/78 | HR 63 | Temp 97.5°F | Ht 69.0 in | Wt 188.2 lb

## 2014-03-09 DIAGNOSIS — N400 Enlarged prostate without lower urinary tract symptoms: Secondary | ICD-10-CM

## 2014-03-09 DIAGNOSIS — F419 Anxiety disorder, unspecified: Secondary | ICD-10-CM | POA: Insufficient documentation

## 2014-03-09 DIAGNOSIS — F32A Depression, unspecified: Secondary | ICD-10-CM | POA: Insufficient documentation

## 2014-03-09 DIAGNOSIS — F418 Other specified anxiety disorders: Secondary | ICD-10-CM

## 2014-03-09 DIAGNOSIS — E785 Hyperlipidemia, unspecified: Secondary | ICD-10-CM

## 2014-03-09 DIAGNOSIS — Z23 Encounter for immunization: Secondary | ICD-10-CM | POA: Diagnosis not present

## 2014-03-09 DIAGNOSIS — F329 Major depressive disorder, single episode, unspecified: Secondary | ICD-10-CM

## 2014-03-09 DIAGNOSIS — Z0000 Encounter for general adult medical examination without abnormal findings: Secondary | ICD-10-CM | POA: Insufficient documentation

## 2014-03-09 LAB — PSA: PSA: 1.8 ng/mL (ref 0.10–4.00)

## 2014-03-09 LAB — LIPID PANEL
Cholesterol: 196 mg/dL (ref 0–200)
HDL: 45 mg/dL (ref >39.00)
LDL Cholesterol: 119 mg/dL — ABNORMAL HIGH (ref 0–99)
NonHDL: 151
Total CHOL/HDL Ratio: 4
Triglycerides: 160 mg/dL — ABNORMAL HIGH (ref 0.0–149.0)
VLDL: 32 mg/dL (ref 0.0–40.0)

## 2014-03-09 MED ORDER — ZOSTER VACCINE LIVE 19400 UNT/0.65ML ~~LOC~~ SOLR: 0.6500 mL | Freq: Once | SUBCUTANEOUS | Status: DC

## 2014-03-09 NOTE — Progress Notes (Signed)
Subjective:    Patient ID: Marvin Jones., male    DOB: [birth year], 79 y.o.   MRN: [id number]  DOS:  03/09/2014 Type of visit - description :  New patient to me, transferring from Dr. Simone Curia Interval history: History of anxiety, on SSRIs, doing great, he is now retired and has increased his exercise History of IBS, symptoms currently well controlled High cholesterol, on no medications, has improved his lifestyle BPH, last PSA about year and a half ago, no recent DRE   ROS Denies chest pain or difficulty breathing No cough or sputum production No dysuria, gross hematuria, difficulty urinating. Flow is  slightly slow  Past Medical History  Diagnosis Date  . Irritable bowel syndrome   . Barrett's esophagus   . Stricture and stenosis of esophagus   . Diaphragmatic hernia without mention of obstruction or gangrene   . Diverticulosis of colon (without mention of hemorrhage)   . Personal history of colonic polyps   . Hypertrophy of prostate with urinary obstruction and other lower urinary tract symptoms (LUTS)   . Esophageal reflux   . Anxiety and depression     h/o  . Hyperlipidemia   . Skin cancer     sees Dr Ronnald Ramp     Past Surgical History  Procedure Laterality Date  . Tonsillectomy      History   Social History  . Marital Status: Married    Spouse Name: N/A    Number of Children: 3  . Years of Education: N/A   Occupational History  . retired-- Research scientist (life sciences)     Social History Main Topics  . Smoking status: Former Smoker    Types: Cigars  . Smokeless tobacco: Former Systems developer     Comment: cigars, quit ~ 2005  . Alcohol Use: 0.0 oz/week    0 Not specified per week     Comment: 3 drinks per week  . Drug Use: No  . Sexual Activity: Not on file   Other Topics Concern  . Not on file   Social History Narrative   Household-- pt and wife, marriage healthy   3 independent adult children        Medication List       This list is accurate as of: 03/09/14  11:59 PM.  Always use your most recent med list.               aspirin 81 MG tablet  Take 81 mg by mouth daily.     bismuth subsalicylate 240 XB/35HG suspension  Commonly known as:  PEPTO BISMOL  Take 30 mLs by mouth every 6 (six) hours as needed.     dicyclomine 10 MG capsule  Commonly known as:  BENTYL  TAKE AS NEEDED AS DIRECTED     omeprazole 20 MG capsule  Commonly known as:  PRILOSEC  Take 1 capsule (20 mg total) by mouth daily.     sertraline 25 MG tablet  Commonly known as:  ZOLOFT  TAKE 1 TABLET (25 MG TOTAL) BY MOUTH 2 (TWO) TIMES DAILY.     zoster vaccine live (PF) 19400 UNT/0.65ML injection  Commonly known as:  ZOSTAVAX  Inject 19,400 Units into the skin once.           Objective:   Physical Exam BP 142/78 mmHg  Pulse 63  Temp(Src) 97.5 F (36.4 C) (Oral)  Ht 5\' 9"  (1.753 m)  Wt 188 lb 4 oz (85.39 kg)  BMI 27.79 kg/m2  SpO2 94%  General -- alert, well-developed, NAD.  Neck --no thyromegaly  HEENT-- Not pale.  Lungs -- normal respiratory effort, no intercostal retractions, no accessory muscle use, and normal breath sounds.  Heart-- normal rate, regular rhythm, no murmur.   Rectal-- No external abnormalities noted. Normal sphincter tone. No rectal masses or tenderness. Stool brown  Prostate--Prostate gland firm and smooth, ++ enlargement, no nodularity, tenderness, mass, asymmetry or induration. Extremities-- no pretibial edema bilaterally  Neurologic--  alert & oriented X3. Speech normal, gait appropriate for age, strength symmetric and appropriate for age.   Psych-- Cognition and judgment appear intact. Cooperative with normal attention span and concentration. No anxious or depressed appearing.        Assessment & Plan:

## 2014-03-09 NOTE — Assessment & Plan Note (Addendum)
Last PSA  was 2014, it was satisfactory, DRE show a large prostate without nodularity, will check a PSA

## 2014-03-09 NOTE — Assessment & Plan Note (Signed)
On sertraline, low dose. The patient retired, he is more physically active and feels great

## 2014-03-09 NOTE — Progress Notes (Signed)
Pre visit review using our clinic review tool, if applicable. No additional management support is needed unless otherwise documented below in the visit note. 

## 2014-03-09 NOTE — Assessment & Plan Note (Signed)
On no medication, check FLP

## 2014-03-09 NOTE — Assessment & Plan Note (Addendum)
Not doing a cpx today but I got the following information: Td 2011 Had a pneumonia shot Prevnar  today We discussed Zostavax, prescription provided.

## 2014-03-09 NOTE — Patient Instructions (Signed)
Get your blood work before you leave   Please come back to the office in 6-8 months  for a physical exam. Come back fasting

## 2014-03-18 ENCOUNTER — Telehealth: Payer: Self-pay | Admitting: Internal Medicine

## 2014-03-18 NOTE — Telephone Encounter (Signed)
Called patient and notified him of lab results.  Letter previously mailed by Vilma Prader.  eal

## 2014-03-18 NOTE — Telephone Encounter (Signed)
Caller name: Joshue, Badal. Relation to pt: self  Call back number: [phone]  Reason for call:  Pt requesting lab results

## 2014-05-08 DIAGNOSIS — L570 Actinic keratosis: Secondary | ICD-10-CM | POA: Diagnosis not present

## 2014-05-08 DIAGNOSIS — Z85828 Personal history of other malignant neoplasm of skin: Secondary | ICD-10-CM | POA: Diagnosis not present

## 2014-05-08 DIAGNOSIS — L821 Other seborrheic keratosis: Secondary | ICD-10-CM | POA: Diagnosis not present

## 2014-06-25 DIAGNOSIS — Z961 Presence of intraocular lens: Secondary | ICD-10-CM | POA: Diagnosis not present

## 2014-06-25 DIAGNOSIS — H26491 Other secondary cataract, right eye: Secondary | ICD-10-CM | POA: Diagnosis not present

## 2014-07-20 DIAGNOSIS — T1512XA Foreign body in conjunctival sac, left eye, initial encounter: Secondary | ICD-10-CM | POA: Diagnosis not present

## 2014-08-25 ENCOUNTER — Telehealth: Payer: Self-pay | Admitting: Internal Medicine

## 2014-08-25 NOTE — Telephone Encounter (Signed)
Pre visit letter mailed 08/20/14

## 2014-09-09 ENCOUNTER — Telehealth: Payer: Self-pay | Admitting: Behavioral Health

## 2014-09-09 NOTE — Telephone Encounter (Signed)
Spoke with patient regarding pre-visit info, however the patient verbalized that he's having surgery on tomorrow and will not make his appointment. Patient rescheduled for 12/25/14.

## 2014-09-10 ENCOUNTER — Encounter: Payer: Medicare Other | Admitting: Internal Medicine

## 2014-10-05 ENCOUNTER — Telehealth: Payer: Self-pay | Admitting: Internal Medicine

## 2014-10-05 NOTE — Telephone Encounter (Signed)
Left message for pt to call back  °

## 2014-10-06 NOTE — Telephone Encounter (Signed)
Direct EGD with dilation and LEC. See previsit nurse prior. Chew food very carefully in interim

## 2014-10-06 NOTE — Telephone Encounter (Signed)
Pt scheduled for previsit 10/07/14@4pm , EGD with dil scheduled in the [place] 10/09/14@2pm . Pt aware of appt.

## 2014-10-06 NOTE — Telephone Encounter (Signed)
Pt states he is having problems with chicken and corn bread "sticking" in his throat. States he has to gag and clear his throat to get the food to move down. Pt thinks he needs to have a dilation. Can pt be a direct or does he need an OV? Please advise.

## 2014-10-07 ENCOUNTER — Telehealth: Payer: Self-pay | Admitting: Internal Medicine

## 2014-10-09 ENCOUNTER — Encounter: Payer: Medicare Other | Admitting: Internal Medicine

## 2014-11-17 ENCOUNTER — Telehealth: Payer: Self-pay | Admitting: Internal Medicine

## 2014-11-17 NOTE — Telephone Encounter (Signed)
Recommend: #1. Overall evaluation with his primary care physician ASAP to rule out non-GI causes for "not feeling well" #2. Xifaxan 550 mg 3 times a day 2 weeks for IBS related symptoms #3. Keep GI follow-up as planned

## 2014-11-17 NOTE — Telephone Encounter (Signed)
Pt knows to contact his PCP, script for [facility] and pt knows to keep GI OV.

## 2014-11-17 NOTE — Telephone Encounter (Signed)
Pt states that he has IBS-D and for the past few days he has noticed and increase in the number of BM's he is having and they are loose. States he has not been feeling well and wanted to know if there was any lab work that Dr. Henrene Pastor would want him to have. Pt is scheduled to see Cecille Rubin Hvozdovic, PA-C next Tuesday. Pt states he feels a little like he did about 10 years ago when he had a prostate infection. Pt states he is not trying to play doctor but he just does not feel as well as he normally does. Dr. Henrene Pastor please advise.

## 2014-11-18 ENCOUNTER — Encounter: Payer: Self-pay | Admitting: Internal Medicine

## 2014-11-18 ENCOUNTER — Ambulatory Visit (INDEPENDENT_AMBULATORY_CARE_PROVIDER_SITE_OTHER): Payer: Medicare Other | Admitting: Internal Medicine

## 2014-11-18 VITALS — BP 122/64 | HR 67 | Temp 97.8°F | Ht 69.0 in | Wt 180.2 lb

## 2014-11-18 DIAGNOSIS — R531 Weakness: Secondary | ICD-10-CM | POA: Diagnosis not present

## 2014-11-18 DIAGNOSIS — Z09 Encounter for follow-up examination after completed treatment for conditions other than malignant neoplasm: Secondary | ICD-10-CM

## 2014-11-18 DIAGNOSIS — N400 Enlarged prostate without lower urinary tract symptoms: Secondary | ICD-10-CM | POA: Diagnosis not present

## 2014-11-18 LAB — BASIC METABOLIC PANEL
BUN: 22 mg/dL (ref 6–23)
CO2: 27 mEq/L (ref 19–32)
Calcium: 9.5 mg/dL (ref 8.4–10.5)
Chloride: 102 mEq/L (ref 96–112)
Creatinine, Ser: 1.01 mg/dL (ref 0.40–1.50)
GFR: 76.14 mL/min (ref >60.00)
Glucose, Bld: 89 mg/dL (ref 70–99)
Potassium: 4 mEq/L (ref 3.5–5.1)
Sodium: 136 mEq/L (ref 135–145)

## 2014-11-18 LAB — POCT URINALYSIS DIPSTICK
Bilirubin, UA: NEGATIVE
Blood, UA: NEGATIVE
Glucose, UA: NEGATIVE
Ketones, UA: NEGATIVE
Leukocytes, UA: NEGATIVE
Nitrite, UA: NEGATIVE
Protein, UA: NEGATIVE
Spec Grav, UA: 1.015
Urobilinogen, UA: 0.2
pH, UA: 6

## 2014-11-18 LAB — CBC WITH DIFFERENTIAL/PLATELET
Basophils Absolute: 0.1 10*3/uL (ref 0.0–0.1)
Basophils Relative: 0.7 % (ref 0.0–3.0)
Eosinophils Absolute: 0.1 10*3/uL (ref 0.0–0.7)
Eosinophils Relative: 1.4 % (ref 0.0–5.0)
HCT: 47.2 % (ref 39.0–52.0)
Hemoglobin: 15.9 g/dL (ref 13.0–17.0)
Lymphocytes Relative: 25.6 % (ref 12.0–46.0)
Lymphs Abs: 2.5 10*3/uL (ref 0.7–4.0)
MCHC: 33.7 g/dL (ref 30.0–36.0)
MCV: 90.6 fl (ref 78.0–100.0)
Monocytes Absolute: 0.9 10*3/uL (ref 0.1–1.0)
Monocytes Relative: 9.1 % (ref 3.0–12.0)
Neutro Abs: 6.2 10*3/uL (ref 1.4–7.7)
Neutrophils Relative %: 63.2 % (ref 43.0–77.0)
Platelets: 263 10*3/uL (ref 150.0–400.0)
RBC: 5.21 Mil/uL (ref 4.22–5.81)
RDW: 13.1 % (ref 11.5–15.5)
WBC: 9.8 10*3/uL (ref 4.0–10.5)

## 2014-11-18 NOTE — Progress Notes (Signed)
Pre visit review using our clinic review tool, if applicable. No additional management support is needed unless otherwise documented below in the visit note. 

## 2014-11-18 NOTE — Progress Notes (Addendum)
Subjective:    Patient ID: Marvin Jones., male    DOB: 1935-11-13, 79 y.o.   MRN: [id number]  DOS:  11/18/2014 Type of visit - description : Acute visit Interval history: Symptoms started 4 days ago: Feeling slightly weaker than usual, + subjective fever, "the last time I felt that way was 20 years ago and I had prostatitis". Also, was seen recently with dysphagia, will see GI next week  Review of Systems Denies URI type of symptoms such as sore throat, cough, sinus pain or congestion. No nausea, vomiting. He has frequent stooling which is at baseline, stools are not watery. No blood in the stools. He has some mild discomfort at the lower abdomen bilaterally described as gas. Mild dysuria, not far from baseline. No gross hematuria, urinary flow is slightly lower as usual.  Past Medical History  Diagnosis Date  . Irritable bowel syndrome   . Barrett's esophagus   . Stricture and stenosis of esophagus   . Diaphragmatic hernia without mention of obstruction or gangrene   . Diverticulosis of colon (without mention of hemorrhage)   . Personal history of colonic polyps   . Hypertrophy of prostate with urinary obstruction and other lower urinary tract symptoms (LUTS)   . Esophageal reflux   . Anxiety and depression     h/o  . Hyperlipidemia   . Skin cancer     sees Dr Ronnald Ramp     Past Surgical History  Procedure Laterality Date  . Tonsillectomy      Social History   Social History  . Marital Status: Married    Spouse Name: N/A  . Number of Children: 3  . Years of Education: N/A   Occupational History  . retired-- Research scientist (life sciences)     Social History Main Topics  . Smoking status: Former Smoker    Types: Cigars  . Smokeless tobacco: Former Systems developer     Comment: cigars, quit ~ 2005  . Alcohol Use: 0.0 oz/week    0 Standard drinks or equivalent per week     Comment: 3 drinks per week  . Drug Use: No  . Sexual Activity: Not on file   Other Topics Concern  . Not on  file   Social History Narrative   Household-- pt and wife, marriage healthy   3 independent adult children        Medication List       This list is accurate as of: 11/18/14 11:59 PM.  Always use your most recent med list.               aspirin 81 MG tablet  Take 81 mg by mouth daily.     bismuth subsalicylate 734 LP/37TK suspension  Commonly known as:  PEPTO BISMOL  Take 30 mLs by mouth every 6 (six) hours as needed.     dicyclomine 10 MG capsule  Commonly known as:  BENTYL  TAKE AS NEEDED AS DIRECTED     omeprazole 20 MG capsule  Commonly known as:  PRILOSEC  Take 1 capsule (20 mg total) by mouth daily.     sertraline 25 MG tablet  Commonly known as:  ZOLOFT  TAKE 1 TABLET (25 MG TOTAL) BY MOUTH 2 (TWO) TIMES DAILY.     zoster vaccine live (PF) 19400 UNT/0.65ML injection  Commonly known as:  ZOSTAVAX  Inject 19,400 Units into the skin once.           Objective:   Physical Exam BP 122/64  mmHg  Pulse 67  Temp(Src) 97.8 F (36.6 C) (Oral)  Ht 5\' 9"  (1.753 m)  Wt 180 lb 4 oz (81.761 kg)  BMI 26.61 kg/m2  SpO2 95% General:   Well developed, well nourished . NAD.  HEENT:  Normocephalic . Face symmetric, atraumatic Lungs:  CTA B Normal respiratory effort, no intercostal retractions, no accessory muscle use. Heart: RRR,  no murmur.  no pretibial edema bilaterally  Abdomen:  Not distended, soft, non-tender. No rebound or rigidity. No mass,organomegaly DRE: Prostate slightly enlarged, not nodular, not particularly tender.  Skin: Not pale. Not jaundice Neurologic:  alert & oriented X3.  Speech normal, gait appropriate for age and unassisted Psych--  Cognition and judgment appear intact.  Cooperative with normal attention span and concentration.  Behavior appropriate. No anxious or depressed appearing.    Assessment & Plan:   Assessment> Hyperlipidemia BPH, LUTS GI: --IBS diarrhea/frequent stooling pre-dominant --GERD with Barretts esophagus  and stricture --Diverticulosis --Colon polyps H/o anxiety-depression Skin cancer, Dr. Ronnald Ramp  Plan Weakness: Presents today with weakness and subjective fever, BPH and IBS sx  are at baseline. Doubt acute prostatitis (see HPI) Plan: BMP, CBC, Udip neg, urine culture sent (not enough for UA). observation for now , if labs neg, will rec observation

## 2014-11-18 NOTE — Patient Instructions (Addendum)
Get your blood work before you leave    Call if not gradually feeling better in few days  Call anytime if you have  increased IBS or urinary symptoms, fever, chills, cough.    Next visit  for a physical exam at your convenience. No fasting.

## 2014-11-19 DIAGNOSIS — Z09 Encounter for follow-up examination after completed treatment for conditions other than malignant neoplasm: Secondary | ICD-10-CM | POA: Insufficient documentation

## 2014-11-19 LAB — URINE CULTURE: Colony Count: 15000

## 2014-11-19 NOTE — Assessment & Plan Note (Addendum)
Weakness: Presents today with weakness and subjective fever, BPH and IBS sx  are at baseline. Doubt acute prostatitis (see HPI) Plan: BMP, CBC, Udip neg, urine culture sent (not enough for UA). observation for now , if labs neg, will rec observation

## 2014-11-20 ENCOUNTER — Telehealth: Payer: Self-pay | Admitting: Internal Medicine

## 2014-11-20 NOTE — Telephone Encounter (Signed)
Advise patient: Urine culture was contaminated, if he continue with symptoms, come back Monday and provide another samples for a UA and urine culture. Also, blood work was normal, we are sending a Quarry manager.

## 2014-11-20 NOTE — Telephone Encounter (Signed)
Relation to UX:NATF Call back number: [phone]  Pharmacy: [id number] - [place], [place] [phone] (Phone) [phone] (Fax)         Reason for call:  Patient inquiring about urine results would like to know before the weekend.

## 2014-11-20 NOTE — Telephone Encounter (Signed)
Pt calling for lab results on urine culture before the weekend. Please call [phone].

## 2014-11-20 NOTE — Telephone Encounter (Signed)
LMOM informing Pt of blood work and urine results. Informed him to return to office Monday to drop off another urine sample if still having urinary symptoms. Instructed him to call if he has any questions or concerns.

## 2014-11-20 NOTE — Telephone Encounter (Signed)
Please advise 

## 2014-11-20 NOTE — Telephone Encounter (Signed)
Marvin Jones, CMA at 11/20/2014 10:31 AM     Status: Signed       Expand All Collapse All   LMOM informing Pt of blood work and urine results. Informed him to return to office Monday to drop off another urine sample if still having urinary symptoms. Instructed him to call if he has any questions or concerns.             Marvin Branch, MD at 11/20/2014 10:00 AM     Status: Signed       Expand All Collapse All   Advise patient: Urine culture was contaminated, if he continue with symptoms, come back Monday and provide another samples for a UA and urine culture. Also, blood work was normal, we are sending a Quarry manager.            Marvin Jones, CMA at 11/20/2014 9:26 AM     Status: Signed       Expand All Collapse All   Please advise.             Marvin Jones at 11/20/2014 8:57 AM     Status: Signed       Expand All Collapse All   Pt calling for lab results on urine culture before the weekend. Please call [phone].       LMOM for Pt earlier today at 10:31 AM at [phone].

## 2014-11-24 ENCOUNTER — Ambulatory Visit: Payer: Medicare Other | Admitting: Physician Assistant

## 2014-11-26 ENCOUNTER — Ambulatory Visit: Payer: Medicare Other | Admitting: Gastroenterology

## 2014-12-25 ENCOUNTER — Ambulatory Visit (INDEPENDENT_AMBULATORY_CARE_PROVIDER_SITE_OTHER): Payer: Medicare Other | Admitting: Internal Medicine

## 2014-12-25 ENCOUNTER — Encounter: Payer: Self-pay | Admitting: Internal Medicine

## 2014-12-25 VITALS — BP 142/72 | HR 77 | Temp 98.2°F | Ht 69.0 in | Wt 185.5 lb

## 2014-12-25 DIAGNOSIS — Z0000 Encounter for general adult medical examination without abnormal findings: Secondary | ICD-10-CM

## 2014-12-25 DIAGNOSIS — R399 Unspecified symptoms and signs involving the genitourinary system: Secondary | ICD-10-CM

## 2014-12-25 DIAGNOSIS — G629 Polyneuropathy, unspecified: Secondary | ICD-10-CM

## 2014-12-25 DIAGNOSIS — Z09 Encounter for follow-up examination after completed treatment for conditions other than malignant neoplasm: Secondary | ICD-10-CM

## 2014-12-25 LAB — VITAMIN B12: Vitamin B-12: 854 pg/mL (ref 211–911)

## 2014-12-25 LAB — HEMOGLOBIN A1C
Hgb A1c MFr Bld: 5.7 % — ABNORMAL HIGH (ref <5.7)
Mean Plasma Glucose: 117 mg/dL — ABNORMAL HIGH (ref <117)

## 2014-12-25 LAB — FOLATE: Folate: 9.5 ng/mL

## 2014-12-25 MED ORDER — ZOSTER VACCINE LIVE 19400 UNT/0.65ML ~~LOC~~ SOLR: 0.6500 mL | Freq: Once | SUBCUTANEOUS | Status: DC

## 2014-12-25 NOTE — Assessment & Plan Note (Signed)
Td 2011 Had a pneumonia shot Prevnar -2016  Zostavax, prescription provided before , another rx printed  Had a flu shot   CCS: Last colonoscopy 07/2012, next in 5 years DRE 10-2014 negative, PSA 02-2014 normal. Diet and exercise discussed

## 2014-12-25 NOTE — Patient Instructions (Signed)
Get your blood work before you leave    Next visit in one year, physical exam, fasting. Please make an appointment. Come back sooner if needed  Please consider visit these websites for more information:  www.begintheconversation.org  theconversationproject.org    Fall Prevention and Home Safety Falls cause injuries and can affect all age groups. It is possible to use preventive measures to significantly decrease the likelihood of falls. There are many simple measures which can make your home safer and prevent falls. OUTDOORS  Repair cracks and edges of walkways and driveways.  Remove high doorway thresholds.  Trim shrubbery on the main path into your home.  Have good outside lighting.  Clear walkways of tools, rocks, debris, and clutter.  Check that handrails are not broken and are securely fastened. Both sides of steps should have handrails.  Have leaves, snow, and ice cleared regularly.  Use sand or salt on walkways during winter months.  In the garage, clean up grease or oil spills. BATHROOM  Install night lights.  Install grab bars by the toilet and in the tub and shower.  Use non-skid mats or decals in the tub or shower.  Place a plastic non-slip stool in the shower to sit on, if needed.  Keep floors dry and clean up all water on the floor immediately.  Remove soap buildup in the tub or shower on a regular basis.  Secure bath mats with non-slip, double-sided rug tape.  Remove throw rugs and tripping hazards from the floors. BEDROOMS  Install night lights.  Make sure a bedside light is easy to reach.  Do not use oversized bedding.  Keep a telephone by your bedside.  Have a firm chair with side arms to use for getting dressed.  Remove throw rugs and tripping hazards from the floor. KITCHEN  Keep handles on pots and pans turned toward the center of the stove. Use back burners when possible.  Clean up spills quickly and allow time for  drying.  Avoid walking on wet floors.  Avoid hot utensils and knives.  Position shelves so they are not too high or low.  Place commonly used objects within easy reach.  If necessary, use a sturdy step stool with a grab bar when reaching.  Keep electrical cables out of the way.  Do not use floor polish or wax that makes floors slippery. If you must use wax, use non-skid floor wax.  Remove throw rugs and tripping hazards from the floor. STAIRWAYS  Never leave objects on stairs.  Place handrails on both sides of stairways and use them. Fix any loose handrails. Make sure handrails on both sides of the stairways are as long as the stairs.  Check carpeting to make sure it is firmly attached along stairs. Make repairs to worn or loose carpet promptly.  Avoid placing throw rugs at the top or bottom of stairways, or properly secure the rug with carpet tape to prevent slippage. Get rid of throw rugs, if possible.  Have an electrician put in a light switch at the top and bottom of the stairs. OTHER FALL PREVENTION TIPS  Wear low-heel or rubber-soled shoes that are supportive and fit well. Wear closed toe shoes.  When using a stepladder, make sure it is fully opened and both spreaders are firmly locked. Do not climb a closed stepladder.  Add color or contrast paint or tape to grab bars and handrails in your home. Place contrasting color strips on first and last steps.  Learn and use  mobility aids as needed. Install an electrical emergency response system.  Turn on lights to avoid dark areas. Replace light bulbs that burn out immediately. Get light switches that glow.  Arrange furniture to create clear pathways. Keep furniture in the same place.  Firmly attach carpet with non-skid or double-sided tape.  Eliminate uneven floor surfaces.  Select a carpet pattern that does not visually hide the edge of steps.  Be aware of all pets. OTHER HOME SAFETY TIPS  Set the water temperature  for 120 F (48.8 C).  Keep emergency numbers on or near the telephone.  Keep smoke detectors on every level of the home and near sleeping areas. Document Released: 01/27/2002 Document Revised: 08/08/2011 Document Reviewed: 04/28/2011 [facility] Patient Information 2015 [place], [place]. This information is not intended to replace advice given to you by your health care provider. Make sure you discuss any questions you have with your health care provider.   Preventive Care for Adults Ages 47 and over  Blood pressure check.** / Every 1 to 2 years.  Lipid and cholesterol check.**/ Every 5 years beginning at age 60.  Lung cancer screening. / Every year if you are aged 108-80 years and have a 30-pack-year history of smoking and currently smoke or have quit within the past 15 years. Yearly screening is stopped once you have quit smoking for at least 15 years or develop a health problem that would prevent you from having lung cancer treatment.  Fecal occult blood test (FOBT) of stool. / Every year beginning at age 65 and continuing until age 73. You may not have to do this test if you get a colonoscopy every 10 years.  Flexible sigmoidoscopy** or colonoscopy.** / Every 5 years for a flexible sigmoidoscopy or every 10 years for a colonoscopy beginning at age 24 and continuing until age 92.  Hepatitis C blood test.** / For all people born from [birth year] through 1963 and any individual with known risks for hepatitis C.  Abdominal aortic aneurysm (AAA) screening.** / A one-time screening for ages 76 to 74 years who are current or former smokers.  Skin self-exam. / Monthly.  Influenza vaccine. / Every year.  Tetanus, diphtheria, and acellular pertussis (Tdap/Td) vaccine.** / 1 dose of Td every 10 years.  Varicella vaccine.** / Consult your health care provider.  Zoster vaccine.** / 1 dose for adults aged 76 years or older.  Pneumococcal 13-valent conjugate (PCV13) vaccine.** / Consult your health  care provider.  Pneumococcal polysaccharide (PPSV23) vaccine.** / 1 dose for all adults aged 50 years and older.  Meningococcal vaccine.** / Consult your health care provider.  Hepatitis A vaccine.** / Consult your health care provider.  Hepatitis B vaccine.** / Consult your health care provider.  Haemophilus influenzae type b (Hib) vaccine.** / Consult your health care provider. **Family history and personal history of risk and conditions may change your health care provider's recommendations. Document Released: 04/04/2001 Document Revised: 02/11/2013 Document Reviewed: 07/04/2010 [facility] Patient Information 2015 [place], [place]. This information is not intended to replace advice given to you by your health care provider. Make sure you discuss any questions you have with your health care provider.

## 2014-12-25 NOTE — Progress Notes (Signed)
Pre visit review using our clinic review tool, if applicable. No additional management support is needed unless otherwise documented below in the visit note. 

## 2014-12-25 NOTE — Progress Notes (Signed)
Subjective:    Patient ID: Marvin Jones., male    DOB: 1936/02/01, 79 y.o.   MRN: [id number]  DOS:  12/25/2014 Type of visit - description :  Here for Medicare AWV:  1. Risk factors based on Past M, S, F history: reviewed 2. Physical Activities:  Very active, yard work, fishing, golf 3. Depression/mood: neg screening  4. Hearing:  No problems noted or reported  5. ADL's: independent, drives  6. Fall Risk: no recent falls, prevention discussed , see AVS 7. home Safety: does feel safe at home  8. Height, weight, & visual acuity: see VS, sees eye doctor regulalrly 9. Counseling: provided 10. Labs ordered based on risk factors: if needed  11. Referral Coordination: if needed 12. Care Plan, see assessment and plan , written personalized plan provided , see AVS 13. Cognitive Assessment: motor skills and cognition appropriate for age 49. Care team updated   15. End-of-life care discussed , rec a HC POA  In addition, today we discussed the following:  neuropathy?  His health insurance nurse   visited him, she felt the patient has some degree of neuropathy. The patient reports occasional burning on his feet. Anxiety depression: Self discontinue SSRIs, does not feel that they were helping, feels well. Recently seen with weakness: Symptoms self resolved   Review of Systems  Constitutional: No fever. No chills. No unexplained wt changes. No unusual sweats  HEENT: No dental problems, no ear discharge, no facial swelling, no voice changes. No eye discharge, no eye  redness , no  intolerance to light   Respiratory: No wheezing , no  difficulty breathing. No cough , no mucus production  Cardiovascular: No CP, no leg swelling , no  Palpitations  GI: Has IBS, symptoms at baseline occasional lower abdominal pain. No nausea vomiting  No blood in the stools. No dysphagia, no odynophagia    Endocrine: No polyphagia, no polyuria , no polydipsia  GU: No dysuria, gross hematuria,  difficulty urinating. No urinary urgency, no frequency. + Nocturia and baseline, history of BPH  Musculoskeletal: No joint swellings or unusual aches or pains  Skin: No change in the color of the skin, palor , no  Rash  Allergic, immunologic: No environmental allergies , no  food allergies  Neurological: No dizziness no  syncope. No headaches. No diplopia, no slurred, no slurred speech, no motor deficits, no facial  Numbness  Hematological: No enlarged lymph nodes, no easy bruising , no unusual bleedings  Psychiatry: No suicidal ideas, no hallucinations, no beavior problems, no confusion.  No unusual/severe anxiety, no depression  Past Medical History  Diagnosis Date  . Irritable bowel syndrome   . Barrett's esophagus   . Stricture and stenosis of esophagus   . Diaphragmatic hernia without mention of obstruction or gangrene   . Diverticulosis of colon (without mention of hemorrhage)   . Personal history of colonic polyps   . Hypertrophy of prostate with urinary obstruction and other lower urinary tract symptoms (LUTS)   . Esophageal reflux   . Anxiety and depression     h/o  . Hyperlipidemia   . Skin cancer     sees Dr Ronnald Ramp     Past Surgical History  Procedure Laterality Date  . Tonsillectomy      Social History   Social History  . Marital Status: Married    Spouse Name: N/A  . Number of Children: 3  . Years of Education: N/A   Occupational History  . retired--  Research scientist (life sciences)     Social History Main Topics  . Smoking status: Former Smoker    Types: Cigars  . Smokeless tobacco: Former Systems developer     Comment: cigars, quit ~ 2005  . Alcohol Use: 0.0 oz/week    0 Standard drinks or equivalent per week     Comment: 3 drinks per week  . Drug Use: No  . Sexual Activity: Not on file   Other Topics Concern  . Not on file   Social History Narrative   Household-- pt and wife, marriage healthy   3 independent adult children     Family History  Problem Relation Age  of Onset  . Heart attack Father 91  . Colon cancer Neg Hx   . Diabetes Son   . Prostate cancer Neg Hx   . Hypertension Neg Hx        Medication List       This list is accurate as of: 12/25/14 11:59 PM.  Always use your most recent med list.               aspirin 81 MG tablet  Take 81 mg by mouth daily.     bismuth subsalicylate 803 OZ/22QM suspension  Commonly known as:  PEPTO BISMOL  Take 30 mLs by mouth every 6 (six) hours as needed.     dicyclomine 10 MG capsule  Commonly known as:  BENTYL  TAKE AS NEEDED AS DIRECTED     omeprazole 20 MG capsule  Commonly known as:  PRILOSEC  Take 1 capsule (20 mg total) by mouth daily.     sertraline 25 MG tablet  Commonly known as:  ZOLOFT  TAKE 1 TABLET (25 MG TOTAL) BY MOUTH 2 (TWO) TIMES DAILY.     zoster vaccine live (PF) 19400 UNT/0.65ML injection  Commonly known as:  ZOSTAVAX  Inject 19,400 Units into the skin once.           Objective:   Physical Exam BP 142/72 mmHg  Pulse 77  Temp(Src) 98.2 F (36.8 C) (Oral)  Ht 5\' 9"  (1.753 m)  Wt 185 lb 8 oz (84.142 kg)  BMI 27.38 kg/m2  SpO2 93% General:   Well developed, well nourished . NAD.  HEENT:  Normocephalic . Face symmetric, atraumatic Neck: No thyromegaly. Lungs:  CTA B Normal respiratory effort, no intercostal retractions, no accessory muscle use. Heart: RRR,  no murmur.  no pretibial edema bilaterally  Abdomen:  Not distended, soft, non-tender. No rebound or rigidity. No bruit  Skin: Not pale. Not jaundice Neurologic:  alert & oriented X3.  Speech normal, gait appropriate for age and unassisted. Pinprick examination of the foot, patches of decreased sensitivity noted (mild) Psych--  Cognition and judgment appear intact.  Cooperative with normal attention span and concentration.  Behavior appropriate. No anxious or depressed appearing.    Assessment & Plan:   Assessment> Hyperlipidemia BPH, LUTS GI: --IBS diarrhea/frequent stooling  pre-dominant --GERD with Barretts esophagus and stricture --Diverticulosis --Colon polyps H/o anxiety-depression Skin cancer, Dr. Ronnald Ramp +FH CAD father   Plan Hyperlipidemia: Last FLP satisfactory, on no medications. BPH: Symptoms at baseline, last urine culture contaminated, will repeat. Neuropathy? Mild neuropathy based on sx and exam. Check a G5O, I37, folic acid. Feet care discussed Weakness: recent sxsolved Anxiety depression: Self discontinue SSRIs, feeling well.

## 2014-12-26 LAB — URINE CULTURE
Colony Count: NO GROWTH
Organism ID, Bacteria: NO GROWTH

## 2014-12-26 NOTE — Assessment & Plan Note (Signed)
Hyperlipidemia: Last FLP satisfactory, on no medications. BPH: Symptoms at baseline, last urine culture contaminated, will repeat. Neuropathy? Mild neuropathy based on sx and exam. Check a E2C, M03, folic acid. Feet care discussed Weakness: recent sxsolved

## 2014-12-28 ENCOUNTER — Encounter: Payer: Self-pay | Admitting: *Deleted

## 2015-01-30 ENCOUNTER — Other Ambulatory Visit: Payer: Self-pay | Admitting: Internal Medicine

## 2015-05-04 ENCOUNTER — Other Ambulatory Visit: Payer: Self-pay | Admitting: Internal Medicine

## 2015-10-13 ENCOUNTER — Inpatient Hospital Stay (HOSPITAL_COMMUNITY)
Admission: EM | Admit: 2015-10-13 | Discharge: 2015-10-16 | DRG: 698 | Disposition: A | Discharge status: Home / Self Care | Payer: Medicare Other | Attending: Family Medicine | Admitting: Family Medicine

## 2015-10-13 ENCOUNTER — Encounter (HOSPITAL_COMMUNITY): Payer: Self-pay

## 2015-10-13 DIAGNOSIS — K589 Irritable bowel syndrome without diarrhea: Secondary | ICD-10-CM | POA: Diagnosis present

## 2015-10-13 DIAGNOSIS — T83518A Infection and inflammatory reaction due to other urinary catheter, initial encounter: Secondary | ICD-10-CM | POA: Diagnosis not present

## 2015-10-13 DIAGNOSIS — N400 Enlarged prostate without lower urinary tract symptoms: Secondary | ICD-10-CM | POA: Diagnosis present

## 2015-10-13 DIAGNOSIS — R338 Other retention of urine: Secondary | ICD-10-CM | POA: Diagnosis present

## 2015-10-13 DIAGNOSIS — Z87891 Personal history of nicotine dependence: Secondary | ICD-10-CM

## 2015-10-13 DIAGNOSIS — A419 Sepsis, unspecified organism: Secondary | ICD-10-CM | POA: Diagnosis present

## 2015-10-13 DIAGNOSIS — E785 Hyperlipidemia, unspecified: Secondary | ICD-10-CM | POA: Diagnosis present

## 2015-10-13 DIAGNOSIS — T83511A Infection and inflammatory reaction due to indwelling urethral catheter, initial encounter: Secondary | ICD-10-CM

## 2015-10-13 DIAGNOSIS — F329 Major depressive disorder, single episode, unspecified: Secondary | ICD-10-CM | POA: Diagnosis present

## 2015-10-13 DIAGNOSIS — N179 Acute kidney failure, unspecified: Secondary | ICD-10-CM | POA: Diagnosis present

## 2015-10-13 DIAGNOSIS — K5730 Diverticulosis of large intestine without perforation or abscess without bleeding: Secondary | ICD-10-CM | POA: Diagnosis present

## 2015-10-13 DIAGNOSIS — K219 Gastro-esophageal reflux disease without esophagitis: Secondary | ICD-10-CM | POA: Diagnosis present

## 2015-10-13 DIAGNOSIS — Z79899 Other long term (current) drug therapy: Secondary | ICD-10-CM

## 2015-10-13 DIAGNOSIS — B9620 Unspecified Escherichia coli [E. coli] as the cause of diseases classified elsewhere: Secondary | ICD-10-CM

## 2015-10-13 DIAGNOSIS — Z85828 Personal history of other malignant neoplasm of skin: Secondary | ICD-10-CM

## 2015-10-13 DIAGNOSIS — R911 Solitary pulmonary nodule: Secondary | ICD-10-CM | POA: Diagnosis present

## 2015-10-13 DIAGNOSIS — R531 Weakness: Secondary | ICD-10-CM | POA: Diagnosis not present

## 2015-10-13 DIAGNOSIS — Y846 Urinary catheterization as the cause of abnormal reaction of the patient, or of later complication, without mention of misadventure at the time of the procedure: Secondary | ICD-10-CM | POA: Diagnosis present

## 2015-10-13 DIAGNOSIS — Z86010 Personal history of colonic polyps: Secondary | ICD-10-CM

## 2015-10-13 DIAGNOSIS — R7881 Bacteremia: Secondary | ICD-10-CM

## 2015-10-13 DIAGNOSIS — R4182 Altered mental status, unspecified: Secondary | ICD-10-CM | POA: Diagnosis present

## 2015-10-13 DIAGNOSIS — Z7982 Long term (current) use of aspirin: Secondary | ICD-10-CM

## 2015-10-13 DIAGNOSIS — F419 Anxiety disorder, unspecified: Secondary | ICD-10-CM | POA: Diagnosis present

## 2015-10-13 DIAGNOSIS — N401 Enlarged prostate with lower urinary tract symptoms: Secondary | ICD-10-CM | POA: Diagnosis present

## 2015-10-13 DIAGNOSIS — N390 Urinary tract infection, site not specified: Secondary | ICD-10-CM | POA: Diagnosis present

## 2015-10-13 LAB — CBC WITH DIFFERENTIAL/PLATELET
Basophils Absolute: 0 10*3/uL (ref 0.0–0.1)
Basophils Relative: 0 %
Eosinophils Absolute: 0 10*3/uL (ref 0.0–0.7)
Eosinophils Relative: 0 %
HCT: 44.6 % (ref 39.0–52.0)
Hemoglobin: 15.4 g/dL (ref 13.0–17.0)
Lymphocytes Relative: 3 %
Lymphs Abs: 0.6 10*3/uL — ABNORMAL LOW (ref 0.7–4.0)
MCH: 30.5 pg (ref 26.0–34.0)
MCHC: 34.5 g/dL (ref 30.0–36.0)
MCV: 88.3 fL (ref 78.0–100.0)
Monocytes Absolute: 0.9 10*3/uL (ref 0.1–1.0)
Monocytes Relative: 5 %
Neutro Abs: 17.8 10*3/uL — ABNORMAL HIGH (ref 1.7–7.7)
Neutrophils Relative %: 92 %
Platelets: 198 10*3/uL (ref 150–400)
RBC: 5.05 MIL/uL (ref 4.22–5.81)
RDW: 12.9 % (ref 11.5–15.5)
WBC: 19.3 10*3/uL — ABNORMAL HIGH (ref 4.0–10.5)

## 2015-10-13 LAB — I-STAT CG4 LACTIC ACID, ED: Lactic Acid, Venous: 2.45 mmol/L (ref 0.5–1.9)

## 2015-10-13 NOTE — ED Provider Notes (Signed)
[facility] DEPT Provider Note   CSN: [id number] Arrival date & time: 10/13/15  2313  By signing my name below, I, Arianna Nassar, attest that this documentation has been prepared under the direction and in the presence of Merryl Hacker, MD.  Electronically Signed: Julien Nordmann, ED Scribe. 10/13/15. 11:59 PM.    History   Chief Complaint Chief Complaint  Patient Marvin with  . Fever  . Weakness    The history is provided by the patient. No language interpreter was used.   HPI Comments: Marvin Haefs. is a 80 y.o. male who has a PMhx of diverticulosis of Jones, Marvin Jones, Marvin Jones, Marvin Jones,Marvin Jones, gradual improving, moderate weakness Jones today. He notes associated chills, diaphoresis, confusion, fever (tmax 102), and states he did not feel like himself and says he felt "crazy". He states having a catheter removed from his penis today. He says is penis has been feeling very sore and he wasn't feeling well at dinner. He notes when he went home his symptoms began to start. Pt says he took some tylenol and was instructed by his urologist to take two flomax when he usually takes one. He says he think he may be having a reaction due to increased doses. Pt denies any current pain.  Past Medical History:  Diagnosis Date  . Anxiety and depression    h/o  . Barrett's esophagus   . Diaphragmatic hernia without mention of obstruction or gangrene   . Diverticulosis of Jones (without mention of hemorrhage)   . Esophageal reflux   . Hyperlipidemia   . Hypertrophy of prostate with urinary obstruction and other lower urinary tract symptoms (LUTS)   . Irritable bowel syndrome   . Personal history of colonic polyps   . Skin cancer    sees Dr Ronnald Ramp   . Stricture and stenosis of esophagus     Patient Active Problem List   Diagnosis Date Noted  . PCP NOTES >>>>>>>>>>>> 11/19/2014  . Anxiety and depression 03/09/2014  . Annual physical  exam 03/09/2014  . Toe avulsion 08/06/2011  . BARRETTS ESOPHAGUS 03/04/2010  . IRRITABLE BOWEL SYNDROME 03/04/2010  . ESOPHAGEAL STRICTURE 12/16/2009  . HIATAL HERNIA WITH REFLUX 12/16/2009  . DIVERTICULOSIS, Jones 12/16/2009  . COLONIC POLYPS, ADENOMATOUS, HX OF 12/16/2009  . Hyperlipidemia 11/23/2005  . Marvin Jones 11/23/2005  . BPH (benign prostatic hyperplasia) 11/23/2005    Past Surgical History:  Procedure Laterality Date  . TONSILLECTOMY         Home Medications    Prior to Admission medications   Medication Sig Start Date End Date Taking? Authorizing Provider  acetaminophen (TYLENOL) 500 MG tablet Take 1,000 mg by mouth every 6 (six) hours as needed for moderate pain.   Yes Historical Provider, MD  aspirin 81 MG tablet Take 81 mg by mouth daily.     Yes Historical Provider, MD  bismuth subsalicylate (PEPTO BISMOL) 262 MG/15ML suspension Take 30 mLs by mouth every 6 (six) hours as needed for indigestion.    Yes Historical Provider, MD  tamsulosin (FLOMAX) 0.4 MG CAPS capsule Take 1 capsule by mouth daily. 10/13/15  Yes Historical Provider, MD  dicyclomine (BENTYL) 10 MG capsule TAKE AS NEEDED AS DIRECTED Patient taking differently: TAKE 1 CAPSULE DAILY AS NEEDED 02/04/14   Irene Shipper, MD  dicyclomine (BENTYL) 10 MG capsule TAKE AS NEEDED AS DIRECTED Patient not taking: Reported on 10/14/2015 05/04/15   Irene Shipper, MD  omeprazole ([place]) 20  MG capsule Take 1 capsule (20 mg total) by mouth daily. Patient not taking: Reported on 10/14/2015 07/23/12   Irene Shipper, MD  sertraline (ZOLOFT) 25 MG tablet TAKE 1 TABLET (25 MG TOTAL) BY MOUTH 2 (TWO) TIMES DAILY. Patient not taking: Reported on 12/25/2014 02/24/13   Irene Shipper, MD  zoster vaccine live, PF, (ZOSTAVAX) 96295 UNT/0.65ML injection Inject 19,400 Units into the skin once. 12/25/14   Jones Branch, MD    Family History Family History  Problem Relation Age of Jones  . Heart attack Father 5  . Diabetes Son   . Jones cancer  Neg Hx   . Prostate cancer Neg Hx   . Hypertension Neg Hx     Social History Social History  Substance Use Topics  . Smoking status: Former Smoker    Types: Cigars  . Smokeless tobacco: Former Systems developer     Comment: cigars, quit ~ 2005  . Alcohol use 0.0 oz/week     Comment: 3 drinks per week     Allergies   Review of patient's allergies indicates no known allergies.   Review of Systems Review of Systems  Constitutional: Positive for chills and fever.  Respiratory: Negative for cough and shortness of breath.   Cardiovascular: Negative for chest pain.  Gastrointestinal: Negative for abdominal pain, nausea and vomiting.  Genitourinary: Positive for penile pain. Negative for dysuria.  Neurological: Positive for weakness.  All other systems reviewed and are negative.    Physical Exam Updated Vital Signs BP 130/71 (BP Location: Right Arm)   Pulse 94   Temp 98.6 F (37 C) (Oral)   Resp 20   Ht 5\' 9"  (1.753 m)   Wt 180 lb (81.6 kg)   SpO2 95%   BMI 26.58 kg/m   Physical Exam  Constitutional: He is oriented to person, place, and time. He appears well-developed and well-nourished. No distress.  HENT:  Head: Normocephalic and atraumatic.  Cardiovascular: Normal rate, regular rhythm and normal heart sounds.   No murmur heard. Pulmonary/Chest: Effort normal and breath sounds normal. No respiratory distress. He has no wheezes.  Abdominal: Soft. Bowel sounds are normal. There is no tenderness. There is no rebound.  Genitourinary: Penis normal.  Musculoskeletal: He exhibits no edema.  Lymphadenopathy:    He has no cervical adenopathy.  Neurological: He is alert and oriented to person, place, and time.  Skin: Skin is warm and dry.  Psychiatric: He has a normal mood and affect.  Anxious appearing  Nursing note and vitals reviewed.    ED Treatments / Results  DIAGNOSTIC STUDIES: Oxygen Saturation is 95% on RA, adequate by my interpretation.  COORDINATION OF CARE:    11:58 PM Discussed treatment plan with pt at bedside and pt agreed to plan.  Labs (all labs ordered are listed, but only abnormal results are displayed) Labs Reviewed  CBC WITH DIFFERENTIAL/PLATELET - Abnormal; Notable for the following:       Result Value   WBC 19.3 (*)    Neutro Abs 17.8 (*)    Lymphs Abs 0.6 (*)    All other components within normal limits  COMPREHENSIVE METABOLIC PANEL - Abnormal; Notable for the following:    Sodium 134 (*)    Glucose, Bld 106 (*)    Creatinine, Ser 1.28 (*)    Total Bilirubin 1.5 (*)    GFR calc non Af Amer 52 (*)    All other components within normal limits  URINALYSIS, ROUTINE W REFLEX MICROSCOPIC (NOT AT  [facility]) - Abnormal; Notable for the following:    APPearance TURBID (*)    Hgb urine dipstick LARGE (*)    Protein, ur 100 (*)    Nitrite POSITIVE (*)    Leukocytes, UA LARGE (*)    All other components within normal limits  URINE MICROSCOPIC-ADD ON - Abnormal; Notable for the following:    Bacteria, UA MANY (*)    All other components within normal limits  I-STAT CG4 LACTIC ACID, ED - Abnormal; Notable for the following:    Lactic Acid, Venous 2.45 (*)    All other components within normal limits  URINE CULTURE  CULTURE, BLOOD (ROUTINE X 2)  CULTURE, BLOOD (ROUTINE X 2)    EKG  EKG Interpretation  Date/Time:  Thursday October 14 2015 00:15:49 EDT Ventricular Rate:  99 PR Interval:    QRS Duration: 95 QT Interval:  348 QTC Calculation: 447 R Axis:   -52 Text Interpretation:  Sinus rhythm Left anterior fascicular block Abnormal R-wave progression, early transition No prior for comparison Confirmed by HORTON  MD, Loma Sousa ([id number]) on 10/14/2015 1:14:03 AM       Radiology Dg Chest 2 View  Result Date: 10/14/2015 CLINICAL DATA:  80 y/o M; patient had a catheter removed today with Jones of fever and weakness several hours ago. Patient believes to have a urinary tract infection. EXAM: CHEST  2 VIEW COMPARISON:  None. FINDINGS:  Normal cardiomediastinal silhouette given projection and technique. No consolidation, pneumothorax, or pleural effusion. No acute osseous abnormality is evident. 10 mm density projects over the right mid lung zone of uncertain significance. IMPRESSION: 1. No active cardiopulmonary disease. 2. 10 mm nonspecific nodular density projects over the right mid lung zone, chest CT is recommended on a nonemergent basis to characterize. Electronically Signed   By: Kristine Garbe M.D.   On: 10/14/2015 01:22    Procedures Procedures (including critical care time)  Medications Ordered in ED Medications  sodium chloride 0.9 % bolus 1,000 mL (1,000 mLs Intravenous New Bag/Given 10/14/15 0019)  sodium chloride 0.9 % bolus 1,000 mL (0 mLs Intravenous Stopped 10/14/15 0114)    And  sodium chloride 0.9 % bolus 1,000 mL (1,000 mLs Intravenous New Bag/Given 10/14/15 0115)  cefTRIAXone (ROCEPHIN) 1 g in dextrose 5 % 50 mL IVPB (1 g Intravenous New Bag/Given 10/14/15 0053)     Initial Impression / Assessment and Plan / ED Course  I have reviewed the triage vital signs and the nursing notes.  Pertinent labs & imaging results that were available during my care of the patient were reviewed by me and considered in my medical decision making (see chart for details).  Clinical Course   Patient Marvin with fever, chills.  Recent catheter for urinary retention.  Patient denies any symptoms at this time. Currently afebrile. He is well-appearing. Vital signs are reassuring. Sepsis workup was initiated given recent catheter and fever reported. Lactate mildly elevated. White count 19.3 with a left shift. Suspect urinary source. Patient was given 30 mL/kg of fluid and Rocephin for likely UTI. Urinalysis does show too numerous to count white cells and is nitrite positive. Given patient's age and evidence of early urosepsis, will admit for IV antibiotics.   Final Clinical Impressions(s) / ED Diagnoses   Final  diagnoses:  UTI (lower urinary tract infection)  Sepsis, due to unspecified organism [facility])    New Prescriptions New Prescriptions   No medications on file   I personally performed the services described in this documentation, which  was scribed in my presence. The recorded information has been reviewed and is accurate.    Merryl Hacker, MD 10/14/15 [id number]

## 2015-10-13 NOTE — ED Notes (Signed)
Bed: [id number] Expected date:  Expected time:  Means of arrival:  Comments: EMS 80 yo male fever 102/weakness

## 2015-10-13 NOTE — ED Notes (Signed)
Pt states that he went out to eat tonight and when he got home he started feeling bad and was sweating and having cold chills, he was instructed by Dr to take two flomax capsules because he hadnt urinated since earlier, pt states that he doesn't feel like his bladder is full currently. He also took two tylenol prior to arrival

## 2015-10-13 NOTE — ED Notes (Signed)
Pt had a urinary catheter taken out this morning ans had urinated 5 times since then, this evening he developed a fever at home of 102.

## 2015-10-14 ENCOUNTER — Emergency Department (HOSPITAL_COMMUNITY): Payer: Medicare Other

## 2015-10-14 DIAGNOSIS — Z87891 Personal history of nicotine dependence: Secondary | ICD-10-CM | POA: Diagnosis not present

## 2015-10-14 DIAGNOSIS — K5730 Diverticulosis of large intestine without perforation or abscess without bleeding: Secondary | ICD-10-CM | POA: Diagnosis present

## 2015-10-14 DIAGNOSIS — N179 Acute kidney failure, unspecified: Secondary | ICD-10-CM | POA: Diagnosis present

## 2015-10-14 DIAGNOSIS — A419 Sepsis, unspecified organism: Secondary | ICD-10-CM | POA: Diagnosis present

## 2015-10-14 DIAGNOSIS — Y846 Urinary catheterization as the cause of abnormal reaction of the patient, or of later complication, without mention of misadventure at the time of the procedure: Secondary | ICD-10-CM | POA: Diagnosis present

## 2015-10-14 DIAGNOSIS — R911 Solitary pulmonary nodule: Secondary | ICD-10-CM | POA: Diagnosis present

## 2015-10-14 DIAGNOSIS — R338 Other retention of urine: Secondary | ICD-10-CM | POA: Diagnosis present

## 2015-10-14 DIAGNOSIS — E785 Hyperlipidemia, unspecified: Secondary | ICD-10-CM | POA: Diagnosis present

## 2015-10-14 DIAGNOSIS — Z7982 Long term (current) use of aspirin: Secondary | ICD-10-CM | POA: Diagnosis not present

## 2015-10-14 DIAGNOSIS — R7881 Bacteremia: Secondary | ICD-10-CM | POA: Diagnosis not present

## 2015-10-14 DIAGNOSIS — T83511A Infection and inflammatory reaction due to indwelling urethral catheter, initial encounter: Secondary | ICD-10-CM

## 2015-10-14 DIAGNOSIS — B9620 Unspecified Escherichia coli [E. coli] as the cause of diseases classified elsewhere: Secondary | ICD-10-CM | POA: Diagnosis present

## 2015-10-14 DIAGNOSIS — R531 Weakness: Secondary | ICD-10-CM | POA: Diagnosis present

## 2015-10-14 DIAGNOSIS — N400 Enlarged prostate without lower urinary tract symptoms: Secondary | ICD-10-CM | POA: Diagnosis not present

## 2015-10-14 DIAGNOSIS — K589 Irritable bowel syndrome without diarrhea: Secondary | ICD-10-CM | POA: Diagnosis present

## 2015-10-14 DIAGNOSIS — Z85828 Personal history of other malignant neoplasm of skin: Secondary | ICD-10-CM | POA: Diagnosis not present

## 2015-10-14 DIAGNOSIS — R4182 Altered mental status, unspecified: Secondary | ICD-10-CM | POA: Diagnosis present

## 2015-10-14 DIAGNOSIS — F419 Anxiety disorder, unspecified: Secondary | ICD-10-CM | POA: Diagnosis present

## 2015-10-14 DIAGNOSIS — N390 Urinary tract infection, site not specified: Secondary | ICD-10-CM | POA: Diagnosis present

## 2015-10-14 DIAGNOSIS — T83518A Infection and inflammatory reaction due to other urinary catheter, initial encounter: Secondary | ICD-10-CM | POA: Diagnosis present

## 2015-10-14 DIAGNOSIS — Z79899 Other long term (current) drug therapy: Secondary | ICD-10-CM | POA: Diagnosis not present

## 2015-10-14 DIAGNOSIS — N401 Enlarged prostate with lower urinary tract symptoms: Secondary | ICD-10-CM | POA: Diagnosis present

## 2015-10-14 DIAGNOSIS — K219 Gastro-esophageal reflux disease without esophagitis: Secondary | ICD-10-CM | POA: Diagnosis present

## 2015-10-14 DIAGNOSIS — Z86010 Personal history of colonic polyps: Secondary | ICD-10-CM | POA: Diagnosis not present

## 2015-10-14 DIAGNOSIS — F329 Major depressive disorder, single episode, unspecified: Secondary | ICD-10-CM | POA: Diagnosis present

## 2015-10-14 LAB — BLOOD CULTURE ID PANEL (REFLEXED)

## 2015-10-14 LAB — URINALYSIS, ROUTINE W REFLEX MICROSCOPIC
Bilirubin Urine: NEGATIVE
Glucose, UA: NEGATIVE mg/dL
Ketones, ur: NEGATIVE mg/dL
Nitrite: POSITIVE — AB
Protein, ur: 100 mg/dL — AB
Specific Gravity, Urine: 1.013 (ref 1.005–1.030)
pH: 6.5 (ref 5.0–8.0)

## 2015-10-14 LAB — URINE MICROSCOPIC-ADD ON: Squamous Epithelial / LPF: NONE SEEN

## 2015-10-14 LAB — COMPREHENSIVE METABOLIC PANEL
ALT: 26 U/L (ref 17–63)
AST: 35 U/L (ref 15–41)
Albumin: 4.2 g/dL (ref 3.5–5.0)
Alkaline Phosphatase: 53 U/L (ref 38–126)
Anion gap: 7 (ref 5–15)
BUN: 18 mg/dL (ref 6–20)
CO2: 23 mmol/L (ref 22–32)
Calcium: 9.2 mg/dL (ref 8.9–10.3)
Chloride: 104 mmol/L (ref 101–111)
Creatinine, Ser: 1.28 mg/dL — ABNORMAL HIGH (ref 0.61–1.24)
GFR calc Af Amer: >60 mL/min (ref >60)
GFR calc non Af Amer: 52 mL/min — ABNORMAL LOW (ref >60)
Glucose, Bld: 106 mg/dL — ABNORMAL HIGH (ref 65–99)
Potassium: 3.8 mmol/L (ref 3.5–5.1)
Sodium: 134 mmol/L — ABNORMAL LOW (ref 135–145)
Total Bilirubin: 1.5 mg/dL — ABNORMAL HIGH (ref 0.3–1.2)
Total Protein: 7.5 g/dL (ref 6.5–8.1)

## 2015-10-14 LAB — LACTIC ACID, PLASMA
Lactic Acid, Venous: 3.2 mmol/L (ref 0.5–1.9)
Lactic Acid, Venous: 1.3 mmol/L (ref 0.5–1.9)

## 2015-10-14 MED ORDER — ASPIRIN 81 MG PO CHEW
81.0000 mg | CHEWABLE_TABLET | Freq: Every day | ORAL | Status: DC
  Administered 2015-10-14 – 2015-10-16 (×3): 81 mg via ORAL
  Filled 2015-10-14 (×3): qty 1

## 2015-10-14 MED ORDER — DEXTROSE 5 % IV SOLN
1.0000 g | INTRAVENOUS | Status: DC
  Filled 2015-10-14: qty 10

## 2015-10-14 MED ORDER — CEFTRIAXONE SODIUM 1 G IJ SOLR
1.0000 g | Freq: Once | INTRAMUSCULAR | Status: AC
  Administered 2015-10-14: 1 g via INTRAVENOUS
  Filled 2015-10-14: qty 10

## 2015-10-14 MED ORDER — SODIUM CHLORIDE 0.9 % IV BOLUS (SEPSIS)
500.0000 mL | Freq: Once | INTRAVENOUS | Status: AC
  Administered 2015-10-14: 500 mL via INTRAVENOUS

## 2015-10-14 MED ORDER — SODIUM CHLORIDE 0.9 % IV BOLUS (SEPSIS)
1000.0000 mL | Freq: Once | INTRAVENOUS | Status: AC
  Administered 2015-10-14: 1000 mL via INTRAVENOUS

## 2015-10-14 MED ORDER — TAMSULOSIN HCL 0.4 MG PO CAPS
0.4000 mg | ORAL_CAPSULE | Freq: Every day | ORAL | Status: DC
  Administered 2015-10-14 – 2015-10-16 (×3): 0.4 mg via ORAL
  Filled 2015-10-14 (×3): qty 1

## 2015-10-14 MED ORDER — SODIUM CHLORIDE 0.9 % IV SOLN
INTRAVENOUS | Status: DC
  Administered 2015-10-15: 04:00:00 via INTRAVENOUS

## 2015-10-14 MED ORDER — ENOXAPARIN SODIUM 40 MG/0.4ML ~~LOC~~ SOLN
40.0000 mg | SUBCUTANEOUS | Status: DC
  Administered 2015-10-14 – 2015-10-16 (×3): 40 mg via SUBCUTANEOUS
  Filled 2015-10-14 (×3): qty 0.4

## 2015-10-14 MED ORDER — ACETAMINOPHEN 500 MG PO TABS
1000.0000 mg | ORAL_TABLET | Freq: Four times a day (QID) | ORAL | Status: DC | PRN
  Administered 2015-10-14 – 2015-10-15 (×2): 1000 mg via ORAL
  Filled 2015-10-14 (×3): qty 2

## 2015-10-14 MED ORDER — DEXTROSE 5 % IV SOLN
2.0000 g | INTRAVENOUS | Status: DC
  Administered 2015-10-14: 2 g via INTRAVENOUS
  Filled 2015-10-14 (×2): qty 2

## 2015-10-14 NOTE — Progress Notes (Signed)
Progress Note    Marvin Jones.  [id number] DOB: Jan 29, 1936  DOA: 10/13/2015 PCP: Kathlene November, MD    Brief Narrative:   Marvin Jones. is an 81 y.o. male the PMH of BPH status post recent placement of Foley secondary to urinary retention who was admitted 10/13/15 with a chief complaint of fever, weakness, and altered mental status that began earlier on the date of admission after having his Foley catheter removed.  Assessment/Plan:   Principal Problem:   Sepsis secondary to Catheter-associated urinary tract infection (HCC)/BPH with urinary retention Received 3 L IV fluid bolus in the ED. Follow-up urine and blood cultures. Continue Rocephin. Repeat lactic acid (elevated on admission). Continue Flomax.  Active Problems:  Acute kidney injury Baseline creatinine 1.0. Current creatinine 1.2. Continue to hydrate.   Right middle lobe lung nodule Recommend CT for further characterization when medically stable.   Family Communication/Anticipated D/C date and plan/Code Status   DVT prophylaxis: Lovenox ordered. Code Status: Full Code.  Family Communication: Wife updated at the bedside. Disposition Plan: Home in 24-48 hours pending culture results and ability to transition to oral antibiotics.   Medical Consultants:    None.   Procedures:    None  Anti-Infectives:   Rocephin 10/14/15--->   Subjective:    Marvin Jones. continues to be spiking fevers and feels better but not back to his baseline. Poor appetite.  Objective:    Vitals:   10/14/15 0253 10/14/15 0343 10/14/15 0616 10/14/15 1440  BP:  (!) 163/71 (!) 101/40 (!) 91/52  Pulse:  98 82 73  Resp:  17 18 18   Temp: 100.5 F (38.1 C) (!) 103.2 F (39.6 C) 99 F (37.2 C) 98 F (36.7 C)  TempSrc: Oral Oral Oral Oral  SpO2:  93% 97% 97%  Weight:      Height:        Intake/Output Summary (Last 24 hours) at 10/14/15 1628 Last data filed at 10/14/15 0437  Gross per 24 hour  Intake                 0 ml  Output              750 ml  Net             -750 ml   Filed Weights   10/14/15 0044  Weight: 81.6 kg (180 lb)    Exam: General exam: Appears calm and comfortable.  Respiratory system: Clear to auscultation. Respiratory effort normal. Cardiovascular system: S1 & S2 heard, RRR. No JVD,  rubs, gallops or clicks. No murmurs. Gastrointestinal system: Abdomen is nondistended, soft and nontender. No organomegaly or masses felt. Normal bowel sounds heard. Central nervous system: Alert and oriented. No focal neurological deficits. Extremities: No clubbing,  or cyanosis. No edema. Skin: No rashes, lesions or ulcers. Psychiatry: Judgement and insight appear normal. Mood & affect appropriate.   Data Reviewed:   I have personally reviewed following labs and imaging studies:  Labs: Basic Metabolic Panel:  Recent Labs Lab 10/13/15 2345  NA 134*  K 3.8  CL 104  CO2 23  GLUCOSE 106*  BUN 18  CREATININE 1.28*  CALCIUM 9.2   GFR Estimated Creatinine Clearance: 48.3 mL/min (by C-G formula based on SCr of 1.28 mg/dL). Liver Function Tests:  Recent Labs Lab 10/13/15 2345  AST 35  ALT 26  ALKPHOS 53  BILITOT 1.5*  PROT 7.5  ALBUMIN 4.2    CBC:  Recent Labs Lab 10/13/15 2345  WBC 19.3*  NEUTROABS 17.8*  HGB 15.4  HCT 44.6  MCV 88.3  PLT 198   Sepsis Labs:  Recent Labs Lab 10/13/15 2345 10/13/15 2353 10/14/15 1101  WBC 19.3*  --   --   LATICACIDVEN  --  2.45* 3.2*    Microbiology No results found for this or any previous visit (from the past 240 hour(s)).  Radiology: Dg Chest 2 View  Result Date: 10/14/2015 CLINICAL DATA:  80 y/o M; patient had a catheter removed today with onset of fever and weakness several hours ago. Patient believes to have a urinary tract infection. EXAM: CHEST  2 VIEW COMPARISON:  None. FINDINGS: Normal cardiomediastinal silhouette given projection and technique. No consolidation, pneumothorax, or pleural effusion. No acute  osseous abnormality is evident. 10 mm density projects over the right mid lung zone of uncertain significance. IMPRESSION: 1. No active cardiopulmonary disease. 2. 10 mm nonspecific nodular density projects over the right mid lung zone, chest CT is recommended on a nonemergent basis to characterize. Electronically Signed   By: Kristine Garbe M.D.   On: 10/14/2015 01:22    Medications:   . aspirin  81 mg Oral Daily  . cefTRIAXone (ROCEPHIN)  IV  1 g Intravenous Q24H  . enoxaparin (LOVENOX) injection  40 mg Subcutaneous Q24H  . tamsulosin  0.4 mg Oral Daily   Continuous Infusions:   Time spent: 30 minutes.   LOS: 0 days   Beauty Pless  [facility] Pager [phone]. If unable to reach me by pager, please call my cell phone at [phone].  *Please refer to [email or web address], password [email or web address] to get updated schedule on who will round on this patient, as hospitalists switch teams weekly. If 7PM-7AM, please contact night-coverage at [email or web address], password [email or web address] for any overnight needs.  10/14/2015, 4:28 PM

## 2015-10-14 NOTE — Progress Notes (Signed)
RN received call from Lab with Lactic acid of 3.2 , MD notified, new order to give 545ml NS bolus. Will continue to monitor.

## 2015-10-14 NOTE — H&P (Signed)
History and Physical    Marvin Jones. [id number] DOB: January 23, 1936 DOA: 10/13/2015   PCP: Kathlene November, MD Chief Complaint:  Chief Complaint  Patient presents with  . Fever  . Weakness    HPI: Marvin Jones. is a 80 y.o. male with medical history significant of BPH, recently had foley put in place by '[facility]' (presumably [facility]).  Presents to ED with fever, weakness, AMS, Tm 100.5 in ED.  Symptoms onset earlier today after having his foley removed.  Also associated with dysuria (though he isnt sure if this is from foley removal).  Nothing makes symptoms better or worse.  Symptoms are persistent.  ED Course: Has UTI, WBC 19.3k, Tm 100.5.  Review of Systems: As per HPI otherwise 10 point review of systems negative.    Past Medical History:  Diagnosis Date  . Anxiety and depression    h/o  . Barrett's esophagus   . Diaphragmatic hernia without mention of obstruction or gangrene   . Diverticulosis of colon (without mention of hemorrhage)   . Esophageal reflux   . Hyperlipidemia   . Hypertrophy of prostate with urinary obstruction and other lower urinary tract symptoms (LUTS)   . Irritable bowel syndrome   . Personal history of colonic polyps   . Skin cancer    sees Dr Ronnald Ramp   . Stricture and stenosis of esophagus     Past Surgical History:  Procedure Laterality Date  . TONSILLECTOMY       reports that he has quit smoking. His smoking use included Cigars. He has quit using smokeless tobacco. He reports that he drinks alcohol. He reports that he does not use drugs.  No Known Allergies  Family History  Problem Relation Age of Onset  . Heart attack Father 84  . Diabetes Son   . Colon cancer Neg Hx   . Prostate cancer Neg Hx   . Hypertension Neg Hx       Prior to Admission medications   Medication Sig Start Date End Date Taking? Authorizing Provider  acetaminophen (TYLENOL) 500 MG tablet Take 1,000 mg by  mouth every 6 (six) hours as needed for moderate pain.   Yes Historical Provider, MD  aspirin 81 MG tablet Take 81 mg by mouth daily.     Yes Historical Provider, MD  bismuth subsalicylate (PEPTO BISMOL) 262 MG/15ML suspension Take 30 mLs by mouth every 6 (six) hours as needed for indigestion.    Yes Historical Provider, MD  tamsulosin (FLOMAX) 0.4 MG CAPS capsule Take 1 capsule by mouth daily. 10/13/15  Yes Historical Provider, MD    Physical Exam: Vitals:   10/13/15 2315 10/14/15 0044 10/14/15 0200  BP: 130/71  127/71  Pulse: 94  77  Resp: 20  16  Temp: 98.6 F (37 C)    TempSrc: Oral    SpO2: 95%  93%  Weight:  81.6 kg (180 lb)   Height:  5\' 9"  (1.753 m)       Constitutional: NAD, calm, comfortable Eyes: PERRL, lids and conjunctivae normal ENMT: Mucous membranes are moist. Posterior pharynx clear of any exudate or lesions.Normal dentition.  Neck: normal, supple, no masses, no thyromegaly Respiratory: clear to auscultation bilaterally, no wheezing, no crackles. Normal respiratory effort. No accessory muscle use.  Cardiovascular: Regular rate and rhythm, no murmurs / rubs / gallops. No extremity edema. 2+ pedal pulses. No carotid bruits.  Abdomen: no tenderness, no masses palpated. No  hepatosplenomegaly. Bowel sounds positive.  Musculoskeletal: no clubbing / cyanosis. No joint deformity upper and lower extremities. Good ROM, no contractures. Normal muscle tone.  Skin: no rashes, lesions, ulcers. No induration Neurologic: CN 2-12 grossly intact. Sensation intact, DTR normal. Strength 5/5 in all 4.  Psychiatric: Normal judgment and insight. Alert and oriented x 3. Normal mood.    Labs on Admission: I have personally reviewed following labs and imaging studies  CBC:  Recent Labs Lab 10/13/15 2345  WBC 19.3*  NEUTROABS 17.8*  HGB 15.4  HCT 44.6  MCV 88.3  PLT 99991111   Basic Metabolic Panel:  Recent Labs Lab 10/13/15 2345  NA 134*  K 3.8  CL 104  CO2 23  GLUCOSE 106*   BUN 18  CREATININE 1.28*  CALCIUM 9.2   GFR: Estimated Creatinine Clearance: 48.3 mL/min (by C-G formula based on SCr of 1.28 mg/dL). Liver Function Tests:  Recent Labs Lab 10/13/15 2345  AST 35  ALT 26  ALKPHOS 53  BILITOT 1.5*  PROT 7.5  ALBUMIN 4.2   No results for input(s): LIPASE, AMYLASE in the last 168 hours. No results for input(s): AMMONIA in the last 168 hours. Coagulation Profile: No results for input(s): INR, PROTIME in the last 168 hours. Cardiac Enzymes: No results for input(s): CKTOTAL, CKMB, CKMBINDEX, TROPONINI in the last 168 hours. BNP (last 3 results) No results for input(s): PROBNP in the last 8760 hours. HbA1C: No results for input(s): HGBA1C in the last 72 hours. CBG: No results for input(s): GLUCAP in the last 168 hours. Lipid Profile: No results for input(s): CHOL, HDL, LDLCALC, TRIG, CHOLHDL, LDLDIRECT in the last 72 hours. Thyroid Function Tests: No results for input(s): TSH, T4TOTAL, FREET4, T3FREE, THYROIDAB in the last 72 hours. Anemia Panel: No results for input(s): VITAMINB12, FOLATE, FERRITIN, TIBC, IRON, RETICCTPCT in the last 72 hours. Urine analysis:    Component Value Date/Time   COLORURINE YELLOW 10/13/2015 2337   APPEARANCEUR TURBID (A) 10/13/2015 2337   LABSPEC 1.013 10/13/2015 2337   PHURINE 6.5 10/13/2015 2337   GLUCOSEU NEGATIVE 10/13/2015 2337   HGBUR LARGE (A) 10/13/2015 2337   HGBUR negative 03/13/2007 0000   BILIRUBINUR NEGATIVE 10/13/2015 2337   BILIRUBINUR Negative 11/18/2014 1027   KETONESUR NEGATIVE 10/13/2015 2337   PROTEINUR 100 (A) 10/13/2015 2337   UROBILINOGEN 0.2 11/18/2014 1027   UROBILINOGEN 0.2 03/13/2007 0000   NITRITE POSITIVE (A) 10/13/2015 2337   LEUKOCYTESUR LARGE (A) 10/13/2015 2337   Sepsis Labs: @LABRCNTIP (procalcitonin:4,lacticidven:4) )No results found for this or any previous visit (from the past 240 hour(s)).   Radiological Exams on Admission: Dg Chest 2 View  Result Date:  10/14/2015 CLINICAL DATA:  80 y/o M; patient had a catheter removed today with onset of fever and weakness several hours ago. Patient believes to have a urinary tract infection. EXAM: CHEST  2 VIEW COMPARISON:  None. FINDINGS: Normal cardiomediastinal silhouette given projection and technique. No consolidation, pneumothorax, or pleural effusion. No acute osseous abnormality is evident. 10 mm density projects over the right mid lung zone of uncertain significance. IMPRESSION: 1. No active cardiopulmonary disease. 2. 10 mm nonspecific nodular density projects over the right mid lung zone, chest CT is recommended on a nonemergent basis to characterize. Electronically Signed   By: Kristine Garbe M.D.   On: 10/14/2015 01:22    EKG: Independently reviewed.  Assessment/Plan Principal Problem:   Catheter-associated urinary tract infection (HCC) Active Problems:   BPH (benign prostatic hyperplasia)   Sepsis secondary to UTI ([facility])  1. CAUTI with sepsis - 1. IVF 2. Rocephin 3. Cultures pending 2. BPH - 1. Continue flomax 2. Monitor intake and output 3. If has urinary retention may need replacement of foley.   DVT prophylaxis: Lovenox Code Status: Full Family Communication: No family in room Consults called: None Admission status: Admit to inpatient   Etta Quill DO [facility] Pager [phone] from 7PM-7AM  If 7AM-7PM, please contact the day physician for the patient [email or web address] Password [email or web address]  10/14/2015, 2:42 AM

## 2015-10-14 NOTE — Progress Notes (Signed)
PHARMACY - PHYSICIAN COMMUNICATION CRITICAL VALUE ALERT - BLOOD CULTURE IDENTIFICATION (BCID)  Results for orders placed or performed during the hospital encounter of 10/13/15  Blood Culture ID Panel (Reflexed) (Collected: 10/14/2015 12:45 AM)  Result Value Ref Range   Enterococcus species NOT DETECTED NOT DETECTED   Listeria monocytogenes NOT DETECTED NOT DETECTED   Staphylococcus species NOT DETECTED NOT DETECTED   Staphylococcus aureus NOT DETECTED NOT DETECTED   Streptococcus species NOT DETECTED NOT DETECTED   Streptococcus agalactiae NOT DETECTED NOT DETECTED   Streptococcus pneumoniae NOT DETECTED NOT DETECTED   Streptococcus pyogenes NOT DETECTED NOT DETECTED   Acinetobacter baumannii NOT DETECTED NOT DETECTED   Enterobacteriaceae species DETECTED (A) NOT DETECTED   Enterobacter cloacae complex NOT DETECTED NOT DETECTED   Escherichia coli DETECTED (A) NOT DETECTED   Klebsiella oxytoca NOT DETECTED NOT DETECTED   Klebsiella pneumoniae NOT DETECTED NOT DETECTED   Proteus species NOT DETECTED NOT DETECTED   Serratia marcescens NOT DETECTED NOT DETECTED   Carbapenem resistance NOT DETECTED NOT DETECTED   Haemophilus influenzae NOT DETECTED NOT DETECTED   Neisseria meningitidis NOT DETECTED NOT DETECTED   Pseudomonas aeruginosa NOT DETECTED NOT DETECTED   Candida albicans NOT DETECTED NOT DETECTED   Candida glabrata NOT DETECTED NOT DETECTED   Candida krusei NOT DETECTED NOT DETECTED   Candida parapsilosis NOT DETECTED NOT DETECTED   Candida tropicalis NOT DETECTED NOT DETECTED    Name of physician (or Provider) Contacted: Schorr, K  Changes to prescribed antibiotics required: Increased Rocephin to 2gm IV Q24h.  Marvin Jones 10/14/2015  7:59 PM

## 2015-10-15 DIAGNOSIS — T83511A Infection and inflammatory reaction due to indwelling urethral catheter, initial encounter: Secondary | ICD-10-CM

## 2015-10-15 DIAGNOSIS — N390 Urinary tract infection, site not specified: Secondary | ICD-10-CM

## 2015-10-15 DIAGNOSIS — N400 Enlarged prostate without lower urinary tract symptoms: Secondary | ICD-10-CM

## 2015-10-15 DIAGNOSIS — R7881 Bacteremia: Secondary | ICD-10-CM

## 2015-10-15 DIAGNOSIS — A419 Sepsis, unspecified organism: Secondary | ICD-10-CM

## 2015-10-15 LAB — CBC
HCT: 38.7 % — ABNORMAL LOW (ref 39.0–52.0)
Hemoglobin: 13.2 g/dL (ref 13.0–17.0)
MCH: 30.3 pg (ref 26.0–34.0)
MCHC: 34.1 g/dL (ref 30.0–36.0)
MCV: 88.8 fL (ref 78.0–100.0)
Platelets: 166 10*3/uL (ref 150–400)
RBC: 4.36 MIL/uL (ref 4.22–5.81)
RDW: 13.5 % (ref 11.5–15.5)
WBC: 23.5 10*3/uL — ABNORMAL HIGH (ref 4.0–10.5)

## 2015-10-15 LAB — BASIC METABOLIC PANEL
Anion gap: 5 (ref 5–15)
BUN: 21 mg/dL — ABNORMAL HIGH (ref 6–20)
CO2: 22 mmol/L (ref 22–32)
Calcium: 8.1 mg/dL — ABNORMAL LOW (ref 8.9–10.3)
Chloride: 110 mmol/L (ref 101–111)
Creatinine, Ser: 1.22 mg/dL (ref 0.61–1.24)
GFR calc Af Amer: >60 mL/min (ref >60)
GFR calc non Af Amer: 55 mL/min — ABNORMAL LOW (ref >60)
Glucose, Bld: 99 mg/dL (ref 65–99)
Potassium: 3.6 mmol/L (ref 3.5–5.1)
Sodium: 137 mmol/L (ref 135–145)

## 2015-10-15 MED ORDER — DEXTROSE 5 % IV SOLN
2.0000 g | INTRAVENOUS | Status: DC
  Administered 2015-10-15: 2 g via INTRAVENOUS
  Filled 2015-10-15 (×2): qty 2

## 2015-10-15 NOTE — Plan of Care (Signed)
Problem: Pain Managment: Goal: General experience of comfort will improve Outcome: Completed/Met Date Met: 10/15/15 Pt has denied pain and or discomfort. Intervention measures discussed should pain occur

## 2015-10-15 NOTE — Progress Notes (Signed)
PROGRESS NOTE    Marvin Jones.  [id number] DOB: 1935/12/13 DOA: 10/13/2015 PCP: Kathlene November, MD   Brief Narrative: Marvin Jones. is an 80 y.o. male the PMH of BPH status post recent placement of Foley secondary to urinary retention who was admitted 10/13/15 with a chief complaint of fever, weakness, and altered mental status that began earlier on the date of admission after having his Foley catheter removed.   Assessment & Plan:   Principal Problem:   Catheter-associated urinary tract infection (HCC) Active Problems:   BPH (benign prostatic hyperplasia)   Sepsis secondary to UTI ([place])   Sepsis Catheter-associated urinary tract infection secondary to E. Coli Bacteremia secondary to E. Coli BPH with urinary retention Blood culture significant for E. Coli. Sensitivities pending -continue Ceftriaxone 2g daily -continue flomax  Acute kidney injury Stable    DVT prophylaxis: Lovenox subq Code Status: Full code Family Communication: Wife at bedside Disposition Plan: Likely discharge home tomorrow\   Consultants:   None  Procedures:  None  Antimicrobials:  None    Subjective: Patient reports feeling a lot better today. No concerns.  Objective: Vitals:   10/14/15 1440 10/14/15 2237 10/15/15 0356 10/15/15 1555  BP: (!) 91/52 133/60 (!) 155/60 (!) 166/84  Pulse: 73 68 70 68  Resp: 18 18 18 20   Temp: 98 F (36.7 C) 98.2 F (36.8 C) 98.4 F (36.9 C) 98.5 F (36.9 C)  TempSrc: Oral Oral Oral Oral  SpO2: 97% 97% 95% 97%  Weight:      Height:        Intake/Output Summary (Last 24 hours) at 10/15/15 2106 Last data filed at 10/15/15 0300  Gross per 24 hour  Intake              830 ml  Output              250 ml  Net              580 ml   Filed Weights   10/14/15 0044  Weight: 81.6 kg (180 lb)    Examination:  General exam: Appears calm and comfortable Respiratory system: Clear to auscultation. Respiratory effort normal. Cardiovascular  system: S1 & S2 heard, RRR. No JVD, murmurs, rubs, gallops or clicks. No pedal edema. Gastrointestinal system: Abdomen is nondistended, soft and nontender. No organomegaly or masses felt. Normal bowel sounds heard. Central nervous system: Alert and oriented. No focal neurological deficits. Extremities: Symmetric 5 x 5 power. Skin: No rashes, lesions or ulcers Psychiatry: Judgement and insight appear normal. Mood & affect appropriate.     Data Reviewed: I have personally reviewed following labs and imaging studies  CBC:  Recent Labs Lab 10/13/15 2345 10/15/15 0532  WBC 19.3* 23.5*  NEUTROABS 17.8*  --   HGB 15.4 13.2  HCT 44.6 38.7*  MCV 88.3 88.8  PLT 198 [id number]   Basic Metabolic Panel:  Recent Labs Lab 10/13/15 2345 10/15/15 0532  NA 134* 137  K 3.8 3.6  CL 104 110  CO2 23 22  GLUCOSE 106* 99  BUN 18 21*  CREATININE 1.28* 1.22  CALCIUM 9.2 8.1*   GFR: Estimated Creatinine Clearance: 50.7 mL/min (by C-G formula based on SCr of 1.22 mg/dL). Liver Function Tests:  Recent Labs Lab 10/13/15 2345  AST 35  ALT 26  ALKPHOS 53  BILITOT 1.5*  PROT 7.5  ALBUMIN 4.2   No results for input(s): LIPASE, AMYLASE in the last 168 hours. No results for input(s):  AMMONIA in the last 168 hours. Coagulation Profile: No results for input(s): INR, PROTIME in the last 168 hours. Cardiac Enzymes: No results for input(s): CKTOTAL, CKMB, CKMBINDEX, TROPONINI in the last 168 hours. BNP (last 3 results) No results for input(s): PROBNP in the last 8760 hours. HbA1C: No results for input(s): HGBA1C in the last 72 hours. CBG: No results for input(s): GLUCAP in the last 168 hours. Lipid Profile: No results for input(s): CHOL, HDL, LDLCALC, TRIG, CHOLHDL, LDLDIRECT in the last 72 hours. Thyroid Function Tests: No results for input(s): TSH, T4TOTAL, FREET4, T3FREE, THYROIDAB in the last 72 hours. Anemia Panel: No results for input(s): VITAMINB12, FOLATE, FERRITIN, TIBC, IRON,  RETICCTPCT in the last 72 hours. Sepsis Labs:  Recent Labs Lab 10/13/15 2353 10/14/15 1101 10/14/15 1902  LATICACIDVEN 2.45* 3.2* 1.3    Recent Results (from the past 240 hour(s))  Urine culture     Status: Abnormal (Preliminary result)   Collection Time: 10/13/15 11:37 PM  Result Value Ref Range Status   Specimen Description URINE, RANDOM  Final   Special Requests NONE  Final   Culture >=100,000 COLONIES/mL GRAM NEGATIVE RODS (A)  Final   Report Status PENDING  Incomplete  Blood Culture (routine x 2)     Status: Abnormal (Preliminary result)   Collection Time: 10/14/15 12:45 AM  Result Value Ref Range Status   Specimen Description BLOOD RIGHT HAND  Final   Special Requests BOTTLES DRAWN AEROBIC AND ANAEROBIC [place]  Final   Culture  Setup Time   Final    GRAM NEGATIVE RODS IN BOTH AEROBIC AND ANAEROBIC BOTTLES CRITICAL RESULT CALLED TO, READ BACK BY AND VERIFIED WITH: E WILLIAMSON 10/14/15 @ 1941 M VESTAL Performed at [place] (A)  Final   Report Status PENDING  Incomplete  Blood Culture ID Panel (Reflexed)     Status: Abnormal   Collection Time: 10/14/15 12:45 AM  Result Value Ref Range Status   Enterococcus species NOT DETECTED NOT DETECTED Corrected   Listeria monocytogenes NOT DETECTED NOT DETECTED Corrected   Staphylococcus species NOT DETECTED NOT DETECTED Corrected   Staphylococcus aureus NOT DETECTED NOT DETECTED Corrected   Streptococcus species NOT DETECTED NOT DETECTED Corrected   Streptococcus agalactiae NOT DETECTED NOT DETECTED Corrected   Streptococcus pneumoniae NOT DETECTED NOT DETECTED Corrected   Streptococcus pyogenes NOT DETECTED NOT DETECTED Corrected   Acinetobacter baumannii NOT DETECTED NOT DETECTED Corrected   Enterobacteriaceae species DETECTED (A) NOT DETECTED Corrected    Comment: CRITICAL RESULT CALLED TO, READ BACK BY AND VERIFIED WITH: E WILLIAMSON 10/14/15 @ 1941 M VESTAL    Enterobacter cloacae  complex NOT DETECTED NOT DETECTED Corrected   Escherichia coli DETECTED (A) NOT DETECTED Corrected    Comment: CRITICAL RESULT CALLED TO, READ BACK BY AND VERIFIED WITH: E WILLIAMSON 10/14/15 @ 1941 M VESTAL    Klebsiella oxytoca NOT DETECTED NOT DETECTED Corrected   Klebsiella pneumoniae NOT DETECTED NOT DETECTED Corrected   Proteus species NOT DETECTED NOT DETECTED Corrected   Serratia marcescens NOT DETECTED NOT DETECTED Corrected   Carbapenem resistance NOT DETECTED NOT DETECTED Corrected   Haemophilus influenzae NOT DETECTED NOT DETECTED Corrected   Neisseria meningitidis NOT DETECTED NOT DETECTED Corrected   Pseudomonas aeruginosa NOT DETECTED NOT DETECTED Corrected   Candida albicans NOT DETECTED NOT DETECTED Corrected   Candida glabrata NOT DETECTED NOT DETECTED Corrected   Candida krusei NOT DETECTED NOT DETECTED Corrected   Candida parapsilosis NOT DETECTED NOT DETECTED Corrected  Candida tropicalis NOT DETECTED NOT DETECTED Corrected    Comment: Performed at [facility]  Blood Culture (routine x 2)     Status: None (Preliminary result)   Collection Time: 10/14/15 12:55 AM  Result Value Ref Range Status   Specimen Description BLOOD RIGHT WRIST  Final   Special Requests BOTTLES DRAWN AEROBIC AND ANAEROBIC 5CC  Final   Culture  Setup Time   Final    GRAM NEGATIVE RODS IN BOTH AEROBIC AND ANAEROBIC BOTTLES CRITICAL RESULT CALLED TO, READ BACK BY AND VERIFIED WITH: E WILLIAMSON 10/14/15 @ 1941 M VESTAL Performed at [place]  Final   Report Status PENDING  Incomplete         Radiology Studies: Dg Chest 2 View  Result Date: 10/14/2015 CLINICAL DATA:  80 y/o M; patient had a catheter removed today with onset of fever and weakness several hours ago. Patient believes to have a urinary tract infection. EXAM: CHEST  2 VIEW COMPARISON:  None. FINDINGS: Normal cardiomediastinal silhouette given projection and technique. No  consolidation, pneumothorax, or pleural effusion. No acute osseous abnormality is evident. 10 mm density projects over the right mid lung zone of uncertain significance. IMPRESSION: 1. No active cardiopulmonary disease. 2. 10 mm nonspecific nodular density projects over the right mid lung zone, chest CT is recommended on a nonemergent basis to characterize. Electronically Signed   By: Kristine Garbe M.D.   On: 10/14/2015 01:22        Scheduled Meds: . aspirin  81 mg Oral Daily  . cefTRIAXone (ROCEPHIN)  IV  2 g Intravenous Q24H  . enoxaparin (LOVENOX) injection  40 mg Subcutaneous Q24H  . tamsulosin  0.4 mg Oral Daily   Continuous Infusions:    LOS: 1 day     Cordelia Poche, MD [facility] 10/15/2015, 9:06 PM Pager: [phone]  If 7PM-7AM, please contact night-coverage [email or web address] Password [facility] 10/15/2015, 9:06 PM

## 2015-10-15 NOTE — Plan of Care (Signed)
Problem: Activity: Goal: Risk for activity intolerance will decrease Outcome: Completed/Met Date Met: 10/15/15 Pt is out of bed without assist. Gait has remained steady. Safety plan discussed. Pt is agreeable   

## 2015-10-15 NOTE — Plan of Care (Signed)
Problem: Safety: Goal: Ability to remain free from injury will improve Outcome: Completed/Met Date Met: 10/15/15  Safety plan discussed. Pt is agreeable

## 2015-10-16 DIAGNOSIS — N12 Tubulo-interstitial nephritis, not specified as acute or chronic: Secondary | ICD-10-CM

## 2015-10-16 DIAGNOSIS — B9620 Unspecified Escherichia coli [E. coli] as the cause of diseases classified elsewhere: Secondary | ICD-10-CM

## 2015-10-16 LAB — CBC
HCT: 38.6 % — ABNORMAL LOW (ref 39.0–52.0)
Hemoglobin: 13.3 g/dL (ref 13.0–17.0)
MCH: 30.5 pg (ref 26.0–34.0)
MCHC: 34.5 g/dL (ref 30.0–36.0)
MCV: 88.5 fL (ref 78.0–100.0)
Platelets: 169 10*3/uL (ref 150–400)
RBC: 4.36 MIL/uL (ref 4.22–5.81)
RDW: 13.2 % (ref 11.5–15.5)
WBC: 11.7 10*3/uL — ABNORMAL HIGH (ref 4.0–10.5)

## 2015-10-16 LAB — CULTURE, BLOOD (ROUTINE X 2)

## 2015-10-16 LAB — URINE CULTURE: Culture: >=100000 — AB

## 2015-10-16 MED ORDER — CIPROFLOXACIN HCL 250 MG PO TABS: 750.0000 mg | ORAL_TABLET | Freq: Two times a day (BID) | ORAL | 0 refills | Status: AC

## 2015-10-16 NOTE — Discharge Summary (Signed)
Physician Discharge Summary  Marvin Jones. UK:[id number] DOB: 05-13-1935 DOA: 10/13/2015  PCP: Kathlene November, MD  Admit date: 10/13/2015 Discharge date: 10/16/2015  Admitted From: Home Disposition:  Home  Recommendations for Outpatient Follow-up:  1. Follow up with PCP in 1-2 weeks  Home Health: No Equipment/Devices: None Discharge Condition: Stable CODE STATUS: Full Code Diet recommendation: Heart Healthy  Brief/Interim Summary: Marvin Jones.is an 80 y.o.malethe PMH of BPH status post recent placement of Foley secondary to urinary retention who was admitted 10/13/15 with a chief complaint of fever, weakness, and altered mental status that began earlier on the date of admission after having his Foley catheter removed. He was found to have sepsis likely secondary to UTI and started on ceftriaxone.  Discharge Diagnoses:  Principal Problem:   Catheter-associated urinary tract infection ([place]) Active Problems:   BPH (benign prostatic hyperplasia)   Sepsis secondary to UTI ([place])   E. coli bacteremia  Hospital Course:  Sepsis Catheter-associated urinary tract infection secondary to E. Coli Bacteremia secondary to E. Coli BPH with urinary retention Blood and urine cultures significant for E. Coli. Patient switched to Ciprofloxacin to complete a 2 week course. Flomax continued  Acute kidney injury Improved with fluids.  Discharge Instructions     Medication List    TAKE these medications   acetaminophen 500 MG tablet Commonly known as:  TYLENOL Take 1,000 mg by mouth every 6 (six) hours as needed for moderate pain.   aspirin 81 MG tablet Take 81 mg by mouth daily.   bismuth subsalicylate [id number] suspension Commonly known as:  PEPTO BISMOL Take 30 mLs by mouth every 6 (six) hours as needed for indigestion.   ciprofloxacin 250 MG tablet Commonly known as:  CIPRO Take 3 tablets (750 mg total) by mouth 2 (two) times daily.   tamsulosin 0.4 MG Caps  capsule Commonly known as:  FLOMAX Take 1 capsule by mouth daily.      [name], MD. Schedule an appointment as soon as possible for a visit in 2 week(s).   Specialty:  Internal Medicine Contact information: [address] [place] [address] [phone]          No Known Allergies  Consultations:  None   Procedures/Studies: Dg Chest 2 View  Result Date: 10/14/2015 CLINICAL DATA:  80 y/o M; patient had a catheter removed today with onset of fever and weakness several hours ago. Patient believes to have a urinary tract infection. EXAM: CHEST  2 VIEW COMPARISON:  None. FINDINGS: Normal cardiomediastinal silhouette given projection and technique. No consolidation, pneumothorax, or pleural effusion. No acute osseous abnormality is evident. 10 mm density projects over the right mid lung zone of uncertain significance. IMPRESSION: 1. No active cardiopulmonary disease. 2. 10 mm nonspecific nodular density projects over the right mid lung zone, chest CT is recommended on a nonemergent basis to characterize. Electronically Signed   By: Marvin Jones M.D.   On: 10/14/2015 01:22     Subjective: She reports no complaints today. He feels great. Is ready to go home.  Discharge Exam: Vitals:   10/15/15 2111 10/16/15 0701  BP: (!) 151/68 (!) 161/74  Pulse: 70 65  Resp: 19 18  Temp: 98.8 F (37.1 C) 98.3 F (36.8 C)   Vitals:   10/15/15 0356 10/15/15 1555 10/15/15 2111 10/16/15 0701  BP: (!) 155/60 (!) 166/84 (!) 151/68 (!) 161/74  Pulse: 70 68 70 65  Resp: 18  20 19 18   Temp: 98.4 F (36.9 C) 98.5 F (36.9 C) 98.8 F (37.1 C) 98.3 F (36.8 C)  TempSrc: Oral Oral Oral Oral  SpO2: 95% 97% 96% 97%  Weight:      Height:        General: Pt is alert, awake, not in acute distress Cardiovascular: RRR, S1/S2 +, no rubs, no gallops Respiratory: CTA bilaterally, no wheezing, no rhonchi Abdominal: Soft, NT, ND, bowel sounds  + Extremities: no edema, no cyanosis    The results of significant diagnostics from this hospitalization (including imaging, microbiology, ancillary and laboratory) are listed below for reference.     Microbiology: Recent Results (from the past 240 hour(s))  Urine culture     Status: Abnormal   Collection Time: 10/13/15 11:37 PM  Result Value Ref Range Status   Specimen Description URINE, RANDOM  Final   Special Requests NONE  Final   Culture >=100,000 COLONIES/mL ESCHERICHIA COLI (A)  Final   Report Status 10/16/2015 FINAL  Final   Organism ID, Bacteria ESCHERICHIA COLI (A)  Final      Susceptibility   Escherichia coli - MIC*    AMPICILLIN 8 SENSITIVE Sensitive     CEFAZOLIN <=4 SENSITIVE Sensitive     CEFTRIAXONE <=1 SENSITIVE Sensitive     CIPROFLOXACIN <=0.25 SENSITIVE Sensitive     GENTAMICIN <=1 SENSITIVE Sensitive     IMIPENEM <=0.25 SENSITIVE Sensitive     NITROFURANTOIN <=16 SENSITIVE Sensitive     TRIMETH/SULFA <=20 SENSITIVE Sensitive     AMPICILLIN/SULBACTAM 4 SENSITIVE Sensitive     PIP/TAZO <=4 SENSITIVE Sensitive     Extended ESBL NEGATIVE Sensitive     * >=100,000 COLONIES/mL ESCHERICHIA COLI  Blood Culture (routine x 2)     Status: Abnormal   Collection Time: 10/14/15 12:45 AM  Result Value Ref Range Status   Specimen Description BLOOD RIGHT HAND  Final   Special Requests BOTTLES DRAWN AEROBIC AND ANAEROBIC [place]  Final   Culture  Setup Time   Final    GRAM NEGATIVE RODS IN BOTH AEROBIC AND ANAEROBIC BOTTLES CRITICAL RESULT CALLED TO, READ BACK BY AND VERIFIED WITH: E WILLIAMSON 10/14/15 @ 1941 M VESTAL Performed at [place] (A)  Final   Report Status 10/16/2015 FINAL  Final   Organism ID, Bacteria ESCHERICHIA COLI  Final      Susceptibility   Escherichia coli - MIC*    AMPICILLIN 8 SENSITIVE Sensitive     CEFAZOLIN <=4 SENSITIVE Sensitive     CEFEPIME <=1 SENSITIVE Sensitive     CEFTAZIDIME <=1 SENSITIVE  Sensitive     CEFTRIAXONE <=1 SENSITIVE Sensitive     CIPROFLOXACIN <=0.25 SENSITIVE Sensitive     GENTAMICIN <=1 SENSITIVE Sensitive     IMIPENEM <=0.25 SENSITIVE Sensitive     TRIMETH/SULFA <=20 SENSITIVE Sensitive     AMPICILLIN/SULBACTAM 4 SENSITIVE Sensitive     PIP/TAZO <=4 SENSITIVE Sensitive     Extended ESBL NEGATIVE Sensitive     * ESCHERICHIA COLI  Blood Culture ID Panel (Reflexed)     Status: Abnormal   Collection Time: 10/14/15 12:45 AM  Result Value Ref Range Status   Enterococcus species NOT DETECTED NOT DETECTED Corrected   Listeria monocytogenes NOT DETECTED NOT DETECTED Corrected   Staphylococcus species NOT DETECTED NOT DETECTED Corrected   Staphylococcus aureus NOT DETECTED NOT DETECTED Corrected   Streptococcus species NOT DETECTED NOT DETECTED Corrected   Streptococcus agalactiae NOT DETECTED NOT DETECTED  Corrected   Streptococcus pneumoniae NOT DETECTED NOT DETECTED Corrected   Streptococcus pyogenes NOT DETECTED NOT DETECTED Corrected   Acinetobacter baumannii NOT DETECTED NOT DETECTED Corrected   Enterobacteriaceae species DETECTED (A) NOT DETECTED Corrected    Comment: CRITICAL RESULT CALLED TO, READ BACK BY AND VERIFIED WITH: E WILLIAMSON 10/14/15 @ 1941 M VESTAL    Enterobacter cloacae complex NOT DETECTED NOT DETECTED Corrected   Escherichia coli DETECTED (A) NOT DETECTED Corrected    Comment: CRITICAL RESULT CALLED TO, READ BACK BY AND VERIFIED WITH: E WILLIAMSON 10/14/15 @ 1941 M VESTAL    Klebsiella oxytoca NOT DETECTED NOT DETECTED Corrected   Klebsiella pneumoniae NOT DETECTED NOT DETECTED Corrected   Proteus species NOT DETECTED NOT DETECTED Corrected   Serratia marcescens NOT DETECTED NOT DETECTED Corrected   Carbapenem resistance NOT DETECTED NOT DETECTED Corrected   Haemophilus influenzae NOT DETECTED NOT DETECTED Corrected   Neisseria meningitidis NOT DETECTED NOT DETECTED Corrected   Pseudomonas aeruginosa NOT DETECTED NOT DETECTED Corrected    Candida albicans NOT DETECTED NOT DETECTED Corrected   Candida glabrata NOT DETECTED NOT DETECTED Corrected   Candida krusei NOT DETECTED NOT DETECTED Corrected   Candida parapsilosis NOT DETECTED NOT DETECTED Corrected   Candida tropicalis NOT DETECTED NOT DETECTED Corrected    Comment: Performed at [facility]  Blood Culture (routine x 2)     Status: Abnormal   Collection Time: 10/14/15 12:55 AM  Result Value Ref Range Status   Specimen Description BLOOD RIGHT WRIST  Final   Special Requests BOTTLES DRAWN AEROBIC AND ANAEROBIC 5CC  Final   Culture  Setup Time   Final    GRAM NEGATIVE RODS IN BOTH AEROBIC AND ANAEROBIC BOTTLES CRITICAL RESULT CALLED TO, READ BACK BY AND VERIFIED WITH: E WILLIAMSON 10/14/15 @ 1941 M VESTAL    Culture (A)  Final    ESCHERICHIA COLI SUSCEPTIBILITIES PERFORMED ON PREVIOUS CULTURE WITHIN THE LAST 5 DAYS. Performed at [facility]    Report Status 10/16/2015 FINAL  Final     Labs: BNP (last 3 results) No results for input(s): BNP in the last 8760 hours. Basic Metabolic Panel:  Recent Labs Lab 10/13/15 2345 10/15/15 0532  NA 134* 137  K 3.8 3.6  CL 104 110  CO2 23 22  GLUCOSE 106* 99  BUN 18 21*  CREATININE 1.28* 1.22  CALCIUM 9.2 8.1*   Liver Function Tests:  Recent Labs Lab 10/13/15 2345  AST 35  ALT 26  ALKPHOS 53  BILITOT 1.5*  PROT 7.5  ALBUMIN 4.2   No results for input(s): LIPASE, AMYLASE in the last 168 hours. No results for input(s): AMMONIA in the last 168 hours. CBC:  Recent Labs Lab 10/13/15 2345 10/15/15 0532 10/16/15 0512  WBC 19.3* 23.5* 11.7*  NEUTROABS 17.8*  --   --   HGB 15.4 13.2 13.3  HCT 44.6 38.7* 38.6*  MCV 88.3 88.8 88.5  PLT 198 166 169   Cardiac Enzymes: No results for input(s): CKTOTAL, CKMB, CKMBINDEX, TROPONINI in the last 168 hours. BNP: Invalid input(s): POCBNP CBG: No results for input(s): GLUCAP in the last 168 hours. D-Dimer No results for input(s): DDIMER in  the last 72 hours. Hgb A1c No results for input(s): HGBA1C in the last 72 hours. Lipid Profile No results for input(s): CHOL, HDL, LDLCALC, TRIG, CHOLHDL, LDLDIRECT in the last 72 hours. Thyroid function studies No results for input(s): TSH, T4TOTAL, T3FREE, THYROIDAB in the last 72 hours.  Invalid input(s): FREET3 Anemia  work up No results for input(s): VITAMINB12, FOLATE, FERRITIN, TIBC, IRON, RETICCTPCT in the last 72 hours. Urinalysis    Component Value Date/Time   COLORURINE YELLOW 10/13/2015 2337   APPEARANCEUR TURBID (A) 10/13/2015 2337   LABSPEC 1.013 10/13/2015 2337   PHURINE 6.5 10/13/2015 2337   GLUCOSEU NEGATIVE 10/13/2015 2337   HGBUR LARGE (A) 10/13/2015 2337   HGBUR negative 03/13/2007 0000   BILIRUBINUR NEGATIVE 10/13/2015 2337   BILIRUBINUR Negative 11/18/2014 1027   KETONESUR NEGATIVE 10/13/2015 2337   PROTEINUR 100 (A) 10/13/2015 2337   UROBILINOGEN 0.2 11/18/2014 1027   UROBILINOGEN 0.2 03/13/2007 0000   NITRITE POSITIVE (A) 10/13/2015 2337   LEUKOCYTESUR LARGE (A) 10/13/2015 2337   Sepsis Labs Invalid input(s): PROCALCITONIN,  WBC,  LACTICIDVEN Microbiology Recent Results (from the past 240 hour(s))  Urine culture     Status: Abnormal   Collection Time: 10/13/15 11:37 PM  Result Value Ref Range Status   Specimen Description URINE, RANDOM  Final   Special Requests NONE  Final   Culture >=100,000 COLONIES/mL ESCHERICHIA COLI (A)  Final   Report Status 10/16/2015 FINAL  Final   Organism ID, Bacteria ESCHERICHIA COLI (A)  Final      Susceptibility   Escherichia coli - MIC*    AMPICILLIN 8 SENSITIVE Sensitive     CEFAZOLIN <=4 SENSITIVE Sensitive     CEFTRIAXONE <=1 SENSITIVE Sensitive     CIPROFLOXACIN <=0.25 SENSITIVE Sensitive     GENTAMICIN <=1 SENSITIVE Sensitive     IMIPENEM <=0.25 SENSITIVE Sensitive     NITROFURANTOIN <=16 SENSITIVE Sensitive     TRIMETH/SULFA <=20 SENSITIVE Sensitive     AMPICILLIN/SULBACTAM 4 SENSITIVE Sensitive      PIP/TAZO <=4 SENSITIVE Sensitive     Extended ESBL NEGATIVE Sensitive     * >=100,000 COLONIES/mL ESCHERICHIA COLI  Blood Culture (routine x 2)     Status: Abnormal   Collection Time: 10/14/15 12:45 AM  Result Value Ref Range Status   Specimen Description BLOOD RIGHT HAND  Final   Special Requests BOTTLES DRAWN AEROBIC AND ANAEROBIC [place]  Final   Culture  Setup Time   Final    GRAM NEGATIVE RODS IN BOTH AEROBIC AND ANAEROBIC BOTTLES CRITICAL RESULT CALLED TO, READ BACK BY AND VERIFIED WITH: E WILLIAMSON 10/14/15 @ 1941 M VESTAL Performed at [place] (A)  Final   Report Status 10/16/2015 FINAL  Final   Organism ID, Bacteria ESCHERICHIA COLI  Final      Susceptibility   Escherichia coli - MIC*    AMPICILLIN 8 SENSITIVE Sensitive     CEFAZOLIN <=4 SENSITIVE Sensitive     CEFEPIME <=1 SENSITIVE Sensitive     CEFTAZIDIME <=1 SENSITIVE Sensitive     CEFTRIAXONE <=1 SENSITIVE Sensitive     CIPROFLOXACIN <=0.25 SENSITIVE Sensitive     GENTAMICIN <=1 SENSITIVE Sensitive     IMIPENEM <=0.25 SENSITIVE Sensitive     TRIMETH/SULFA <=20 SENSITIVE Sensitive     AMPICILLIN/SULBACTAM 4 SENSITIVE Sensitive     PIP/TAZO <=4 SENSITIVE Sensitive     Extended ESBL NEGATIVE Sensitive     * ESCHERICHIA COLI  Blood Culture ID Panel (Reflexed)     Status: Abnormal   Collection Time: 10/14/15 12:45 AM  Result Value Ref Range Status   Enterococcus species NOT DETECTED NOT DETECTED Corrected   Listeria monocytogenes NOT DETECTED NOT DETECTED Corrected   Staphylococcus species NOT DETECTED NOT DETECTED Corrected   Staphylococcus aureus NOT DETECTED NOT  DETECTED Corrected   Streptococcus species NOT DETECTED NOT DETECTED Corrected   Streptococcus agalactiae NOT DETECTED NOT DETECTED Corrected   Streptococcus pneumoniae NOT DETECTED NOT DETECTED Corrected   Streptococcus pyogenes NOT DETECTED NOT DETECTED Corrected   Acinetobacter baumannii NOT DETECTED NOT DETECTED  Corrected   Enterobacteriaceae species DETECTED (A) NOT DETECTED Corrected    Comment: CRITICAL RESULT CALLED TO, READ BACK BY AND VERIFIED WITH: E WILLIAMSON 10/14/15 @ 1941 M VESTAL    Enterobacter cloacae complex NOT DETECTED NOT DETECTED Corrected   Escherichia coli DETECTED (A) NOT DETECTED Corrected    Comment: CRITICAL RESULT CALLED TO, READ BACK BY AND VERIFIED WITH: E WILLIAMSON 10/14/15 @ 1941 M VESTAL    Klebsiella oxytoca NOT DETECTED NOT DETECTED Corrected   Klebsiella pneumoniae NOT DETECTED NOT DETECTED Corrected   Proteus species NOT DETECTED NOT DETECTED Corrected   Serratia marcescens NOT DETECTED NOT DETECTED Corrected   Carbapenem resistance NOT DETECTED NOT DETECTED Corrected   Haemophilus influenzae NOT DETECTED NOT DETECTED Corrected   Neisseria meningitidis NOT DETECTED NOT DETECTED Corrected   Pseudomonas aeruginosa NOT DETECTED NOT DETECTED Corrected   Candida albicans NOT DETECTED NOT DETECTED Corrected   Candida glabrata NOT DETECTED NOT DETECTED Corrected   Candida krusei NOT DETECTED NOT DETECTED Corrected   Candida parapsilosis NOT DETECTED NOT DETECTED Corrected   Candida tropicalis NOT DETECTED NOT DETECTED Corrected    Comment: Performed at [facility]  Blood Culture (routine x 2)     Status: Abnormal   Collection Time: 10/14/15 12:55 AM  Result Value Ref Range Status   Specimen Description BLOOD RIGHT WRIST  Final   Special Requests BOTTLES DRAWN AEROBIC AND ANAEROBIC 5CC  Final   Culture  Setup Time   Final    GRAM NEGATIVE RODS IN BOTH AEROBIC AND ANAEROBIC BOTTLES CRITICAL RESULT CALLED TO, READ BACK BY AND VERIFIED WITH: E WILLIAMSON 10/14/15 @ 1941 M VESTAL    Culture (A)  Final    ESCHERICHIA COLI SUSCEPTIBILITIES PERFORMED ON PREVIOUS CULTURE WITHIN THE LAST 5 DAYS. Performed at [facility]    Report Status 10/16/2015 FINAL  Final     Time coordinating discharge: Over 30 minutes  SIGNED:   Cordelia Poche, MD  [facility] 10/16/2015, 12:45 PM Pager [phone]  If 7PM-7AM, please contact night-coverage [email or web address] Password [email or web address]

## 2015-10-16 NOTE — Progress Notes (Signed)
Pt VSS. Pt ambulating in hallways. No fever. IVs removed. AVS reviewed at bedside w/ wife present. New prescription called into pharmacy. No further questions from pt/family. Kizzie Ide, RN

## 2015-10-16 NOTE — Discharge Instructions (Signed)
Marvin Jones, your found to have an ear infection a few urine and in your blood. The bacteria that was found is called Escherichia coli. This is likely related to your recent catheter usage. While you're here, he started on an antibiotic called ceftriaxone. During the hospitalization, you developed no recurrent fevers and overall symptoms improved. We were able to get culture sensitivities which allowed me to decide on giving you ciprofloxacin to continue treatment as an outpatient. You will need to be treated for a total of 2 weeks, which means he will be taking the ciprofloxacin for 12 days. If you develop fevers, worsening symptoms of feeling bad, nausea and vomiting that continues to worsen, severe headache, or sensitivity to light, please seek medical attention as this would be concerning for possible meningitis. Was a pleasure taking care of you and I enjoyed the company of you and your wife. Please enjoy your vacation.

## 2015-10-17 DIAGNOSIS — R7881 Bacteremia: Secondary | ICD-10-CM

## 2015-10-17 DIAGNOSIS — B9620 Unspecified Escherichia coli [E. coli] as the cause of diseases classified elsewhere: Secondary | ICD-10-CM

## 2015-10-18 ENCOUNTER — Telehealth: Payer: Self-pay

## 2015-10-18 NOTE — Telephone Encounter (Signed)
Yes, thx

## 2015-10-18 NOTE — Telephone Encounter (Signed)
Patient out of town for the next 2 weeks. Will you reach out to schedule 30 minute hospital f/u upon return please?

## 2015-10-18 NOTE — Telephone Encounter (Signed)
Transition Care Management Follow-up Telephone Call   Per discharge summary   PCP: Kathlene November, MD  Admit date: 10/13/2015 Discharge date: 10/16/2015  Admitted From: Home Disposition:  Home  Recommendations for Outpatient Follow-up:  2. Follow up with PCP in 1-2 weeks  Home Health: No Equipment/Devices: None Discharge Condition: Stable CODE STATUS: Full Code Diet recommendation: Heart Healthy  ----   How have you been since you were released from the hospital? "I think I'm gonna be alright"   Do you understand why you were in the hospital? yes   Do you understand the discharge instructions? yes   Where were you discharged to? Home   Items Reviewed:  Medications reviewed: yes  Allergies reviewed: yes  Dietary changes reviewed: no, Heart healthy  Referrals reviewed: no   Functional Questionnaire:   Activities of Daily Living (ADLs):   He states they are independent in the following: ambulation, bathing and hygiene, feeding, continence, grooming, toileting and dressing States they require assistance with the following: None   Any transportation issues/concerns?: no   Any patient concerns? yes, Patient states he is constipated, last BM 2 days ago. Patient plans to take OTC laxative and will call if no improvement.    Confirmed importance and date/time of follow-up visits scheduled no, Patient out of town for 2 weeks, would like to schedule appointment upon return.    Confirmed with patient if condition begins to worsen call PCP or go to the ER.  Patient was given the office number and encouraged to call back with question or concerns.  : yes

## 2015-10-20 NOTE — Telephone Encounter (Signed)
Called pt. Left voicemail on cell informing him to schedule an Hospital F/U with PCP. Pt's home # isn't working.

## 2015-10-22 ENCOUNTER — Telehealth: Payer: Self-pay | Admitting: Internal Medicine

## 2015-10-22 NOTE — Telephone Encounter (Signed)
Okay with me 

## 2015-10-22 NOTE — Telephone Encounter (Signed)
Yes thanks, im fine with transfer. In fact I would also be willing to see him on the 13th of this month at 41 30 (leave 11 15 SDA still). This would be for hospital follow up. We would determine if we needed more visits at that point to review the rest of his history.

## 2015-10-22 NOTE — Telephone Encounter (Signed)
Please advise 

## 2015-10-22 NOTE — Telephone Encounter (Signed)
Pt would like to switch to dr Retail banker. Pt is aware dr hunter in to 2018 transfer pt. Pt would like to switch due to [place] is 5 min away instead of [place] 30 min away.

## 2015-10-26 NOTE — Telephone Encounter (Signed)
Pt will callback to sch. Pt is unable on 11-03-15

## 2015-12-22 ENCOUNTER — Encounter: Payer: Self-pay | Admitting: Internal Medicine

## 2015-12-22 NOTE — Telephone Encounter (Signed)
Error

## 2015-12-27 DIAGNOSIS — L820 Inflamed seborrheic keratosis: Secondary | ICD-10-CM | POA: Diagnosis not present

## 2015-12-27 DIAGNOSIS — C44612 Basal cell carcinoma of skin of right upper limb, including shoulder: Secondary | ICD-10-CM | POA: Diagnosis not present

## 2015-12-27 DIAGNOSIS — Z85828 Personal history of other malignant neoplasm of skin: Secondary | ICD-10-CM | POA: Diagnosis not present

## 2015-12-27 DIAGNOSIS — L821 Other seborrheic keratosis: Secondary | ICD-10-CM | POA: Diagnosis not present

## 2015-12-27 DIAGNOSIS — L570 Actinic keratosis: Secondary | ICD-10-CM | POA: Diagnosis not present

## 2015-12-27 DIAGNOSIS — D485 Neoplasm of uncertain behavior of skin: Secondary | ICD-10-CM | POA: Diagnosis not present

## 2015-12-30 DIAGNOSIS — Z0001 Encounter for general adult medical examination with abnormal findings: Secondary | ICD-10-CM | POA: Diagnosis not present

## 2016-01-03 DIAGNOSIS — K5730 Diverticulosis of large intestine without perforation or abscess without bleeding: Secondary | ICD-10-CM | POA: Diagnosis not present

## 2016-01-03 DIAGNOSIS — Z0001 Encounter for general adult medical examination with abnormal findings: Secondary | ICD-10-CM | POA: Diagnosis not present

## 2016-01-03 DIAGNOSIS — Z1212 Encounter for screening for malignant neoplasm of rectum: Secondary | ICD-10-CM | POA: Diagnosis not present

## 2016-01-03 DIAGNOSIS — E785 Hyperlipidemia, unspecified: Secondary | ICD-10-CM | POA: Diagnosis not present

## 2016-01-03 DIAGNOSIS — Z125 Encounter for screening for malignant neoplasm of prostate: Secondary | ICD-10-CM | POA: Diagnosis not present

## 2016-01-03 DIAGNOSIS — N138 Other obstructive and reflux uropathy: Secondary | ICD-10-CM | POA: Diagnosis not present

## 2016-01-03 DIAGNOSIS — N401 Enlarged prostate with lower urinary tract symptoms: Secondary | ICD-10-CM | POA: Diagnosis not present

## 2016-03-10 ENCOUNTER — Encounter (INDEPENDENT_AMBULATORY_CARE_PROVIDER_SITE_OTHER): Payer: Self-pay

## 2016-03-10 ENCOUNTER — Ambulatory Visit (INDEPENDENT_AMBULATORY_CARE_PROVIDER_SITE_OTHER): Payer: Medicare Other | Admitting: Physician Assistant

## 2016-03-10 ENCOUNTER — Telehealth: Payer: Self-pay | Admitting: Internal Medicine

## 2016-03-10 ENCOUNTER — Encounter: Payer: Self-pay | Admitting: Physician Assistant

## 2016-03-10 VITALS — BP 146/70 | HR 62 | Ht 69.0 in | Wt 186.0 lb

## 2016-03-10 DIAGNOSIS — R1030 Lower abdominal pain, unspecified: Secondary | ICD-10-CM | POA: Diagnosis not present

## 2016-03-10 DIAGNOSIS — K582 Mixed irritable bowel syndrome: Secondary | ICD-10-CM

## 2016-03-10 MED ORDER — DICYCLOMINE HCL 10 MG PO CAPS: ORAL_CAPSULE | ORAL | 6 refills | Status: DC

## 2016-03-10 NOTE — Patient Instructions (Addendum)
We sent a prescription to [facility]  fro Bentyl ( Dicyclomine) 10 mg.  We have provided you with samples of IB Gard. Take 1 capsule twice daily.   Call back in one week with a progress report.

## 2016-03-10 NOTE — Telephone Encounter (Signed)
Patient will come in today at 1:30 to see Nicoletta Ba PA

## 2016-03-10 NOTE — Progress Notes (Signed)
Reviewed. I agree. 'Marvin Jones" has had functional abdominal complaints forever. I am aware of his wife's situation (patient of mine). I agree with your initial assessment and plans. Thank you for seeing him

## 2016-03-10 NOTE — Progress Notes (Signed)
Subjective:    Patient ID: Marvin Jones., male    DOB: 03/09/1935, 81 y.o.   MRN: [id number]  HPI Marvin Jones is a very nice 81 year old white male known to Dr. Henrene Pastor. He has history of GERD, prior dilation of esophageal stricture, adenomatous colon polyps and IBS. He last had colonoscopy in June 2014 with one diminutive polyp removed from the transverse colon which was a tubular adenoma, exam was otherwise negative with no evidence of diverticulosis. EGD was done in June 2014 also showing erosive esophagitis there was no obvious Barrett's. Patient comes in today stating that he is been having a burning across his mid to lower abdomen over the past 8 days somewhat more prominent on the left than the right. This seems to be intermittent and he has not noted any change with by mouth intake. She denies any fever or chills, no nausea, no change in bowel habits. He says his bowels are always irregular and that has not changed. He does complain of feeling bloated. He denies any dysuria urgency or frequency. He has taken some Bentyl off and on over the past few days and says that it helps some but doesn't completely alleviate the discomfort. He has  also used some Pepto-Bismol. He admits to being stressed and worried because his wife was recently diagnosed with ovarian cancer and is undergoing chemotherapy.  Review of Systems Pertinent positive and negative review of systems were noted in the above HPI section.  All other review of systems was otherwise negative.  Outpatient Encounter Prescriptions as of 03/10/2016  Medication Sig  . acetaminophen (TYLENOL) 500 MG tablet Take 1,000 mg by mouth every 6 (six) hours as needed for moderate pain.  [name] Kitchen aspirin 81 MG tablet Take 81 mg by mouth daily.    [name] Kitchen bismuth subsalicylate (PEPTO BISMOL) 262 MG/15ML suspension Take 30 mLs by mouth every 6 (six) hours as needed for indigestion.   . tamsulosin (FLOMAX) 0.4 MG CAPS capsule Take 1 capsule by mouth daily.  [name] Kitchen  dicyclomine (BENTYL) 10 MG capsule Take 1 tab by mouth  Twice daily .   No facility-administered encounter medications on file as of 03/10/2016.    No Known Allergies Patient Active Problem List   Diagnosis Date Noted  . E. coli bacteremia 10/17/2015  . Catheter-associated urinary tract infection ([place]) 10/14/2015  . Sepsis secondary to UTI ([place]) 10/14/2015  . PCP NOTES >>>>>>>>>>>> 11/19/2014  . Anxiety and depression 03/09/2014  . Annual physical exam 03/09/2014  . Toe avulsion 08/06/2011  . BARRETTS ESOPHAGUS 03/04/2010  . IRRITABLE BOWEL SYNDROME 03/04/2010  . ESOPHAGEAL STRICTURE 12/16/2009  . HIATAL HERNIA WITH REFLUX 12/16/2009  . DIVERTICULOSIS, COLON 12/16/2009  . COLONIC POLYPS, ADENOMATOUS, HX OF 12/16/2009  . Hyperlipidemia 11/23/2005  . GERD 11/23/2005  . BPH (benign prostatic hyperplasia) 11/23/2005   Social History   Social History  . Marital status: Married    Spouse name: N/A  . Number of children: 3  . Years of education: N/A   Occupational History  . retired-- Research scientist (life sciences)     Social History Main Topics  . Smoking status: Former Smoker    Types: Cigars  . Smokeless tobacco: Former Systems developer     Comment: cigars, quit ~ 2005  . Alcohol use 0.0 oz/week     Comment: 3 drinks per week  . Drug use: No  . Sexual activity: Not on file   Other Topics Concern  . Not on file   Social History Narrative  Household-- pt and wife, marriage healthy   3 independent adult children    [name] family history includes Diabetes in his son; Heart attack (age of onset: 28) in his father.      Objective:    Vitals:   03/10/16 1336  BP: (!) 146/70  Pulse: 62    Physical Exam  well-developed elderly white male in no acute distress, very pleasant blood pressure 146/70 pulse 62, height 5 foot 9 weight 186 BMI 27.4. HEENT ;nontraumatic normocephalic EOMI PERRLA sclera anicteric, Cardiovascular; regular rate and rhythm with S1-S2 no murmur rub or gallop,  Pulmonary; clear bilaterally, Abdomen ;soft, bowel sounds are active there is no palpable mass or hepatosplenomegaly some mild tenderness across the lower abdomen left greater than right no guarding or rebound no palpable mass or hepatosplenomegaly bowel sounds are present, Rectal; exam not done, Extremities; no clubbing cyanosis or edema skin warm and dry, Neuropsych; mood and affect appropriate       Assessment & Plan:   #22 81 year old white male with history of IBS, and no evidence of diverticular disease at last colonoscopy 2014 presenting with a day history of burning type lower abdominal discomfort across his lower abdomen area this is been associated with some bloating. I suspect is have any exacerbation of IBS type symptoms, probably aggravated by stress.  #2 BPH #3 GERD #4 history of tubular adenomatous colon polyps last colonoscopy 2014 due for follow-up 2019  Plan; patient advised to take Bentyl 10 mg by mouth twice a day scheduled over the next couple of weeks rather than when necessary. Add trial of IB gard 1 by mouth twice daily We discussed CT of the abdomen and pelvis, he would like to hold off for another week or so. If symptoms are persisting he will call back and we'll need to be scheduled for CT of the abdomen and pelvis. Patient preferred not to have labs drawn today, if symptoms persist he will need labs prior to CT.   Amy Genia Harold PA-C 03/10/2016   Cc: Colon Branch, MD

## 2016-03-31 ENCOUNTER — Telehealth: Payer: Self-pay | Admitting: Physician Assistant

## 2016-03-31 NOTE — Telephone Encounter (Signed)
Do you want to change his Bentyl?

## 2016-04-03 ENCOUNTER — Other Ambulatory Visit: Payer: Self-pay

## 2016-04-03 ENCOUNTER — Telehealth: Payer: Self-pay | Admitting: Internal Medicine

## 2016-04-03 DIAGNOSIS — L304 Erythema intertrigo: Secondary | ICD-10-CM | POA: Diagnosis not present

## 2016-04-03 DIAGNOSIS — L570 Actinic keratosis: Secondary | ICD-10-CM | POA: Diagnosis not present

## 2016-04-03 DIAGNOSIS — R1030 Lower abdominal pain, unspecified: Secondary | ICD-10-CM

## 2016-04-03 MED ORDER — CILIDINIUM-CHLORDIAZEPOXIDE 2.5-5 MG PO CAPS: 1.0000 | ORAL_CAPSULE | Freq: Three times a day (TID) | ORAL | 3 refills | Status: DC

## 2016-04-03 NOTE — Telephone Encounter (Signed)
Ok... Stop bentyl and start Librax One po TID ac meals  # 90 /3 refills

## 2016-04-03 NOTE — Telephone Encounter (Signed)
Left a message. I need to confirm which pharmacy he would like the Librax called to.

## 2016-04-03 NOTE — Telephone Encounter (Signed)
Patient states [facility] on [address]

## 2016-04-03 NOTE — Telephone Encounter (Signed)
Pt calling in stating he is still having abdominal pain, per last OV note pt should be scheduled for CT of A/P if still having pain. Pt scheduled for CT of A/P at [facility] CT 04/10/16@2 :30pm. Pt to arrive there at 2:15pm, be NPO after 9:30am except for bottle 1 of contrast at 12:30pm and bottle 2 at 1:30pm. Pt to come for labs this week. Pt aware and will pick up contrast from the office.

## 2016-04-03 NOTE — Telephone Encounter (Signed)
[facility]scribed to [facility]

## 2016-04-10 ENCOUNTER — Telehealth: Payer: Self-pay | Admitting: Internal Medicine

## 2016-04-10 ENCOUNTER — Inpatient Hospital Stay: Admission: RE | Admit: 2016-04-10 | Payer: Medicare Other | Source: Ambulatory Visit

## 2016-04-10 MED ORDER — CILIDINIUM-CHLORDIAZEPOXIDE 2.5-5 MG PO CAPS: 1.0000 | ORAL_CAPSULE | Freq: Three times a day (TID) | ORAL | 3 refills | Status: DC

## 2016-04-10 NOTE — Telephone Encounter (Signed)
Pt wanted the generic of librax sent to the pharmacy. Script sent to pharmacy.

## 2016-04-12 ENCOUNTER — Telehealth: Payer: Self-pay | Admitting: Internal Medicine

## 2016-04-13 ENCOUNTER — Other Ambulatory Visit (INDEPENDENT_AMBULATORY_CARE_PROVIDER_SITE_OTHER): Payer: Medicare Other

## 2016-04-13 DIAGNOSIS — R1030 Lower abdominal pain, unspecified: Secondary | ICD-10-CM | POA: Diagnosis not present

## 2016-04-13 LAB — BUN: BUN: 22 mg/dL (ref 6–23)

## 2016-04-13 LAB — CREATININE, SERUM: Creatinine, Ser: 1.27 mg/dL (ref 0.40–1.50)

## 2016-04-13 NOTE — Telephone Encounter (Signed)
Pts CT rescheduled to 04/21/16@11am  at [facility]. Pt to arrive there at 10:45am, be NPO after midnight and drink bottle one of contrast at 9am, bottle 2 at 10am. Pt to have labs done prior to scan. Pt to pick up contrast at the office. Pt aware of all instructions and appt time.

## 2016-04-14 ENCOUNTER — Ambulatory Visit (INDEPENDENT_AMBULATORY_CARE_PROVIDER_SITE_OTHER)
Admission: RE | Admit: 2016-04-14 | Discharge: 2016-04-14 | Disposition: A | Discharge status: Home / Self Care | Payer: Medicare Other | Source: Ambulatory Visit | Attending: Physician Assistant | Admitting: Physician Assistant

## 2016-04-14 DIAGNOSIS — R197 Diarrhea, unspecified: Secondary | ICD-10-CM | POA: Diagnosis not present

## 2016-04-14 DIAGNOSIS — R109 Unspecified abdominal pain: Secondary | ICD-10-CM | POA: Diagnosis not present

## 2016-04-14 DIAGNOSIS — R1030 Lower abdominal pain, unspecified: Secondary | ICD-10-CM

## 2016-04-14 MED ORDER — IOPAMIDOL (ISOVUE-300) INJECTION 61%
100.0000 mL | Freq: Once | INTRAVENOUS | Status: AC | PRN
  Administered 2016-04-14: 100 mL via INTRAVENOUS

## 2016-04-20 ENCOUNTER — Telehealth: Payer: Self-pay | Admitting: Internal Medicine

## 2016-04-20 NOTE — Telephone Encounter (Signed)
Pt calling for CT results, please advise. °

## 2016-04-21 ENCOUNTER — Inpatient Hospital Stay: Admission: RE | Admit: 2016-04-21 | Payer: Medicare Other | Source: Ambulatory Visit

## 2016-04-25 ENCOUNTER — Telehealth: Payer: Self-pay

## 2016-04-25 NOTE — Telephone Encounter (Signed)
-----   Message from Irene Shipper, MD sent at 04/24/2016  3:18 PM EST ----- Regarding: Follow-up and treatment Marvin Jones, Marvin Jones left me a message regarding his ongoing abdominal complaints. I did try to call him back but no answer. Please contact him and let him know that my recommendations are the following: 1. If he is not on, resume Zoloft 75 mg daily (can take at night) 2. Prescribe two-week course of Xifaxan 550 mg 3 times a day Convert phone note for future reference. Thank you Dr. Henrene Pastor

## 2016-04-25 NOTE — Telephone Encounter (Signed)
Left message for pt to call back  °

## 2016-04-28 MED ORDER — SERTRALINE HCL 50 MG PO TABS: 50.0000 mg | ORAL_TABLET | Freq: Every day | ORAL | 3 refills | Status: DC

## 2016-04-28 MED ORDER — SERTRALINE HCL 25 MG PO TABS: 25.0000 mg | ORAL_TABLET | Freq: Every day | ORAL | 3 refills | Status: DC

## 2016-04-28 NOTE — Telephone Encounter (Signed)
Spoke with pt and he is aware. Script sent to pharmacy for zoloft. Xifaxan [facility] to [facility] for the pt.

## 2016-05-05 DIAGNOSIS — L570 Actinic keratosis: Secondary | ICD-10-CM | POA: Diagnosis not present

## 2016-05-05 DIAGNOSIS — L821 Other seborrheic keratosis: Secondary | ICD-10-CM | POA: Diagnosis not present

## 2016-05-05 DIAGNOSIS — C44712 Basal cell carcinoma of skin of right lower limb, including hip: Secondary | ICD-10-CM | POA: Diagnosis not present

## 2016-05-05 DIAGNOSIS — D485 Neoplasm of uncertain behavior of skin: Secondary | ICD-10-CM | POA: Diagnosis not present

## 2016-05-05 DIAGNOSIS — L812 Freckles: Secondary | ICD-10-CM | POA: Diagnosis not present

## 2016-05-05 DIAGNOSIS — Z85828 Personal history of other malignant neoplasm of skin: Secondary | ICD-10-CM | POA: Diagnosis not present

## 2016-06-01 ENCOUNTER — Telehealth: Payer: Self-pay | Admitting: Internal Medicine

## 2016-06-01 NOTE — Telephone Encounter (Signed)
Pt states he is taking zoloft as prescribed by Dr. Henrene Pastor but still having some stomach discomfort. Pt states he is only taking bentyl daily. Discussed with pt that he can take it twice a day. Pt states he will try this and call back if this does not help.

## 2016-06-05 DIAGNOSIS — K580 Irritable bowel syndrome with diarrhea: Secondary | ICD-10-CM | POA: Diagnosis not present

## 2016-06-22 ENCOUNTER — Other Ambulatory Visit: Payer: Self-pay | Admitting: Internal Medicine

## 2016-06-26 ENCOUNTER — Other Ambulatory Visit: Payer: Self-pay | Admitting: Internal Medicine

## 2016-07-04 DIAGNOSIS — M159 Polyosteoarthritis, unspecified: Secondary | ICD-10-CM | POA: Diagnosis not present

## 2016-07-04 DIAGNOSIS — Z7982 Long term (current) use of aspirin: Secondary | ICD-10-CM | POA: Diagnosis not present

## 2016-07-04 DIAGNOSIS — R5383 Other fatigue: Secondary | ICD-10-CM | POA: Diagnosis not present

## 2016-07-04 DIAGNOSIS — K580 Irritable bowel syndrome with diarrhea: Secondary | ICD-10-CM | POA: Diagnosis not present

## 2016-07-15 DIAGNOSIS — J00 Acute nasopharyngitis [common cold]: Secondary | ICD-10-CM | POA: Diagnosis not present

## 2016-07-15 DIAGNOSIS — R05 Cough: Secondary | ICD-10-CM | POA: Diagnosis not present

## 2016-08-04 DIAGNOSIS — L570 Actinic keratosis: Secondary | ICD-10-CM | POA: Diagnosis not present

## 2016-08-04 DIAGNOSIS — L812 Freckles: Secondary | ICD-10-CM | POA: Diagnosis not present

## 2016-08-04 DIAGNOSIS — Z85828 Personal history of other malignant neoplasm of skin: Secondary | ICD-10-CM | POA: Diagnosis not present

## 2016-08-04 DIAGNOSIS — L821 Other seborrheic keratosis: Secondary | ICD-10-CM | POA: Diagnosis not present

## 2016-08-16 ENCOUNTER — Other Ambulatory Visit: Payer: Self-pay

## 2016-08-16 MED ORDER — SERTRALINE HCL 25 MG PO TABS: 25.0000 mg | ORAL_TABLET | Freq: Every day | ORAL | 3 refills | Status: DC

## 2016-08-16 MED ORDER — SERTRALINE HCL 50 MG PO TABS: 50.0000 mg | ORAL_TABLET | Freq: Every day | ORAL | 3 refills | Status: DC

## 2016-09-15 DIAGNOSIS — M25561 Pain in right knee: Secondary | ICD-10-CM | POA: Diagnosis not present

## 2016-09-15 DIAGNOSIS — M5136 Other intervertebral disc degeneration, lumbar region: Secondary | ICD-10-CM | POA: Diagnosis not present

## 2016-09-15 DIAGNOSIS — M4316 Spondylolisthesis, lumbar region: Secondary | ICD-10-CM | POA: Diagnosis not present

## 2016-09-15 DIAGNOSIS — G8929 Other chronic pain: Secondary | ICD-10-CM | POA: Diagnosis not present

## 2016-09-22 DIAGNOSIS — M5136 Other intervertebral disc degeneration, lumbar region: Secondary | ICD-10-CM | POA: Diagnosis not present

## 2016-10-06 ENCOUNTER — Other Ambulatory Visit (HOSPITAL_COMMUNITY): Payer: Self-pay | Admitting: Orthopedic Surgery

## 2016-10-06 DIAGNOSIS — M4316 Spondylolisthesis, lumbar region: Secondary | ICD-10-CM | POA: Diagnosis not present

## 2016-10-06 DIAGNOSIS — M5136 Other intervertebral disc degeneration, lumbar region: Secondary | ICD-10-CM | POA: Diagnosis not present

## 2016-10-09 ENCOUNTER — Other Ambulatory Visit: Payer: Self-pay | Admitting: Orthopedic Surgery

## 2016-10-09 DIAGNOSIS — M4316 Spondylolisthesis, lumbar region: Secondary | ICD-10-CM

## 2016-10-16 DIAGNOSIS — L03032 Cellulitis of left toe: Secondary | ICD-10-CM | POA: Diagnosis not present

## 2016-10-19 ENCOUNTER — Other Ambulatory Visit: Payer: Medicare Other

## 2016-10-30 ENCOUNTER — Ambulatory Visit
Admission: RE | Admit: 2016-10-30 | Discharge: 2016-10-30 | Disposition: A | Discharge status: Home / Self Care | Payer: Medicare Other | Source: Ambulatory Visit | Attending: Orthopedic Surgery | Admitting: Orthopedic Surgery

## 2016-10-30 ENCOUNTER — Other Ambulatory Visit: Payer: Self-pay | Admitting: Orthopedic Surgery

## 2016-10-30 ENCOUNTER — Ambulatory Visit
Admission: RE | Admit: 2016-10-30 | Discharge: 2016-10-30 | Disposition: A | Discharge status: Home / Self Care | Payer: Self-pay | Source: Ambulatory Visit | Attending: Orthopedic Surgery | Admitting: Orthopedic Surgery

## 2016-10-30 DIAGNOSIS — M4316 Spondylolisthesis, lumbar region: Secondary | ICD-10-CM

## 2016-10-30 DIAGNOSIS — M4807 Spinal stenosis, lumbosacral region: Secondary | ICD-10-CM | POA: Diagnosis not present

## 2016-10-30 DIAGNOSIS — R52 Pain, unspecified: Secondary | ICD-10-CM

## 2016-10-30 MED ORDER — DIAZEPAM 5 MG PO TABS
5.0000 mg | ORAL_TABLET | Freq: Once | ORAL | Status: DC
  Administered 2016-10-30: 5 mg via ORAL

## 2016-10-30 MED ORDER — IOPAMIDOL (ISOVUE-M 200) INJECTION 41%
15.0000 mL | Freq: Once | INTRAMUSCULAR | Status: AC
  Administered 2016-10-30: 15 mL via INTRATHECAL

## 2016-10-30 NOTE — Progress Notes (Signed)
Patient states he has been off Zoloft for at least the past two days.  Brita Romp, RN

## 2016-10-30 NOTE — Discharge Instructions (Signed)
Myelogram Discharge Instructions  1. Go home and rest quietly for the next 24 hours.  It is important to lie flat for the next 24 hours.  Get up only to go to the restroom.  You may lie in the bed or on a couch on your back, your stomach, your left side or your right side.  You may have one pillow under your head.  You may have pillows between your knees while you are on your side or under your knees while you are on your back.  2. DO NOT drive today.  Recline the seat as far back as it will go, while still wearing your seat belt, on the way home.  3. You may get up to go to the bathroom as needed.  You may sit up for 10 minutes to eat.  You may resume your normal diet and medications unless otherwise indicated.  Drink lots of extra fluids today and tomorrow.  4. The incidence of headache, nausea, or vomiting is about 5% (one in 20 patients).  If you develop a headache, lie flat and drink plenty of fluids until the headache goes away.  Caffeinated beverages may be helpful.  If you develop severe nausea and vomiting or a headache that does not go away with flat bed rest, call [phone].  5. You may resume normal activities after your 24 hours of bed rest is over; however, do not exert yourself strongly or do any heavy lifting tomorrow. If when you get up you have a headache when standing, go back to bed and force fluids for another 24 hours.  6. Call your physician for a follow-up appointment.  The results of your myelogram will be sent directly to your physician by the following day.  7. If you have any questions or if complications develop after you arrive home, please call [phone].  Discharge instructions have been explained to the patient.  The patient, or the person responsible for the patient, fully understands these instructions.       May resume Zoloft on Sept. 11, 2018, after 10:30 am.

## 2016-11-03 ENCOUNTER — Other Ambulatory Visit: Payer: Medicare Other

## 2016-11-06 DIAGNOSIS — M5136 Other intervertebral disc degeneration, lumbar region: Secondary | ICD-10-CM | POA: Diagnosis not present

## 2016-11-06 DIAGNOSIS — M21372 Foot drop, left foot: Secondary | ICD-10-CM | POA: Diagnosis not present

## 2016-11-06 DIAGNOSIS — M4316 Spondylolisthesis, lumbar region: Secondary | ICD-10-CM | POA: Diagnosis not present

## 2016-11-14 DIAGNOSIS — M4316 Spondylolisthesis, lumbar region: Secondary | ICD-10-CM | POA: Diagnosis not present

## 2016-11-14 DIAGNOSIS — M5136 Other intervertebral disc degeneration, lumbar region: Secondary | ICD-10-CM | POA: Diagnosis not present

## 2016-11-14 DIAGNOSIS — M21372 Foot drop, left foot: Secondary | ICD-10-CM | POA: Diagnosis not present

## 2016-11-20 ENCOUNTER — Ambulatory Visit (INDEPENDENT_AMBULATORY_CARE_PROVIDER_SITE_OTHER): Payer: Medicare Other | Admitting: Podiatry

## 2016-11-20 ENCOUNTER — Encounter: Payer: Self-pay | Admitting: Podiatry

## 2016-11-20 VITALS — BP 164/94 | HR 70

## 2016-11-20 DIAGNOSIS — M4316 Spondylolisthesis, lumbar region: Secondary | ICD-10-CM | POA: Diagnosis not present

## 2016-11-20 DIAGNOSIS — S90212A Contusion of left great toe with damage to nail, initial encounter: Secondary | ICD-10-CM | POA: Diagnosis not present

## 2016-11-20 NOTE — Patient Instructions (Signed)
The left great toenail demonstrates dried blood and lifting off the nailbed. Today I debrided nail which should resolve the problem. Most likely the left foot drop further aggravates the problem. Return if symptoms do not improve

## 2016-11-20 NOTE — Progress Notes (Signed)
   Subjective:    Patient ID: Marvin Jones., male    DOB: 1935-03-24, 81 y.o.   MRN: [id number]  HPI This patient presents with approximate 7 week history of discomfort localized around the left hallux nail. Patient describes visiting primary care physician who per patient "cleaned it out" and prescribed oral antibiotic 7 days recommend 3 weeks ago. This treatment has reduced some of the symptoms in the area. Patient has left dropfoot most likely associated with herniated disc or compression on nerve root with pending evaluation later today.   Review of Systems     Objective:   Physical Exam  Orientated 3  Vascular: No calf edema calf tenderness bilaterally DP and PT pulses 2/4 bilaterally Capillary reflex within normal limits bilaterally  Neurological: Sensation to 10 g monofilament wire intact 5/5 right 4/5 left Vibratory sensation reactive bilaterally Ankle reflexes reactive bilaterally  Dermatological: No open skin lesions bilaterally Left hallux nail is incurvated with evidence of dried blood along the lateral margin. There is no active erythema, edema, warmth, drainage. The lateral margin is tender to direct palpation  Musculoskeletal: Manual motor testing: Dorsi flexion 5/5 bilaterally Plantar flexion 5/5 right and 1/5 left       Assessment & Plan:   Assessment: Old subungual hematoma left hallux Left dropfoot with pending further evaluation per patient  Plan: Debride the lateral aspect of the left hallux nail plate without any bleeding. There was no drainage released. Patient describes relief from the debridement. Patient returned the symptoms reoccur

## 2016-11-29 DIAGNOSIS — H26491 Other secondary cataract, right eye: Secondary | ICD-10-CM | POA: Diagnosis not present

## 2016-12-08 DIAGNOSIS — M21372 Foot drop, left foot: Secondary | ICD-10-CM | POA: Diagnosis not present

## 2016-12-12 DIAGNOSIS — H26491 Other secondary cataract, right eye: Secondary | ICD-10-CM | POA: Diagnosis not present

## 2016-12-27 DIAGNOSIS — Z23 Encounter for immunization: Secondary | ICD-10-CM | POA: Diagnosis not present

## 2017-01-01 DIAGNOSIS — L812 Freckles: Secondary | ICD-10-CM | POA: Diagnosis not present

## 2017-01-01 DIAGNOSIS — L570 Actinic keratosis: Secondary | ICD-10-CM | POA: Diagnosis not present

## 2017-01-01 DIAGNOSIS — D1801 Hemangioma of skin and subcutaneous tissue: Secondary | ICD-10-CM | POA: Diagnosis not present

## 2017-01-01 DIAGNOSIS — Z0000 Encounter for general adult medical examination without abnormal findings: Secondary | ICD-10-CM | POA: Diagnosis not present

## 2017-01-01 DIAGNOSIS — L821 Other seborrheic keratosis: Secondary | ICD-10-CM | POA: Diagnosis not present

## 2017-01-01 DIAGNOSIS — C44719 Basal cell carcinoma of skin of left lower limb, including hip: Secondary | ICD-10-CM | POA: Diagnosis not present

## 2017-01-01 DIAGNOSIS — Z85828 Personal history of other malignant neoplasm of skin: Secondary | ICD-10-CM | POA: Diagnosis not present

## 2017-01-02 DIAGNOSIS — Z0001 Encounter for general adult medical examination with abnormal findings: Secondary | ICD-10-CM | POA: Diagnosis not present

## 2017-01-02 DIAGNOSIS — Z7982 Long term (current) use of aspirin: Secondary | ICD-10-CM | POA: Diagnosis not present

## 2017-01-02 DIAGNOSIS — K2270 Barrett's esophagus without dysplasia: Secondary | ICD-10-CM | POA: Diagnosis not present

## 2017-01-02 DIAGNOSIS — M4316 Spondylolisthesis, lumbar region: Secondary | ICD-10-CM | POA: Diagnosis not present

## 2017-01-04 DIAGNOSIS — Z0001 Encounter for general adult medical examination with abnormal findings: Secondary | ICD-10-CM | POA: Diagnosis not present

## 2017-01-04 DIAGNOSIS — K589 Irritable bowel syndrome without diarrhea: Secondary | ICD-10-CM | POA: Diagnosis not present

## 2017-01-04 DIAGNOSIS — K2270 Barrett's esophagus without dysplasia: Secondary | ICD-10-CM | POA: Diagnosis not present

## 2017-01-04 DIAGNOSIS — N401 Enlarged prostate with lower urinary tract symptoms: Secondary | ICD-10-CM | POA: Diagnosis not present

## 2017-01-04 DIAGNOSIS — K5730 Diverticulosis of large intestine without perforation or abscess without bleeding: Secondary | ICD-10-CM | POA: Diagnosis not present

## 2017-04-05 DIAGNOSIS — M25562 Pain in left knee: Secondary | ICD-10-CM | POA: Diagnosis not present

## 2017-04-05 DIAGNOSIS — M25561 Pain in right knee: Secondary | ICD-10-CM | POA: Diagnosis not present

## 2017-05-04 DIAGNOSIS — Z85828 Personal history of other malignant neoplasm of skin: Secondary | ICD-10-CM | POA: Diagnosis not present

## 2017-05-04 DIAGNOSIS — D1801 Hemangioma of skin and subcutaneous tissue: Secondary | ICD-10-CM | POA: Diagnosis not present

## 2017-05-04 DIAGNOSIS — L570 Actinic keratosis: Secondary | ICD-10-CM | POA: Diagnosis not present

## 2017-05-04 DIAGNOSIS — L812 Freckles: Secondary | ICD-10-CM | POA: Diagnosis not present

## 2017-05-04 DIAGNOSIS — L821 Other seborrheic keratosis: Secondary | ICD-10-CM | POA: Diagnosis not present

## 2017-05-20 ENCOUNTER — Emergency Department (HOSPITAL_COMMUNITY): Payer: Medicare Other

## 2017-05-20 ENCOUNTER — Emergency Department (HOSPITAL_COMMUNITY)
Admission: EM | Admit: 2017-05-20 | Discharge: 2017-05-20 | Disposition: A | Discharge status: Home / Self Care | Payer: Medicare Other | Attending: Emergency Medicine | Admitting: Emergency Medicine

## 2017-05-20 DIAGNOSIS — W108XXA Fall (on) (from) other stairs and steps, initial encounter: Secondary | ICD-10-CM | POA: Diagnosis not present

## 2017-05-20 DIAGNOSIS — E785 Hyperlipidemia, unspecified: Secondary | ICD-10-CM | POA: Insufficient documentation

## 2017-05-20 DIAGNOSIS — S62101A Fracture of unspecified carpal bone, right wrist, initial encounter for closed fracture: Secondary | ICD-10-CM

## 2017-05-20 DIAGNOSIS — S6991XA Unspecified injury of right wrist, hand and finger(s), initial encounter: Secondary | ICD-10-CM | POA: Diagnosis present

## 2017-05-20 DIAGNOSIS — Y939 Activity, unspecified: Secondary | ICD-10-CM | POA: Insufficient documentation

## 2017-05-20 DIAGNOSIS — Y92009 Unspecified place in unspecified non-institutional (private) residence as the place of occurrence of the external cause: Secondary | ICD-10-CM | POA: Insufficient documentation

## 2017-05-20 DIAGNOSIS — Z87891 Personal history of nicotine dependence: Secondary | ICD-10-CM | POA: Diagnosis not present

## 2017-05-20 DIAGNOSIS — S52501A Unspecified fracture of the lower end of right radius, initial encounter for closed fracture: Secondary | ICD-10-CM | POA: Insufficient documentation

## 2017-05-20 DIAGNOSIS — M79641 Pain in right hand: Secondary | ICD-10-CM | POA: Diagnosis not present

## 2017-05-20 DIAGNOSIS — Z79899 Other long term (current) drug therapy: Secondary | ICD-10-CM | POA: Diagnosis not present

## 2017-05-20 DIAGNOSIS — Y998 Other external cause status: Secondary | ICD-10-CM | POA: Diagnosis not present

## 2017-05-20 DIAGNOSIS — M25531 Pain in right wrist: Secondary | ICD-10-CM | POA: Diagnosis not present

## 2017-05-20 DIAGNOSIS — Z7982 Long term (current) use of aspirin: Secondary | ICD-10-CM | POA: Diagnosis not present

## 2017-05-20 DIAGNOSIS — S299XXA Unspecified injury of thorax, initial encounter: Secondary | ICD-10-CM | POA: Diagnosis not present

## 2017-05-20 LAB — BASIC METABOLIC PANEL
Anion gap: 8 (ref 5–15)
BUN: 21 mg/dL — ABNORMAL HIGH (ref 6–20)
CO2: 24 mmol/L (ref 22–32)
Calcium: 8.9 mg/dL (ref 8.9–10.3)
Chloride: 107 mmol/L (ref 101–111)
Creatinine, Ser: 1.11 mg/dL (ref 0.61–1.24)
GFR calc Af Amer: >60 mL/min (ref >60)
GFR calc non Af Amer: >60 mL/min (ref >60)
Glucose, Bld: 104 mg/dL — ABNORMAL HIGH (ref 65–99)
Potassium: 3.9 mmol/L (ref 3.5–5.1)
Sodium: 139 mmol/L (ref 135–145)

## 2017-05-20 LAB — CBC
HCT: 44.4 % (ref 39.0–52.0)
Hemoglobin: 15.2 g/dL (ref 13.0–17.0)
MCH: 30.8 pg (ref 26.0–34.0)
MCHC: 34.2 g/dL (ref 30.0–36.0)
MCV: 90.1 fL (ref 78.0–100.0)
Platelets: 227 10*3/uL (ref 150–400)
RBC: 4.93 MIL/uL (ref 4.22–5.81)
RDW: 13.3 % (ref 11.5–15.5)
WBC: 9.8 10*3/uL (ref 4.0–10.5)

## 2017-05-20 NOTE — Discharge Instructions (Addendum)
Keep splint in place Elevate and use cold therapy Call Dr. Charlestine Night office tomorrow for evaluation in next 1-2 days. REcheck of bp in your doctor's office

## 2017-05-20 NOTE — ED Triage Notes (Signed)
Pt has drop foot.  Tripped on carpet yesterday and caught weight with his hand.  C/O right wrist and arm pain.

## 2017-05-20 NOTE — ED Provider Notes (Signed)
[facility] DEPT Provider Note   CSN: [id number] Arrival date & time: 05/20/17  1012     History   Chief Complaint Chief Complaint  Patient presents with  . Fall  . Wrist Pain    HPI Marvin Jones. is a 82 y.o. male.  HPI   82 year old man without significant past medical history who presents today for fall down 4 steps at home yesterday.  He states that he has a dropfoot and sometimes tripped on it.  He denies striking his head.  He states he caught himself with his hands.  He is complaining chiefly of pain in his right wrist.  He is right-handed.  He denies striking his head or having any loss of consciousness.  He denies any neck or back pain.  He is not having any new numbness, tingling, or weakness.  He has noted swelling and pain in the right wrist and hand that has some in the left hand.  He states that he tried to call Dr. Charlestine Night office yesterday but this occurred just after the clinic closed.  He presents today for further evaluation.  Past Medical History:  Diagnosis Date  . Anxiety and depression    h/o  . Barrett's esophagus   . Diaphragmatic hernia without mention of obstruction or gangrene   . Diverticulosis of colon (without mention of hemorrhage)   . Esophageal reflux   . Hyperlipidemia   . Hypertrophy of prostate with urinary obstruction and other lower urinary tract symptoms (LUTS)   . Irritable bowel syndrome   . Personal history of colonic polyps   . Skin cancer    sees Dr Ronnald Ramp   . Stricture and stenosis of esophagus     Patient Active Problem List   Diagnosis Date Noted  . E. coli bacteremia 10/17/2015  . Catheter-associated urinary tract infection ([place]) 10/14/2015  . Sepsis secondary to UTI ([place]) 10/14/2015  . PCP NOTES >>>>>>>>>>>> 11/19/2014  . Anxiety and depression 03/09/2014  . Annual physical exam 03/09/2014  . Toe avulsion 08/06/2011  . BARRETTS ESOPHAGUS 03/04/2010  . IRRITABLE BOWEL SYNDROME  03/04/2010  . ESOPHAGEAL STRICTURE 12/16/2009  . HIATAL HERNIA WITH REFLUX 12/16/2009  . DIVERTICULOSIS, COLON 12/16/2009  . COLONIC POLYPS, ADENOMATOUS, HX OF 12/16/2009  . Hyperlipidemia 11/23/2005  . GERD 11/23/2005  . BPH (benign prostatic hyperplasia) 11/23/2005    Past Surgical History:  Procedure Laterality Date  . TONSILLECTOMY          Home Medications    Prior to Admission medications   Medication Sig Start Date End Date Taking? Authorizing Provider  acetaminophen (TYLENOL) 500 MG tablet Take 1,000 mg by mouth every 6 (six) hours as needed for moderate pain.    [provider]  aspirin 81 MG tablet Take 81 mg by mouth daily.      [provider]  bismuth subsalicylate (PEPTO BISMOL) 262 MG/15ML suspension Take 30 mLs by mouth every 6 (six) hours as needed for indigestion.     [provider]  clidinium-chlordiazePOXIDE (LIBRAX) 5-2.5 MG capsule Take 1 capsule by mouth 3 (three) times daily before meals. 04/10/16   Esterwood, Amy S, PA-C  dicyclomine (BENTYL) 10 MG capsule Take 1 tab by mouth  Twice daily . 03/10/16   Esterwood, Amy S, PA-C  sertraline (ZOLOFT) 25 MG tablet Take 1 tablet (25 mg total) by mouth daily. 08/16/16   Irene Shipper, MD  tamsulosin (FLOMAX) 0.4 MG CAPS capsule Take 1 capsule by mouth  daily. 10/13/15   [provider]    Family History Family History  Problem Relation Age of Onset  . Heart attack Father 44  . Diabetes Son   . Colon cancer Neg Hx   . Prostate cancer Neg Hx   . Hypertension Neg Hx     Social History Social History   Tobacco Use  . Smoking status: Former Smoker    Types: Cigars  . Smokeless tobacco: Former Systems developer  . Tobacco comment: cigars, quit ~ 2005  Substance Use Topics  . Alcohol use: Yes    Alcohol/week: 0.0 oz    Comment: 3 drinks per week  . Drug use: No     Allergies   Patient has no known allergies.   Review of Systems Review of Systems  All other systems reviewed and  are negative.    Physical Exam Updated Vital Signs BP (!) 176/92 (BP Location: Left Arm)   Pulse 73   Temp 98.2 F (36.8 C) (Oral)   Resp 16   Ht 1.753 m (5\' 9" )   Wt 83.5 kg (184 lb)   SpO2 96%   BMI 27.17 kg/m   Physical Exam  Constitutional: He is oriented to person, place, and time. He appears well-developed and well-nourished.  HENT:  Head: Normocephalic and atraumatic.  Right Ear: External ear normal.  Left Ear: External ear normal.  Mouth/Throat: Oropharynx is clear and moist.  Eyes: Pupils are equal, round, and reactive to light. EOM are normal.  Neck: Normal range of motion. Neck supple.  Cardiovascular: Normal rate.  Pulmonary/Chest: Effort normal and breath sounds normal.  Abdominal: Soft.  Musculoskeletal:       Arms: Swelling dorsal aspect of right wrist with mild tenderness on the medial aspect.  Pulses are intact.  There is some swelling over the dorsal aspect of the hand.  Two-point sensation is intact over the fingers.  Full  range of motion of intrinsic muscles of hand, elbow is normal with no tenderness palpation and full active range of motion. Left hand has some swelling over the thenar eminence.  He has full active range of motion of the intrinsic muscles of the hand, wrist, and elbow No signs of trauma are noted on his trunk No tenderness palpation is noted over the cervical, thoracic, or lumbar spine.  He has full active range of motion of both of his hips and knees.  No signs of trauma are noted in the lower extremities or pelvis.  Neurological: He is alert and oriented to person, place, and time.  Skin: Skin is warm. Capillary refill takes less than 2 seconds.  Psychiatric: He has a normal mood and affect. His behavior is normal.  Nursing note and vitals reviewed.    ED Treatments / Results  Labs (all labs ordered are listed, but only abnormal results are displayed) Labs Reviewed  CBC  BASIC METABOLIC PANEL    EKG EKG  Interpretation  Date/Time:  Sunday May 20 2017 10:52:26 EDT Ventricular Rate:  69 PR Interval:    QRS Duration: 94 QT Interval:  398 QTC Calculation: 427 R Axis:   -55 Text Interpretation:  Sinus arrhythmia Left anterior fascicular block Abnormal R-wave progression, early transition Confirmed by Pattricia Boss [phone]) on 05/20/2017 11:22:45 AM   Radiology Dg Chest 2 View  Result Date: 05/20/2017 CLINICAL DATA:  Fall. EXAM: CHEST - 2 VIEW COMPARISON:  10/14/2015 and prior exams FINDINGS: The cardiomediastinal silhouette is unremarkable. This is a mildly low volume film. Mild chronic  peribronchial thickening again noted. There is no evidence of focal airspace disease, pulmonary edema, suspicious pulmonary nodule/mass, pleural effusion, or pneumothorax. No acute bony abnormalities are identified. IMPRESSION: No active cardiopulmonary disease. Electronically Signed   By: Margarette Canada M.D.   On: 05/20/2017 11:12   Dg Wrist Complete Right  Result Date: 05/20/2017 CLINICAL DATA:  Acute RIGHT wrist pain following fall. EXAM: RIGHT WRIST - COMPLETE 3+ VIEW COMPARISON:  None. FINDINGS: A mildly comminuted slightly impacted nondisplaced distal radial fracture is noted without definite extension to the articular surface. No subluxation or dislocation identified. Degenerative changes at the first carpometacarpal joint noted. IMPRESSION: Mildly comminuted slightly impacted nondisplaced distal radial fracture. Electronically Signed   By: Margarette Canada M.D.   On: 05/20/2017 11:10   Dg Hand Complete Right  Result Date: 05/20/2017 CLINICAL DATA:  Acute RIGHT hand pain following fall yesterday. Initial encounter. EXAM: RIGHT HAND - COMPLETE 3+ VIEW COMPARISON:  None. FINDINGS: A mildly comminuted slightly impacted nondisplaced distal radial fracture is noted. No other acute fractures noted. Degenerative changes at the first carpometacarpal joint identified. No subluxation or dislocation. IMPRESSION: Mildly  comminuted slightly impacted nondisplaced distal radial fracture. Electronically Signed   By: Margarette Canada M.D.   On: 05/20/2017 11:11    Procedures Procedures (including critical care time)  Medications Ordered in ED Medications - No data to display   Initial Impression / Assessment and Plan / ED Course  I have reviewed the triage vital signs and the nursing notes.  Pertinent labs & imaging results that were available during my care of the patient were reviewed by me and considered in my medical decision making (see chart for details).  Clinical Course as of May 21 1138  Sun May 20, 2017  1139 Reviewed x-rays   [DR]    Clinical Course User Index [DR] Pattricia Boss, MD    This is a 82 year old man who fell down steps.  He has a fracture of the right distal radius is moderately impacted with some comminution.  He is neurovascularly intact at this injury.  He will have a splint placed to be referred for follow-up He also complained of some pain and swelling in the left hand.  He refused x-rays of this area. He had EKG performed and labs obtained in anticipation of possible surgery.  EKG is normal.  CBC is reviewed and normal Final Clinical Impressions(s) / ED Diagnoses   Final diagnoses:  None   Keep splint in place Elevate and use cold therapy Call Dr. Charlestine Night office tomorrow for evaluation in next 1-2 days. REcheck of bp in your doctor's office  ED Discharge Orders    None       Pattricia Boss, MD 05/20/17 [phone]

## 2017-06-04 DIAGNOSIS — S52201A Unspecified fracture of shaft of right ulna, initial encounter for closed fracture: Secondary | ICD-10-CM | POA: Diagnosis not present

## 2017-06-04 DIAGNOSIS — M8589 Other specified disorders of bone density and structure, multiple sites: Secondary | ICD-10-CM | POA: Diagnosis not present

## 2017-06-04 DIAGNOSIS — S5291XA Unspecified fracture of right forearm, initial encounter for closed fracture: Secondary | ICD-10-CM | POA: Diagnosis not present

## 2017-06-04 DIAGNOSIS — M8580 Other specified disorders of bone density and structure, unspecified site: Secondary | ICD-10-CM | POA: Diagnosis not present

## 2017-06-16 DIAGNOSIS — S5291XA Unspecified fracture of right forearm, initial encounter for closed fracture: Secondary | ICD-10-CM | POA: Diagnosis not present

## 2017-06-22 DIAGNOSIS — R5383 Other fatigue: Secondary | ICD-10-CM | POA: Diagnosis not present

## 2017-06-25 DIAGNOSIS — E039 Hypothyroidism, unspecified: Secondary | ICD-10-CM | POA: Diagnosis not present

## 2017-06-25 DIAGNOSIS — S52521D Torus fracture of lower end of right radius, subsequent encounter for fracture with routine healing: Secondary | ICD-10-CM | POA: Diagnosis not present

## 2017-07-30 DIAGNOSIS — S52521D Torus fracture of lower end of right radius, subsequent encounter for fracture with routine healing: Secondary | ICD-10-CM | POA: Diagnosis not present

## 2017-08-02 DIAGNOSIS — E039 Hypothyroidism, unspecified: Secondary | ICD-10-CM | POA: Diagnosis not present

## 2017-08-06 DIAGNOSIS — E039 Hypothyroidism, unspecified: Secondary | ICD-10-CM | POA: Diagnosis not present

## 2017-08-16 ENCOUNTER — Other Ambulatory Visit: Payer: Self-pay | Admitting: Internal Medicine

## 2017-09-12 DIAGNOSIS — M25562 Pain in left knee: Secondary | ICD-10-CM | POA: Diagnosis not present

## 2017-09-12 DIAGNOSIS — M1712 Unilateral primary osteoarthritis, left knee: Secondary | ICD-10-CM | POA: Diagnosis not present

## 2017-09-13 DIAGNOSIS — L821 Other seborrheic keratosis: Secondary | ICD-10-CM | POA: Diagnosis not present

## 2017-09-13 DIAGNOSIS — L570 Actinic keratosis: Secondary | ICD-10-CM | POA: Diagnosis not present

## 2017-09-13 DIAGNOSIS — Z85828 Personal history of other malignant neoplasm of skin: Secondary | ICD-10-CM | POA: Diagnosis not present

## 2017-09-17 DIAGNOSIS — E039 Hypothyroidism, unspecified: Secondary | ICD-10-CM | POA: Diagnosis not present

## 2017-09-17 DIAGNOSIS — Z0001 Encounter for general adult medical examination with abnormal findings: Secondary | ICD-10-CM | POA: Diagnosis not present

## 2017-09-17 DIAGNOSIS — Z7982 Long term (current) use of aspirin: Secondary | ICD-10-CM | POA: Diagnosis not present

## 2017-09-17 DIAGNOSIS — Z125 Encounter for screening for malignant neoplasm of prostate: Secondary | ICD-10-CM | POA: Diagnosis not present

## 2017-09-19 ENCOUNTER — Other Ambulatory Visit: Payer: Self-pay | Admitting: Orthopedic Surgery

## 2017-09-19 DIAGNOSIS — M5136 Other intervertebral disc degeneration, lumbar region: Secondary | ICD-10-CM

## 2017-09-25 DIAGNOSIS — M4316 Spondylolisthesis, lumbar region: Secondary | ICD-10-CM | POA: Diagnosis not present

## 2017-09-26 ENCOUNTER — Other Ambulatory Visit: Payer: Self-pay | Admitting: Student

## 2017-09-26 DIAGNOSIS — M4316 Spondylolisthesis, lumbar region: Secondary | ICD-10-CM

## 2017-10-04 DIAGNOSIS — M25561 Pain in right knee: Secondary | ICD-10-CM | POA: Diagnosis not present

## 2017-10-04 DIAGNOSIS — M25562 Pain in left knee: Secondary | ICD-10-CM | POA: Diagnosis not present

## 2017-10-09 ENCOUNTER — Ambulatory Visit
Admission: RE | Admit: 2017-10-09 | Discharge: 2017-10-09 | Disposition: A | Discharge status: Home / Self Care | Payer: Medicare Other | Source: Ambulatory Visit | Attending: Student | Admitting: Student

## 2017-10-09 DIAGNOSIS — M545 Low back pain: Secondary | ICD-10-CM | POA: Diagnosis not present

## 2017-10-09 DIAGNOSIS — M4316 Spondylolisthesis, lumbar region: Secondary | ICD-10-CM

## 2017-10-09 MED ORDER — IOPAMIDOL (ISOVUE-M 200) INJECTION 41%
1.0000 mL | Freq: Once | INTRAMUSCULAR | Status: AC
  Administered 2017-10-09: 1 mL via EPIDURAL

## 2017-10-09 MED ORDER — METHYLPREDNISOLONE ACETATE 40 MG/ML INJ SUSP (RADIOLOG
120.0000 mg | Freq: Once | INTRAMUSCULAR | Status: AC
  Administered 2017-10-09: 120 mg via EPIDURAL

## 2017-10-09 NOTE — Discharge Instructions (Signed)

## 2017-11-14 DIAGNOSIS — M810 Age-related osteoporosis without current pathological fracture: Secondary | ICD-10-CM | POA: Diagnosis not present

## 2017-12-03 DIAGNOSIS — Z85828 Personal history of other malignant neoplasm of skin: Secondary | ICD-10-CM | POA: Diagnosis not present

## 2017-12-03 DIAGNOSIS — L821 Other seborrheic keratosis: Secondary | ICD-10-CM | POA: Diagnosis not present

## 2017-12-03 DIAGNOSIS — L570 Actinic keratosis: Secondary | ICD-10-CM | POA: Diagnosis not present

## 2017-12-03 DIAGNOSIS — D0462 Carcinoma in situ of skin of left upper limb, including shoulder: Secondary | ICD-10-CM | POA: Diagnosis not present

## 2017-12-09 ENCOUNTER — Encounter: Payer: Self-pay | Admitting: Internal Medicine

## 2017-12-31 DIAGNOSIS — H5201 Hypermetropia, right eye: Secondary | ICD-10-CM | POA: Diagnosis not present

## 2017-12-31 DIAGNOSIS — H5212 Myopia, left eye: Secondary | ICD-10-CM | POA: Diagnosis not present

## 2017-12-31 DIAGNOSIS — M25562 Pain in left knee: Secondary | ICD-10-CM | POA: Diagnosis not present

## 2017-12-31 DIAGNOSIS — M170 Bilateral primary osteoarthritis of knee: Secondary | ICD-10-CM | POA: Diagnosis not present

## 2017-12-31 DIAGNOSIS — Z961 Presence of intraocular lens: Secondary | ICD-10-CM | POA: Diagnosis not present

## 2017-12-31 DIAGNOSIS — M25561 Pain in right knee: Secondary | ICD-10-CM | POA: Diagnosis not present

## 2018-01-08 DIAGNOSIS — Z85828 Personal history of other malignant neoplasm of skin: Secondary | ICD-10-CM | POA: Diagnosis not present

## 2018-01-08 DIAGNOSIS — L570 Actinic keratosis: Secondary | ICD-10-CM | POA: Diagnosis not present

## 2018-01-08 DIAGNOSIS — L821 Other seborrheic keratosis: Secondary | ICD-10-CM | POA: Diagnosis not present

## 2018-01-08 DIAGNOSIS — L308 Other specified dermatitis: Secondary | ICD-10-CM | POA: Diagnosis not present

## 2018-01-31 DIAGNOSIS — E039 Hypothyroidism, unspecified: Secondary | ICD-10-CM | POA: Diagnosis not present

## 2018-01-31 DIAGNOSIS — Z23 Encounter for immunization: Secondary | ICD-10-CM | POA: Diagnosis not present

## 2018-01-31 DIAGNOSIS — R0609 Other forms of dyspnea: Secondary | ICD-10-CM | POA: Diagnosis not present

## 2018-02-04 DIAGNOSIS — R030 Elevated blood-pressure reading, without diagnosis of hypertension: Secondary | ICD-10-CM | POA: Diagnosis not present

## 2018-02-11 DIAGNOSIS — I10 Essential (primary) hypertension: Secondary | ICD-10-CM | POA: Diagnosis not present

## 2018-02-25 DIAGNOSIS — R9431 Abnormal electrocardiogram [ECG] [EKG]: Secondary | ICD-10-CM | POA: Diagnosis not present

## 2018-02-25 DIAGNOSIS — R739 Hyperglycemia, unspecified: Secondary | ICD-10-CM | POA: Diagnosis not present

## 2018-02-25 DIAGNOSIS — I10 Essential (primary) hypertension: Secondary | ICD-10-CM | POA: Diagnosis not present

## 2018-02-25 DIAGNOSIS — R0609 Other forms of dyspnea: Secondary | ICD-10-CM | POA: Diagnosis not present

## 2018-03-05 NOTE — Progress Notes (Signed)
Cardiology Office Note   Date:  03/06/2018   ID:  Marvin Jones., DOB Oct 16, 1935, MRN [id number]  PCP:  Marvin Pretty, MD  Cardiologist:   No primary care provider on file. Referring:  Marvin Pretty, MD  No chief complaint on file.     History of Present Illness: Marvin Jones. is a 83 y.o. male who is refereed by Marvin Pretty, MD for evaluation of SOB.  He has had no past cardiac history.  He reports shortness of breath dragging garbage cans up his inclined driveway.  He said this is been ongoing for 2 years.  He does not report that it is increased in severity over those couple of years.  He pedals a stationary bicycle for 10 to 15 minutes at a time and he does not bring the symptoms on.  He says he gets to the top rest for second comes back down into the house and recovers fairly quickly but he has been concerned about the dyspnea.  He does not describe resting shortness of breath, PND or orthopnea.  He has PVCs noted on his EKG but he does not notice any palpitations, presyncope or syncope.  He has no weight gain or edema.   Past Medical History:  Diagnosis Date  . Anxiety and depression    h/o  . Barrett's esophagus   . Diaphragmatic hernia without mention of obstruction or gangrene   . Diverticulosis of colon (without mention of hemorrhage)   . Esophageal reflux   . HTN (hypertension)   . Hyperlipidemia   . Hypertrophy of prostate with urinary obstruction and other lower urinary tract symptoms (LUTS)   . Irritable bowel syndrome   . Personal history of colonic polyps   . Skin cancer    sees Dr Marvin Jones   . Stricture and stenosis of esophagus     Past Surgical History:  Procedure Laterality Date  . TONSILLECTOMY       Current Outpatient Medications  Medication Sig Dispense Refill  . acetaminophen (TYLENOL) 500 MG tablet Take 1,000 mg by mouth every 6 (six) hours as needed for moderate pain.    [name] Kitchen amLODipine (NORVASC) 5 MG tablet Take 5 mg by mouth daily.      [name] Kitchen aspirin 81 MG tablet Take 81 mg by mouth daily.      [name] Kitchen bismuth subsalicylate (PEPTO BISMOL) 262 MG/15ML suspension Take 30 mLs by mouth every 6 (six) hours as needed for indigestion.     . clidinium-chlordiazePOXIDE (LIBRAX) 5-2.5 MG capsule Take 1 capsule by mouth 3 (three) times daily before meals. 90 capsule 3  . dicyclomine (BENTYL) 10 MG capsule Take 1 tab by mouth  Twice daily . 60 capsule 6  . sertraline (ZOLOFT) 50 MG tablet TAKE 1 TABLET BY MOUTH  DAILY 90 tablet 1  . tamsulosin (FLOMAX) 0.4 MG CAPS capsule Take 1 capsule by mouth daily.     No current facility-administered medications for this visit.     Allergies:   Patient has no known allergies.    Social History:  The patient  reports that he has quit smoking. His smoking use included cigars. He has quit using smokeless tobacco. He reports current alcohol use. He reports that he does not use drugs.   Family History:  The patient's family history includes Diabetes in his son; Heart attack (age of onset: 3) in his father.    ROS:  Please see the history of present illness.   Otherwise, review of  systems are positive for none.   All other systems are reviewed and negative.    PHYSICAL EXAM: VS:  BP 140/84   Pulse 67   Ht 5\' 9"  (1.753 m)   Wt 186 lb (84.4 kg)   SpO2 97%   BMI 27.47 kg/m  , BMI Body mass index is 27.47 kg/m. GENERAL:  Well appearing HEENT:  Pupils equal round and reactive, fundi not visualized, oral mucosa unremarkable NECK:  No jugular venous distention, waveform within normal limits, carotid upstroke brisk and symmetric, no bruits, no thyromegaly LYMPHATICS:  No cervical, inguinal adenopathy LUNGS:  Clear to auscultation bilaterally BACK:  No CVA tenderness CHEST:  Unremarkable HEART:  PMI not displaced or sustained,S1 and S2 within normal limits, no S3, no S4, no clicks, no rubs, no murmurs ABD:  Flat, positive bowel sounds normal in frequency in pitch, no bruits, no rebound, no guarding, no  midline pulsatile mass, no hepatomegaly, no splenomegaly EXT:  2 plus pulses throughout, no edema, no cyanosis no clubbing SKIN:  No rashes no nodules NEURO:  Cranial nerves II through XII grossly intact, motor grossly intact throughout PSYCH:  Cognitively intact, oriented to person place and time    EKG:  EKG is ordered today. The ekg ordered today demonstrates sinus rhythm, rate 67 axis within normal limits, intervals within normal limits, no acute ST-T wave changes, premature ventricular contractions.   Recent Labs: 05/20/2017: BUN 21; Creatinine, Ser 1.11; Hemoglobin 15.2; Platelets 227; Potassium 3.9; Sodium 139    Lipid Panel    Component Value Date/Time   CHOL 196 03/09/2014 0957   TRIG 160.0 (H) 03/09/2014 0957   HDL 45.00 03/09/2014 0957   CHOLHDL 4 03/09/2014 0957   VLDL 32.0 03/09/2014 0957   LDLCALC 119 (H) 03/09/2014 0957   LDLDIRECT 128.5 01/30/2011 1110      Wt Readings from Last 3 Encounters:  03/06/18 186 lb (84.4 kg)  05/20/17 184 lb (83.5 kg)  03/10/16 186 lb (84.4 kg)      Other studies Reviewed: Additional studies/ records that were reviewed today include: Office records. Review of the above records demonstrates:  Please see elsewhere in the note.     ASSESSMENT AND PLAN:  DYSPNEA:   This could be an anginal equivalent.  We discussed possible perfusion imaging.  He would prefer to avoid any injections.  We talked about specificity and sensitivity.  I do not think it is unreasonable to do a POET (Plain Old Exercise Treadmill).  However, I would clearly want this to be a maximal test.  In addition if he had increasing symptoms following this we would likely need different testing but if he has a good result with a good exercise profile I will believe this to be true negative in the absence of progressive symptoms.  DYSLIPIDEMIA:   He says this is borderline but has not had it checked in a few years and we will order a fasting lipid profile.  PVCs: He  does not notice these.  No change in therapy.  HTN: He recently had Norvasc added to his regimen.  I will defer to Marvin Pretty, MD  Current medicines are reviewed at length with the patient today.  The patient does not have concerns regarding medicines.  The following changes have been made:  no change  Labs/ tests ordered today include:   Orders Placed This Encounter  Procedures  . Lipid panel  . Hepatic function panel  . EXERCISE TOLERANCE TEST (ETT)  . EKG 12-Lead  Disposition:   FU with me in four months.     Signed, Minus Breeding, MD  03/06/2018 12:46 PM    [place]

## 2018-03-06 ENCOUNTER — Encounter: Payer: Self-pay | Admitting: Cardiology

## 2018-03-06 ENCOUNTER — Ambulatory Visit: Payer: Medicare Other | Admitting: Cardiology

## 2018-03-06 VITALS — BP 140/84 | HR 67 | Ht 69.0 in | Wt 186.0 lb

## 2018-03-06 DIAGNOSIS — R0600 Dyspnea, unspecified: Secondary | ICD-10-CM

## 2018-03-06 DIAGNOSIS — I493 Ventricular premature depolarization: Secondary | ICD-10-CM

## 2018-03-06 DIAGNOSIS — E785 Hyperlipidemia, unspecified: Secondary | ICD-10-CM | POA: Diagnosis not present

## 2018-03-06 DIAGNOSIS — R0609 Other forms of dyspnea: Secondary | ICD-10-CM | POA: Insufficient documentation

## 2018-03-06 DIAGNOSIS — I10 Essential (primary) hypertension: Secondary | ICD-10-CM | POA: Diagnosis not present

## 2018-03-06 DIAGNOSIS — Z79899 Other long term (current) drug therapy: Secondary | ICD-10-CM | POA: Diagnosis not present

## 2018-03-06 NOTE — Patient Instructions (Signed)
Medication Instructions:  Continue current medications  If you need a refill on your cardiac medications before your next appointment, please call your pharmacy.  Labwork: None Ordered   Take the provided lab slips with you to the lab for your blood draw.  When you have your labs (blood work) drawn today and your tests are completely normal, you will receive your results only by MyChart Message (if you have MyChart) -OR-  A paper copy in the mail.  If you have any lab test that is abnormal or we need to change your treatment, we will call you to review these results.  Testing/Procedures: Your physician has requested that you have an exercise tolerance test. For further information please visit [email or web address]. Please also follow instruction sheet, as given.  Follow-Up: You will need a follow up appointment in 4 months.  Please call our office 2 months in advance to schedule this appointment.  You may see Dr Percival Spanish or one of the following Advanced Practice Providers on your designated Care Team:   Rosaria Ferries, PA-C . Jory Sims, DNP, ANP   At [facility], you and your health needs are our priority.  As part of our continuing mission to provide you with exceptional heart care, we have created designated Provider Care Teams.  These Care Teams include your primary Cardiologist (physician) and Advanced Practice Providers (APPs -  Physician Assistants and Nurse Practitioners) who all work together to provide you with the care you need, when you need it.  Thank you for choosing [facility] at [facility]!!

## 2018-03-07 ENCOUNTER — Telehealth (HOSPITAL_COMMUNITY): Payer: Self-pay

## 2018-03-07 NOTE — Telephone Encounter (Signed)
Encounter complete. 

## 2018-03-08 ENCOUNTER — Ambulatory Visit (HOSPITAL_COMMUNITY)
Admission: RE | Admit: 2018-03-08 | Discharge: 2018-03-08 | Disposition: A | Discharge status: Home / Self Care | Payer: Medicare Other | Source: Ambulatory Visit | Attending: Internal Medicine | Admitting: Internal Medicine

## 2018-03-08 DIAGNOSIS — R0609 Other forms of dyspnea: Secondary | ICD-10-CM | POA: Diagnosis not present

## 2018-03-08 DIAGNOSIS — R0600 Dyspnea, unspecified: Secondary | ICD-10-CM

## 2018-03-08 LAB — EXERCISE TOLERANCE TEST
Estimated workload: 7.5 METS
Exercise duration (min): 6 min
Exercise duration (sec): 22 s
MPHR: 140 {beats}/min
Peak HR: 157 {beats}/min
Percent HR: 112 %
RPE: 18
Rest HR: 86 {beats}/min

## 2018-03-14 DIAGNOSIS — I10 Essential (primary) hypertension: Secondary | ICD-10-CM | POA: Diagnosis not present

## 2018-03-26 DIAGNOSIS — L812 Freckles: Secondary | ICD-10-CM | POA: Diagnosis not present

## 2018-03-26 DIAGNOSIS — L570 Actinic keratosis: Secondary | ICD-10-CM | POA: Diagnosis not present

## 2018-03-26 DIAGNOSIS — B351 Tinea unguium: Secondary | ICD-10-CM | POA: Diagnosis not present

## 2018-03-26 DIAGNOSIS — L821 Other seborrheic keratosis: Secondary | ICD-10-CM | POA: Diagnosis not present

## 2018-03-26 DIAGNOSIS — Z85828 Personal history of other malignant neoplasm of skin: Secondary | ICD-10-CM | POA: Diagnosis not present

## 2018-04-03 DIAGNOSIS — K2270 Barrett's esophagus without dysplasia: Secondary | ICD-10-CM | POA: Diagnosis not present

## 2018-04-03 DIAGNOSIS — I10 Essential (primary) hypertension: Secondary | ICD-10-CM | POA: Diagnosis not present

## 2018-04-03 DIAGNOSIS — M810 Age-related osteoporosis without current pathological fracture: Secondary | ICD-10-CM | POA: Diagnosis not present

## 2018-04-03 DIAGNOSIS — Z7982 Long term (current) use of aspirin: Secondary | ICD-10-CM | POA: Diagnosis not present

## 2018-04-10 DIAGNOSIS — M48061 Spinal stenosis, lumbar region without neurogenic claudication: Secondary | ICD-10-CM | POA: Diagnosis not present

## 2018-04-10 DIAGNOSIS — Z0001 Encounter for general adult medical examination with abnormal findings: Secondary | ICD-10-CM | POA: Diagnosis not present

## 2018-04-10 DIAGNOSIS — K589 Irritable bowel syndrome without diarrhea: Secondary | ICD-10-CM | POA: Diagnosis not present

## 2018-04-22 DIAGNOSIS — M25561 Pain in right knee: Secondary | ICD-10-CM | POA: Diagnosis not present

## 2018-04-22 DIAGNOSIS — M170 Bilateral primary osteoarthritis of knee: Secondary | ICD-10-CM | POA: Diagnosis not present

## 2018-04-22 DIAGNOSIS — M25562 Pain in left knee: Secondary | ICD-10-CM | POA: Diagnosis not present

## 2018-07-07 NOTE — Progress Notes (Unsigned)
{Choose 1 Note Type (Telehealth Visit or Telephone Visit):[id number]}   Date:  07/07/2018   ID:  Marvin Eaton., DOB 12/13/1935, MRN [id number]  {Patient [phone]::"Home"} {Provider Location:[phone]::"Home"}  PCP:  Deland Pretty, MD  Cardiologist:  No primary care provider on file. *** Electrophysiologist:  None   Evaluation Performed:  {Choose Visit Type:[id number]::"Follow-Up Visit"}  Chief Complaint:  ***  History of Present Illness:    Marvin Shidler. is a 83 y.o. male with SOB.  I saw him for this in January.  He had a POET (Plain Old Exercise Treadmill) that was negative for ischemia.  ***  The patient {does/does not:200015} have symptoms concerning for COVID-19 infection (fever, chills, cough, or new shortness of breath).    Past Medical History:  Diagnosis Date  . Anxiety and depression    h/o  . Barrett's esophagus   . Diaphragmatic hernia without mention of obstruction or gangrene   . Diverticulosis of colon (without mention of hemorrhage)   . Esophageal reflux   . HTN (hypertension)   . Hyperlipidemia   . Hypertrophy of prostate with urinary obstruction and other lower urinary tract symptoms (LUTS)   . Irritable bowel syndrome   . Personal history of colonic polyps   . Skin cancer    sees Dr Ronnald Ramp   . Stricture and stenosis of esophagus    Past Surgical History:  Procedure Laterality Date  . TONSILLECTOMY       No outpatient medications have been marked as taking for the 07/08/18 encounter (Appointment) with Minus Breeding, MD.     Allergies:   Patient has no known allergies.   Social History   Tobacco Use  . Smoking status: Former Smoker    Types: Cigars  . Smokeless tobacco: Former Systems developer  . Tobacco comment: cigars, quit ~ 2005  Substance Use Topics  . Alcohol use: Yes    Alcohol/week: 0.0 standard drinks    Comment: 3 drinks per week  . Drug use: No     Family Hx: The patient's family history includes Diabetes in his  son; Heart attack (age of onset: 53) in his father. There is no history of Colon cancer, Prostate cancer, or Hypertension.  ROS:   Please see the history of present illness.    *** All other systems reviewed and are negative.   Prior CV studies:   The following studies were reviewed today:  ***  Labs/Other Tests and Data Reviewed:    EKG:  {EKG/Telemetry Strips [phone]}  Recent Labs: No results found for requested labs within last 8760 hours.   Recent Lipid Panel Lab Results  Component Value Date/Time   CHOL 196 03/09/2014 09:57 AM   TRIG 160.0 (H) 03/09/2014 09:57 AM   HDL 45.00 03/09/2014 09:57 AM   CHOLHDL 4 03/09/2014 09:57 AM   LDLCALC 119 (H) 03/09/2014 09:57 AM   LDLDIRECT 128.5 01/30/2011 11:10 AM    Wt Readings from Last 3 Encounters:  03/06/18 186 lb (84.4 kg)  05/20/17 184 lb (83.5 kg)  03/10/16 186 lb (84.4 kg)     Objective:    Vital Signs:  There were no vitals taken for this visit.   {HeartCare Virtual Exam (Optional):[phone]::"VITAL SIGNS:  reviewed"}  ASSESSMENT & PLAN:    DYSPNEA:   ***  This could be an anginal equivalent.  We discussed possible perfusion imaging.  He would prefer to avoid any injections.  We talked about specificity and sensitivity.  I do not think it is unreasonable to  do a POET (Plain Old Exercise Treadmill).  However, I would clearly want this to be a maximal test.  In addition if he had increasing symptoms following this we would likely need different testing but if he has a good result with a good exercise profile I will believe this to be true negative in the absence of progressive symptoms.  DYSLIPIDEMIA:   ***  He says this is borderline but has not had it checked in a few years and we will order a fasting lipid profile.  PVCs: ***  He does not notice these.  No change in therapy.  HTN:   ***  He recently had Norvasc added to his regimen.  I will defer to Deland Pretty, MD  COVID-19 Education: The  signs and symptoms of COVID-19 were discussed with the patient and how to seek care for testing (follow up with PCP or arrange E-visit).  ***The importance of social distancing was discussed today.  Time:   Today, I have spent *** minutes with the patient with telehealth technology discussing the above problems.     Medication Adjustments/Labs and Tests Ordered: Current medicines are reviewed at length with the patient today.  Concerns regarding medicines are outlined above.   Tests Ordered: No orders of the defined types were placed in this encounter.   Medication Changes: No orders of the defined types were placed in this encounter.   Disposition:  Follow up {follow up:15908}  Signed, Minus Breeding, MD  07/07/2018 8:41 PM     [facility]

## 2018-07-08 ENCOUNTER — Telehealth: Payer: Medicare Other | Admitting: Cardiology

## 2018-07-12 DIAGNOSIS — H01002 Unspecified blepharitis right lower eyelid: Secondary | ICD-10-CM | POA: Diagnosis not present

## 2018-07-12 DIAGNOSIS — H01001 Unspecified blepharitis right upper eyelid: Secondary | ICD-10-CM | POA: Diagnosis not present

## 2018-08-07 DIAGNOSIS — L570 Actinic keratosis: Secondary | ICD-10-CM | POA: Diagnosis not present

## 2018-08-07 DIAGNOSIS — L820 Inflamed seborrheic keratosis: Secondary | ICD-10-CM | POA: Diagnosis not present

## 2018-09-12 ENCOUNTER — Other Ambulatory Visit: Payer: Self-pay

## 2018-09-12 MED ORDER — SERTRALINE HCL 50 MG PO TABS: 50.0000 mg | ORAL_TABLET | Freq: Every day | ORAL | 1 refills | Status: DC

## 2018-10-29 DIAGNOSIS — M25562 Pain in left knee: Secondary | ICD-10-CM | POA: Diagnosis not present

## 2018-10-29 DIAGNOSIS — M25561 Pain in right knee: Secondary | ICD-10-CM | POA: Diagnosis not present

## 2018-11-01 DIAGNOSIS — H01115 Allergic dermatitis of left lower eyelid: Secondary | ICD-10-CM | POA: Diagnosis not present

## 2018-11-01 DIAGNOSIS — H01005 Unspecified blepharitis left lower eyelid: Secondary | ICD-10-CM | POA: Diagnosis not present

## 2018-11-01 DIAGNOSIS — H01002 Unspecified blepharitis right lower eyelid: Secondary | ICD-10-CM | POA: Diagnosis not present

## 2018-11-01 DIAGNOSIS — H01112 Allergic dermatitis of right lower eyelid: Secondary | ICD-10-CM | POA: Diagnosis not present

## 2018-11-04 DIAGNOSIS — L918 Other hypertrophic disorders of the skin: Secondary | ICD-10-CM | POA: Diagnosis not present

## 2018-11-04 DIAGNOSIS — Z85828 Personal history of other malignant neoplasm of skin: Secondary | ICD-10-CM | POA: Diagnosis not present

## 2018-11-04 DIAGNOSIS — L812 Freckles: Secondary | ICD-10-CM | POA: Diagnosis not present

## 2018-11-04 DIAGNOSIS — L821 Other seborrheic keratosis: Secondary | ICD-10-CM | POA: Diagnosis not present

## 2018-11-04 DIAGNOSIS — L570 Actinic keratosis: Secondary | ICD-10-CM | POA: Diagnosis not present

## 2018-11-26 DIAGNOSIS — H01002 Unspecified blepharitis right lower eyelid: Secondary | ICD-10-CM | POA: Diagnosis not present

## 2018-11-26 DIAGNOSIS — H01005 Unspecified blepharitis left lower eyelid: Secondary | ICD-10-CM | POA: Diagnosis not present

## 2018-12-10 DIAGNOSIS — Z23 Encounter for immunization: Secondary | ICD-10-CM | POA: Diagnosis not present

## 2018-12-12 DIAGNOSIS — Z85828 Personal history of other malignant neoplasm of skin: Secondary | ICD-10-CM | POA: Diagnosis not present

## 2018-12-12 DIAGNOSIS — L820 Inflamed seborrheic keratosis: Secondary | ICD-10-CM | POA: Diagnosis not present

## 2019-01-13 DIAGNOSIS — H40053 Ocular hypertension, bilateral: Secondary | ICD-10-CM | POA: Diagnosis not present

## 2019-01-13 DIAGNOSIS — H5201 Hypermetropia, right eye: Secondary | ICD-10-CM | POA: Diagnosis not present

## 2019-01-13 DIAGNOSIS — Z961 Presence of intraocular lens: Secondary | ICD-10-CM | POA: Diagnosis not present

## 2019-01-13 DIAGNOSIS — H01002 Unspecified blepharitis right lower eyelid: Secondary | ICD-10-CM | POA: Diagnosis not present

## 2019-01-27 DIAGNOSIS — R3914 Feeling of incomplete bladder emptying: Secondary | ICD-10-CM | POA: Diagnosis not present

## 2019-02-07 DIAGNOSIS — M170 Bilateral primary osteoarthritis of knee: Secondary | ICD-10-CM | POA: Diagnosis not present

## 2019-02-07 DIAGNOSIS — M25561 Pain in right knee: Secondary | ICD-10-CM | POA: Diagnosis not present

## 2019-02-07 DIAGNOSIS — M1711 Unilateral primary osteoarthritis, right knee: Secondary | ICD-10-CM | POA: Diagnosis not present

## 2019-02-07 DIAGNOSIS — M25562 Pain in left knee: Secondary | ICD-10-CM | POA: Diagnosis not present

## 2019-02-27 ENCOUNTER — Ambulatory Visit: Payer: Medicare Other | Attending: Internal Medicine

## 2019-02-27 DIAGNOSIS — Z20822 Contact with and (suspected) exposure to covid-19: Secondary | ICD-10-CM

## 2019-03-01 LAB — NOVEL CORONAVIRUS, NAA: SARS-CoV-2, NAA: NOT DETECTED

## 2019-03-03 ENCOUNTER — Telehealth: Payer: Self-pay | Admitting: General Practice

## 2019-03-03 NOTE — Telephone Encounter (Signed)
Negative COVID results given. Patient results "NOT Detected." Caller expressed understanding. ° °

## 2019-03-12 ENCOUNTER — Ambulatory Visit: Payer: Medicare Other | Attending: Internal Medicine

## 2019-03-12 DIAGNOSIS — Z23 Encounter for immunization: Secondary | ICD-10-CM | POA: Insufficient documentation

## 2019-03-12 NOTE — Progress Notes (Signed)
   [id number]  Name:  Marvin Jones.    MRN: [id number] DOB: 03/22/1935  03/12/2019  [name] was observed post Covid-19 immunization for 15 minutes without incidence. He was provided with Vaccine Information Sheet and instruction to access the V-Safe system.   [name] was instructed to call 911 with any severe reactions post vaccine: [name] Kitchen Difficulty breathing  . Swelling of your face and throat  . A fast heartbeat  . A bad rash all over your body  . Dizziness and weakness    Immunizations Administered    Name Date Dose VIS Date Route   Pfizer COVID-19 Vaccine 03/12/2019 10:20 AM 0.3 mL 01/31/2019 Intramuscular   Manufacturer: [employer], [employer]   Lot: [id number]   [place]: [id number]

## 2019-04-02 ENCOUNTER — Ambulatory Visit: Payer: Medicare Other | Attending: Internal Medicine

## 2019-04-02 DIAGNOSIS — Z23 Encounter for immunization: Secondary | ICD-10-CM | POA: Insufficient documentation

## 2019-04-02 NOTE — Progress Notes (Signed)
   [id number] Vaccination [facility]  Name:  Markavion Chrisler.    MRN: [id number] DOB: 1936-02-04  04/02/2019  Mr. Hobgood was observed post Covid-19 immunization for 15 minutes without incidence. He was provided with Vaccine Information Sheet and instruction to access the V-Safe system.   Mr. Casillo was instructed to call 911 with any severe reactions post vaccine: [name] Kitchen Difficulty breathing  . Swelling of your face and throat  . A fast heartbeat  . A bad rash all over your body  . Dizziness and weakness    Immunizations Administered    Name Date Dose VIS Date Route   Pfizer COVID-19 Vaccine 04/02/2019  1:30 PM 0.3 mL 01/31/2019 Intramuscular   Manufacturer: [place]   Lot: [id number]   [place]: [id number]

## 2019-05-14 DIAGNOSIS — L918 Other hypertrophic disorders of the skin: Secondary | ICD-10-CM | POA: Diagnosis not present

## 2019-05-14 DIAGNOSIS — D485 Neoplasm of uncertain behavior of skin: Secondary | ICD-10-CM | POA: Diagnosis not present

## 2019-05-14 DIAGNOSIS — L821 Other seborrheic keratosis: Secondary | ICD-10-CM | POA: Diagnosis not present

## 2019-05-14 DIAGNOSIS — C44719 Basal cell carcinoma of skin of left lower limb, including hip: Secondary | ICD-10-CM | POA: Diagnosis not present

## 2019-05-14 DIAGNOSIS — L570 Actinic keratosis: Secondary | ICD-10-CM | POA: Diagnosis not present

## 2019-05-14 DIAGNOSIS — Z85828 Personal history of other malignant neoplasm of skin: Secondary | ICD-10-CM | POA: Diagnosis not present

## 2019-05-29 DIAGNOSIS — I10 Essential (primary) hypertension: Secondary | ICD-10-CM | POA: Diagnosis not present

## 2019-05-29 DIAGNOSIS — Z7982 Long term (current) use of aspirin: Secondary | ICD-10-CM | POA: Diagnosis not present

## 2019-05-29 DIAGNOSIS — E039 Hypothyroidism, unspecified: Secondary | ICD-10-CM | POA: Diagnosis not present

## 2019-05-29 DIAGNOSIS — M810 Age-related osteoporosis without current pathological fracture: Secondary | ICD-10-CM | POA: Diagnosis not present

## 2019-06-11 DIAGNOSIS — Z0001 Encounter for general adult medical examination with abnormal findings: Secondary | ICD-10-CM | POA: Diagnosis not present

## 2019-06-11 DIAGNOSIS — K219 Gastro-esophageal reflux disease without esophagitis: Secondary | ICD-10-CM | POA: Diagnosis not present

## 2019-07-08 DIAGNOSIS — M170 Bilateral primary osteoarthritis of knee: Secondary | ICD-10-CM | POA: Diagnosis not present

## 2019-07-08 DIAGNOSIS — M25561 Pain in right knee: Secondary | ICD-10-CM | POA: Diagnosis not present

## 2019-07-08 DIAGNOSIS — M25562 Pain in left knee: Secondary | ICD-10-CM | POA: Diagnosis not present

## 2019-08-28 DIAGNOSIS — H10413 Chronic giant papillary conjunctivitis, bilateral: Secondary | ICD-10-CM | POA: Diagnosis not present

## 2019-08-29 ENCOUNTER — Telehealth: Payer: Self-pay | Admitting: Physician Assistant

## 2019-08-29 ENCOUNTER — Other Ambulatory Visit: Payer: Self-pay | Admitting: Internal Medicine

## 2019-08-29 MED ORDER — DICYCLOMINE HCL 10 MG PO CAPS: ORAL_CAPSULE | ORAL | 0 refills | Status: DC

## 2019-08-29 NOTE — Telephone Encounter (Signed)
Sent in a 30 day supply sent to [facility]. Patient has been and called and notified. I did advise that he has not been seen in over 3 years, and that he would need to schedule a follow an appointment if he need more refills. Marvin Jones expressed that he understood and plans to call back and schedule an appointment.

## 2019-08-29 NOTE — Telephone Encounter (Signed)
This patient hasn't been seen in the office since the beginning of 2018. I'm sure he needs an appointment due to his GI history, but is it possible to send him a few days in if he schedules an appointment?

## 2019-08-29 NOTE — Telephone Encounter (Signed)
Yes, Ok to refill x1, then would need office visit

## 2019-09-03 ENCOUNTER — Other Ambulatory Visit: Payer: Self-pay

## 2019-09-03 MED ORDER — DICYCLOMINE HCL 10 MG PO CAPS: ORAL_CAPSULE | ORAL | 3 refills | Status: DC

## 2019-09-04 DIAGNOSIS — M170 Bilateral primary osteoarthritis of knee: Secondary | ICD-10-CM | POA: Diagnosis not present

## 2019-09-11 DIAGNOSIS — M170 Bilateral primary osteoarthritis of knee: Secondary | ICD-10-CM | POA: Diagnosis not present

## 2019-09-23 DIAGNOSIS — L821 Other seborrheic keratosis: Secondary | ICD-10-CM | POA: Diagnosis not present

## 2019-09-23 DIAGNOSIS — L570 Actinic keratosis: Secondary | ICD-10-CM | POA: Diagnosis not present

## 2019-09-23 DIAGNOSIS — D692 Other nonthrombocytopenic purpura: Secondary | ICD-10-CM | POA: Diagnosis not present

## 2019-09-23 DIAGNOSIS — Z85828 Personal history of other malignant neoplasm of skin: Secondary | ICD-10-CM | POA: Diagnosis not present

## 2019-09-23 DIAGNOSIS — L812 Freckles: Secondary | ICD-10-CM | POA: Diagnosis not present

## 2019-09-25 DIAGNOSIS — M170 Bilateral primary osteoarthritis of knee: Secondary | ICD-10-CM | POA: Diagnosis not present

## 2019-11-04 DIAGNOSIS — H938X2 Other specified disorders of left ear: Secondary | ICD-10-CM | POA: Diagnosis not present

## 2019-11-04 DIAGNOSIS — H9192 Unspecified hearing loss, left ear: Secondary | ICD-10-CM | POA: Diagnosis not present

## 2019-11-07 ENCOUNTER — Encounter (HOSPITAL_COMMUNITY): Payer: Self-pay

## 2019-11-07 ENCOUNTER — Emergency Department (HOSPITAL_COMMUNITY)
Admission: EM | Admit: 2019-11-07 | Discharge: 2019-11-07 | Disposition: A | Discharge status: Home / Self Care | Payer: Medicare Other | Attending: Emergency Medicine | Admitting: Emergency Medicine

## 2019-11-07 ENCOUNTER — Other Ambulatory Visit: Payer: Self-pay

## 2019-11-07 DIAGNOSIS — H9390 Unspecified disorder of ear, unspecified ear: Secondary | ICD-10-CM | POA: Insufficient documentation

## 2019-11-07 DIAGNOSIS — Z5321 Procedure and treatment not carried out due to patient leaving prior to being seen by health care provider: Secondary | ICD-10-CM | POA: Diagnosis not present

## 2019-11-07 NOTE — ED Triage Notes (Signed)
Patient states he has "2 stopped up ears" and has had for 10 days. Patient states that he flew from [place] at this time and when issue started he went to a PA and was ordered 2 decongestants. Patient states the problem is no better.

## 2019-11-07 NOTE — ED Notes (Signed)
Patient called for vital recheck no answer

## 2019-11-11 ENCOUNTER — Ambulatory Visit (INDEPENDENT_AMBULATORY_CARE_PROVIDER_SITE_OTHER): Payer: Medicare Other | Admitting: Otolaryngology

## 2019-11-11 ENCOUNTER — Other Ambulatory Visit: Payer: Self-pay

## 2019-11-11 ENCOUNTER — Encounter (INDEPENDENT_AMBULATORY_CARE_PROVIDER_SITE_OTHER): Payer: Self-pay | Admitting: Otolaryngology

## 2019-11-11 VITALS — Temp 97.3°F

## 2019-11-11 DIAGNOSIS — H6522 Chronic serous otitis media, left ear: Secondary | ICD-10-CM | POA: Diagnosis not present

## 2019-11-11 NOTE — Progress Notes (Signed)
HPI: Marvin Jones. is a 84 y.o. male who presents for evaluation of blockage of his hearing on the left side now for about 2 weeks.  He has underlying hearing loss no over the past 2 weeks has had further hearing loss on the left side.  He was treated with prednisone and antibiotics by his medical doctor.  He has also been prescribed Flonase.  He describes pressure in the left ear.  Past Medical History:  Diagnosis Date  . Anxiety and depression    h/o  . Barrett's esophagus   . Diaphragmatic hernia without mention of obstruction or gangrene   . Diverticulosis of colon (without mention of hemorrhage)   . Esophageal reflux   . HTN (hypertension)   . Hyperlipidemia   . Hypertrophy of prostate with urinary obstruction and other lower urinary tract symptoms (LUTS)   . Irritable bowel syndrome   . Personal history of colonic polyps   . Skin cancer    sees Dr Ronnald Ramp   . Stricture and stenosis of esophagus    Past Surgical History:  Procedure Laterality Date  . TONSILLECTOMY     Social History   Socioeconomic History  . Marital status: Widowed    Spouse name: Not on file  . Number of children: 3  . Years of education: Not on file  . Highest education level: Not on file  Occupational History  . Occupation: retired-- Research scientist (life sciences)   Tobacco Use  . Smoking status: Former Smoker    Types: Cigars  . Smokeless tobacco: Never Used  . Tobacco comment: cigars, quit ~ 2005  Vaping Use  . Vaping Use: Never used  Substance and Sexual Activity  . Alcohol use: Yes    Alcohol/week: 0.0 standard drinks    Comment: 3 drinks per week  . Drug use: No  . Sexual activity: Not on file  Other Topics Concern  . Not on file  Social History Narrative   Household-- pt and wife, marriage healthy   3 independent adult children   Social Determinants of Health   Financial Resource Strain:   . Difficulty of Paying Living Expenses: Not on file  Food Insecurity:   . Worried About Ship broker in the Last Year: Not on file  . Ran Out of Food in the Last Year: Not on file  Transportation Needs:   . Lack of Transportation (Medical): Not on file  . Lack of Transportation (Non-Medical): Not on file  Physical Activity:   . Days of Exercise per Week: Not on file  . Minutes of Exercise per Session: Not on file  Stress:   . Feeling of Stress : Not on file  Social Connections:   . Frequency of Communication with Friends and Family: Not on file  . Frequency of Social Gatherings with Friends and Family: Not on file  . Attends Religious Services: Not on file  . Active Member of Clubs or Organizations: Not on file  . Attends Archivist Meetings: Not on file  . Marital Status: Not on file   Family History  Problem Relation Age of Onset  . Heart attack Father 52  . Diabetes Son   . Colon cancer Neg Hx   . Prostate cancer Neg Hx   . Hypertension Neg Hx    No Known Allergies Prior to Admission medications   Medication Sig Start Date End Date Taking? Authorizing Provider  acetaminophen (TYLENOL) 500 MG tablet Take 1,000 mg by mouth every  6 (six) hours as needed for moderate pain.   Yes [provider]  amLODipine (NORVASC) 5 MG tablet Take 5 mg by mouth daily.   Yes [provider]  aspirin 81 MG tablet Take 81 mg by mouth daily.     Yes [provider]  bismuth subsalicylate (PEPTO BISMOL) 262 MG/15ML suspension Take 30 mLs by mouth every 6 (six) hours as needed for indigestion.    Yes [provider]  clidinium-chlordiazePOXIDE (LIBRAX) 5-2.5 MG capsule Take 1 capsule by mouth 3 (three) times daily before meals. 04/10/16  Yes Ester[place], Amy S, PA-C  dicyclomine (BENTYL) 10 MG capsule TAKE ONE CAPSULE TWICE A DAY.  Patient needs office visit for further refills 09/03/19  Yes Irene Shipper, MD  sertraline (ZOLOFT) 50 MG tablet Take 1 tablet (50 mg total) by mouth daily. 09/12/18  Yes Irene Shipper, MD  tamsulosin (FLOMAX) 0.4 MG CAPS  capsule Take 1 capsule by mouth daily. 10/13/15  Yes [provider]     Positive ROS: Otherwise negative  All other systems have been reviewed and were otherwise negative with the exception of those mentioned in the HPI and as above.  Physical Exam: Constitutional: Alert, well-appearing, no acute distress Ears: External ears without lesions or tenderness. Ear canals are clear bilaterally.  Right TM is clear.  Left TM with a serous otitis media. Nasal: External nose without lesions. Septum midline with mild rhinitis.  Both middle meatus regions are clear with no clinical evidence of active infection..  Nasopharynx appeared clear and nasal passageway was patent. Oral: Lips and gums without lesions. Tongue and palate mucosa without lesions. Posterior oropharynx clear.  Mirror examination of the nasopharynx was clear. Neck: No palpable adenopathy or masses Respiratory: Breathing comfortably  Skin: No facial/neck lesions or rash noted.  Myringotomy  Date/Time: 11/11/2019 1:49 PM Performed by: Rozetta Nunnery, MD Authorized by: Rozetta Nunnery, MD   Consent:    Consent obtained:  Verbal   Consent given by:  Patient   Risks discussed:  Bleeding and pain   Alternatives discussed:  No treatment Pre-procedure details:    Indications: serous otitis media   Anesthesia:    Anesthesia method:  Topical application   Topical anesthetic:  Phenol Procedure Details:    Location:  Left TM   Hole made in:  Inferior aspect of TM   Tube:  None Findings:    Fluid:  Serous fluid Post-procedure details:    Patient tolerance of procedure:  Tolerated well, no immediate complications Comments:     A myringotomy was performed on the left side and a serous effusion was aspirated.  Hearing was improved.  Also insufflated some air through the nasopharynx and eustachian tube and out the perforation.    Assessment: Left serous otitis media for the past 2 weeks with underlying  SNHL.  Plan: At patient's request a myringotomy was performed in the office today with improvement of his hearing. Recommend keeping water out of the left ear. Recommended use of Flonase 2 sprays each nostril at night and complete the remaining antibiotics. Discussed with him that the myringotomy site will close within the next 10 to 14 days. He will follow-up as needed  Radene Journey, MD

## 2019-11-21 ENCOUNTER — Telehealth (INDEPENDENT_AMBULATORY_CARE_PROVIDER_SITE_OTHER): Payer: Self-pay

## 2019-11-24 ENCOUNTER — Encounter (INDEPENDENT_AMBULATORY_CARE_PROVIDER_SITE_OTHER): Payer: Self-pay | Admitting: Otolaryngology

## 2019-11-24 ENCOUNTER — Other Ambulatory Visit: Payer: Self-pay

## 2019-11-24 ENCOUNTER — Ambulatory Visit (INDEPENDENT_AMBULATORY_CARE_PROVIDER_SITE_OTHER): Payer: Medicare Other | Admitting: Otolaryngology

## 2019-11-24 VITALS — Temp 97.5°F

## 2019-11-24 DIAGNOSIS — H903 Sensorineural hearing loss, bilateral: Secondary | ICD-10-CM

## 2019-11-24 NOTE — Progress Notes (Signed)
HPI: Marvin Jones. is a 84 y.o. male who returns today for evaluation of hearing problems.  He got an audiogram performed and has had hearing aids ordered but have not gotten them yet.  I performed a myringotomy on him 10 days ago because of a left serous otitis and he feels like the left ear is a little congested.  He has had no drainage from the ear.  He has been using nasal steroid spray regularly..  Past Medical History:  Diagnosis Date  . Anxiety and depression    h/o  . Barrett's esophagus   . Diaphragmatic hernia without mention of obstruction or gangrene   . Diverticulosis of colon (without mention of hemorrhage)   . Esophageal reflux   . HTN (hypertension)   . Hyperlipidemia   . Hypertrophy of prostate with urinary obstruction and other lower urinary tract symptoms (LUTS)   . Irritable bowel syndrome   . Personal history of colonic polyps   . Skin cancer    sees Dr Ronnald Ramp   . Stricture and stenosis of esophagus    Past Surgical History:  Procedure Laterality Date  . TONSILLECTOMY     Social History   Socioeconomic History  . Marital status: Widowed    Spouse name: Not on file  . Number of children: 3  . Years of education: Not on file  . Highest education level: Not on file  Occupational History  . Occupation: retired-- Research scientist (life sciences)   Tobacco Use  . Smoking status: Former Smoker    Types: Cigars  . Smokeless tobacco: Never Used  . Tobacco comment: cigars, quit ~ 2005  Vaping Use  . Vaping Use: Never used  Substance and Sexual Activity  . Alcohol use: Yes    Alcohol/week: 0.0 standard drinks    Comment: 3 drinks per week  . Drug use: No  . Sexual activity: Not on file  Other Topics Concern  . Not on file  Social History Narrative   Household-- pt and wife, marriage healthy   3 independent adult children   Social Determinants of Health   Financial Resource Strain:   . Difficulty of Paying Living Expenses: Not on file  Food Insecurity:   .  Worried About Charity fundraiser in the Last Year: Not on file  . Ran Out of Food in the Last Year: Not on file  Transportation Needs:   . Lack of Transportation (Medical): Not on file  . Lack of Transportation (Non-Medical): Not on file  Physical Activity:   . Days of Exercise per Week: Not on file  . Minutes of Exercise per Session: Not on file  Stress:   . Feeling of Stress : Not on file  Social Connections:   . Frequency of Communication with Friends and Family: Not on file  . Frequency of Social Gatherings with Friends and Family: Not on file  . Attends Religious Services: Not on file  . Active Member of Clubs or Organizations: Not on file  . Attends Archivist Meetings: Not on file  . Marital Status: Not on file   Family History  Problem Relation Age of Onset  . Heart attack Father 51  . Diabetes Son   . Colon cancer Neg Hx   . Prostate cancer Neg Hx   . Hypertension Neg Hx    No Known Allergies Prior to Admission medications   Medication Sig Start Date End Date Taking? Authorizing Provider  acetaminophen (TYLENOL) 500 MG tablet Take  1,000 mg by mouth every 6 (six) hours as needed for moderate pain.   Yes [provider]  amLODipine (NORVASC) 5 MG tablet Take 5 mg by mouth daily.   Yes [provider]  aspirin 81 MG tablet Take 81 mg by mouth daily.     Yes [provider]  bismuth subsalicylate (PEPTO BISMOL) 262 MG/15ML suspension Take 30 mLs by mouth every 6 (six) hours as needed for indigestion.    Yes [provider]  clidinium-chlordiazePOXIDE (LIBRAX) 5-2.5 MG capsule Take 1 capsule by mouth 3 (three) times daily before meals. 04/10/16  Yes Esterwood, Amy S, PA-C  dicyclomine (BENTYL) 10 MG capsule TAKE ONE CAPSULE TWICE A DAY.  Patient needs office visit for further refills 09/03/19  Yes Irene Shipper, MD  sertraline (ZOLOFT) 50 MG tablet Take 1 tablet (50 mg total) by mouth daily. 09/12/18  Yes Irene Shipper, MD   tamsulosin (FLOMAX) 0.4 MG CAPS capsule Take 1 capsule by mouth daily. 10/13/15  Yes [provider]     Positive ROS: Otherwise negative  All other systems have been reviewed and were otherwise negative with the exception of those mentioned in the HPI and as above.  Physical Exam: Constitutional: Alert, well-appearing, no acute distress Ears: External ears without lesions or tenderness. Ear canals are clear bilaterally.  Right TM is clear.  Left TM is clear with a healing perforation site posteriorly inferiorly but no drainage from the perforation which is healing and is now pinpoint in size.  I was able insufflate air through his nose and eustachian tubes.  On hearing screening with the 512 1024 tuning fork AC was greater than BC bilaterally but he had moderate subjective hearing loss in both ears with a 1024 tuning fork. Nasal: External nose without lesions. Septum with minimal deformity and mild rhinitis.. Clear nasal passages otherwise. Oral: Lips and gums without lesions. Tongue and palate mucosa without lesions. Posterior oropharynx clear. Neck: No palpable adenopathy or masses Respiratory: Breathing comfortably  Skin: No facial/neck lesions or rash noted.  Procedures  Assessment: Sensorineural hearing loss with presently no signs of recurrent serous otitis. Myringotomy has not quite healed but should be healed in the next couple of days.  Plan: Recommended continue use of the nasal steroid spray. He should function better after using his hearing aids and will follow up on a as needed basis.   Radene Journey, MD

## 2019-12-03 DIAGNOSIS — Z5321 Procedure and treatment not carried out due to patient leaving prior to being seen by health care provider: Secondary | ICD-10-CM | POA: Diagnosis not present

## 2019-12-03 DIAGNOSIS — K5900 Constipation, unspecified: Secondary | ICD-10-CM | POA: Diagnosis not present

## 2019-12-05 DIAGNOSIS — R300 Dysuria: Secondary | ICD-10-CM | POA: Diagnosis not present

## 2019-12-05 DIAGNOSIS — R3914 Feeling of incomplete bladder emptying: Secondary | ICD-10-CM | POA: Diagnosis not present

## 2019-12-05 DIAGNOSIS — N139 Obstructive and reflux uropathy, unspecified: Secondary | ICD-10-CM | POA: Diagnosis not present

## 2019-12-11 DIAGNOSIS — R3914 Feeling of incomplete bladder emptying: Secondary | ICD-10-CM | POA: Diagnosis not present

## 2019-12-15 ENCOUNTER — Telehealth: Payer: Self-pay

## 2019-12-15 DIAGNOSIS — R339 Retention of urine, unspecified: Secondary | ICD-10-CM | POA: Diagnosis not present

## 2019-12-15 DIAGNOSIS — I10 Essential (primary) hypertension: Secondary | ICD-10-CM | POA: Diagnosis not present

## 2019-12-15 DIAGNOSIS — E039 Hypothyroidism, unspecified: Secondary | ICD-10-CM | POA: Diagnosis not present

## 2019-12-15 DIAGNOSIS — R5383 Other fatigue: Secondary | ICD-10-CM | POA: Diagnosis not present

## 2019-12-15 NOTE — Telephone Encounter (Signed)
-----   Message from Irene Shipper, MD sent at 12/15/2019  4:27 PM EDT ----- Regarding: Please call him [name],Mr. Marvin Jones (my patient, a friend and a good friend of Dr. Sharlett Iles), called my home last week and left a message that he wasn't feeling well.  I called him back on his cell phone and home phone number and left messages.  He did not call me back.  However, he left a handwritten note at the front desk today.  He has irritable bowel.  He lost his wife to cancer last year.Please call the patient to see exactly what problems he is having and we can go from there.  The number that he left on the handwritten note is [phone].  Thank youJP

## 2019-12-15 NOTE — Telephone Encounter (Signed)
Left message for pt to call back  °

## 2019-12-16 MED ORDER — DICYCLOMINE HCL 10 MG PO CAPS: 10.0000 mg | ORAL_CAPSULE | Freq: Two times a day (BID) | ORAL | 1 refills | Status: DC

## 2019-12-16 MED ORDER — SERTRALINE HCL 50 MG PO TABS: 50.0000 mg | ORAL_TABLET | Freq: Every day | ORAL | 1 refills | Status: DC

## 2019-12-16 NOTE — Telephone Encounter (Signed)
Spoke with pt and he is aware. He had tapered off of the zoloft but will try this again. Scripts sent to pharmacy. Pt scheduled to see Dr. Henrene Pastor 02/11/20@10am . Pt aware.

## 2019-12-16 NOTE — Telephone Encounter (Signed)
Thank you Vaughan Basta. 1.  Okay to refill dicyclomine 2.  I did have him on Zoloft in the past.  Is he still on Zoloft?  If he would like to resume Zoloft 50 mg at night, please refill.  This has helped his irritable bowel. 3.  Get him a future office follow-up with me by the end of the year. JP

## 2019-12-16 NOTE — Telephone Encounter (Signed)
Spoke with pt and he states he was having a bad week last week with his IBS. Reports he takes dicyclomine BID and pepto bismol when he has these symptoms. Pt states his last refill for dicyclomine stated he needed an OV for future refills. Pt also wanted to know if he is really having problems is it ok for him to take 2 dicyclomine caps if he needs to. He is requesting some refills and just wanted to discuss foods he should and should not eat. He is taking metamucil in the am and he feels this has helped. He wanted to let you know he is thinking about you and your kids esp Elta [place] with medical school and that he misses seeing you at the gym. Ok to refill?

## 2019-12-18 DIAGNOSIS — K5730 Diverticulosis of large intestine without perforation or abscess without bleeding: Secondary | ICD-10-CM | POA: Diagnosis not present

## 2019-12-18 DIAGNOSIS — Z23 Encounter for immunization: Secondary | ICD-10-CM | POA: Diagnosis not present

## 2019-12-18 DIAGNOSIS — N390 Urinary tract infection, site not specified: Secondary | ICD-10-CM | POA: Diagnosis not present

## 2019-12-18 DIAGNOSIS — R8281 Pyuria: Secondary | ICD-10-CM | POA: Diagnosis not present

## 2019-12-30 DIAGNOSIS — R3914 Feeling of incomplete bladder emptying: Secondary | ICD-10-CM | POA: Diagnosis not present

## 2020-01-01 DIAGNOSIS — R3914 Feeling of incomplete bladder emptying: Secondary | ICD-10-CM | POA: Diagnosis not present

## 2020-01-02 DIAGNOSIS — R338 Other retention of urine: Secondary | ICD-10-CM | POA: Diagnosis not present

## 2020-01-02 DIAGNOSIS — R339 Retention of urine, unspecified: Secondary | ICD-10-CM | POA: Diagnosis not present

## 2020-01-05 DIAGNOSIS — Z79899 Other long term (current) drug therapy: Secondary | ICD-10-CM | POA: Diagnosis not present

## 2020-01-05 DIAGNOSIS — K580 Irritable bowel syndrome with diarrhea: Secondary | ICD-10-CM | POA: Diagnosis not present

## 2020-01-05 DIAGNOSIS — R338 Other retention of urine: Secondary | ICD-10-CM | POA: Diagnosis not present

## 2020-01-05 DIAGNOSIS — E039 Hypothyroidism, unspecified: Secondary | ICD-10-CM | POA: Diagnosis not present

## 2020-01-05 DIAGNOSIS — Z7982 Long term (current) use of aspirin: Secondary | ICD-10-CM | POA: Diagnosis not present

## 2020-01-05 DIAGNOSIS — Z0000 Encounter for general adult medical examination without abnormal findings: Secondary | ICD-10-CM | POA: Diagnosis not present

## 2020-01-06 DIAGNOSIS — R3914 Feeling of incomplete bladder emptying: Secondary | ICD-10-CM | POA: Diagnosis not present

## 2020-01-07 DIAGNOSIS — R5383 Other fatigue: Secondary | ICD-10-CM | POA: Diagnosis not present

## 2020-01-07 DIAGNOSIS — R0600 Dyspnea, unspecified: Secondary | ICD-10-CM | POA: Diagnosis not present

## 2020-01-08 DIAGNOSIS — R3914 Feeling of incomplete bladder emptying: Secondary | ICD-10-CM | POA: Diagnosis not present

## 2020-01-12 ENCOUNTER — Other Ambulatory Visit: Payer: Self-pay | Admitting: Internal Medicine

## 2020-01-12 ENCOUNTER — Other Ambulatory Visit: Payer: Self-pay

## 2020-01-12 ENCOUNTER — Emergency Department (HOSPITAL_COMMUNITY)
Admission: EM | Admit: 2020-01-12 | Discharge: 2020-01-12 | Disposition: A | Discharge status: Home / Self Care | Payer: Medicare Other | Attending: Emergency Medicine | Admitting: Emergency Medicine

## 2020-01-12 DIAGNOSIS — Z85828 Personal history of other malignant neoplasm of skin: Secondary | ICD-10-CM | POA: Insufficient documentation

## 2020-01-12 DIAGNOSIS — Z79899 Other long term (current) drug therapy: Secondary | ICD-10-CM | POA: Insufficient documentation

## 2020-01-12 DIAGNOSIS — R634 Abnormal weight loss: Secondary | ICD-10-CM

## 2020-01-12 DIAGNOSIS — R1030 Lower abdominal pain, unspecified: Secondary | ICD-10-CM | POA: Diagnosis not present

## 2020-01-12 DIAGNOSIS — Z87891 Personal history of nicotine dependence: Secondary | ICD-10-CM | POA: Diagnosis not present

## 2020-01-12 DIAGNOSIS — Z7982 Long term (current) use of aspirin: Secondary | ICD-10-CM | POA: Insufficient documentation

## 2020-01-12 DIAGNOSIS — R531 Weakness: Secondary | ICD-10-CM | POA: Insufficient documentation

## 2020-01-12 DIAGNOSIS — I10 Essential (primary) hypertension: Secondary | ICD-10-CM | POA: Insufficient documentation

## 2020-01-12 DIAGNOSIS — Z743 Need for continuous supervision: Secondary | ICD-10-CM | POA: Diagnosis not present

## 2020-01-12 DIAGNOSIS — Z20822 Contact with and (suspected) exposure to covid-19: Secondary | ICD-10-CM | POA: Insufficient documentation

## 2020-01-12 DIAGNOSIS — R109 Unspecified abdominal pain: Secondary | ICD-10-CM | POA: Diagnosis not present

## 2020-01-12 LAB — COMPREHENSIVE METABOLIC PANEL
ALT: 28 U/L (ref 0–44)
AST: 35 U/L (ref 15–41)
Albumin: 3.9 g/dL (ref 3.5–5.0)
Alkaline Phosphatase: 56 U/L (ref 38–126)
Anion gap: 10 (ref 5–15)
BUN: 22 mg/dL (ref 8–23)
CO2: 22 mmol/L (ref 22–32)
Calcium: 8.6 mg/dL — ABNORMAL LOW (ref 8.9–10.3)
Chloride: 102 mmol/L (ref 98–111)
Creatinine, Ser: 0.95 mg/dL (ref 0.61–1.24)
GFR, Estimated: >60 mL/min (ref >60)
Glucose, Bld: 98 mg/dL (ref 70–99)
Potassium: 4.3 mmol/L (ref 3.5–5.1)
Sodium: 134 mmol/L — ABNORMAL LOW (ref 135–145)
Total Bilirubin: 0.6 mg/dL (ref 0.3–1.2)
Total Protein: 6.6 g/dL (ref 6.5–8.1)

## 2020-01-12 LAB — CBC
HCT: 42.6 % (ref 39.0–52.0)
Hemoglobin: 14.5 g/dL (ref 13.0–17.0)
MCH: 31.2 pg (ref 26.0–34.0)
MCHC: 34 g/dL (ref 30.0–36.0)
MCV: 91.6 fL (ref 80.0–100.0)
Platelets: 253 10*3/uL (ref 150–400)
RBC: 4.65 MIL/uL (ref 4.22–5.81)
RDW: 12.7 % (ref 11.5–15.5)
WBC: 9.2 10*3/uL (ref 4.0–10.5)
nRBC: 0 % (ref 0.0–0.2)

## 2020-01-12 LAB — URINALYSIS, ROUTINE W REFLEX MICROSCOPIC
Bilirubin Urine: NEGATIVE
Glucose, UA: NEGATIVE mg/dL
Hgb urine dipstick: NEGATIVE
Ketones, ur: NEGATIVE mg/dL
Leukocytes,Ua: NEGATIVE
Nitrite: NEGATIVE
Protein, ur: NEGATIVE mg/dL
Specific Gravity, Urine: 1.013 (ref 1.005–1.030)
pH: 7 (ref 5.0–8.0)

## 2020-01-12 LAB — RESP PANEL BY RT-PCR (FLU A&B, COVID) ARPGX2
Influenza A by PCR: NEGATIVE
Influenza B by PCR: NEGATIVE
SARS Coronavirus 2 by RT PCR: NEGATIVE

## 2020-01-12 LAB — TROPONIN I (HIGH SENSITIVITY): Troponin I (High Sensitivity): 7 ng/L (ref <18)

## 2020-01-12 LAB — TSH: TSH: 1.79 u[IU]/mL (ref 0.350–4.500)

## 2020-01-12 MED ORDER — ACETAMINOPHEN 325 MG PO TABS
650.0000 mg | ORAL_TABLET | Freq: Once | ORAL | Status: AC
  Administered 2020-01-12: 650 mg via ORAL
  Filled 2020-01-12: qty 2

## 2020-01-12 NOTE — ED Provider Notes (Signed)
[place] DEPT Provider Note   CSN: [id number] Arrival date & time: 01/12/20  1307     History Chief Complaint  Patient presents with  . Weakness    Marvin Jones. is a 84 y.o. male.  Patient c/o feeling generally weak for the past 2-3 weeks.  Symptoms gradual onset, moderate, constant, persistent, without acute or abrupt change in past 1-2 days. States saw pcp for same and was told basic labs looked fine. Denies focal or unilateral numbness/weakness. No change in speech or vision. No loss of normal functional ability. No change in meds or new meds. No fever or chills. No headache. No neck pain or stiffness. No chest pain or discomfort. No sob. Mild doe. No cough or uri symptoms. Had covid booster yesterday. Denies abd pain or nvd. No dysuria or gu c/o. No rash/skin lesions.   The history is provided by the patient and the EMS personnel.  Weakness Associated symptoms: no abdominal pain, no chest pain, no cough, no dysuria, no fever, no headaches, no shortness of breath and no vomiting        Past Medical History:  Diagnosis Date  . Anxiety and depression    h/o  . Barrett's esophagus   . Diaphragmatic hernia without mention of obstruction or gangrene   . Diverticulosis of colon (without mention of hemorrhage)   . Esophageal reflux   . HTN (hypertension)   . Hyperlipidemia   . Hypertrophy of prostate with urinary obstruction and other lower urinary tract symptoms (LUTS)   . Irritable bowel syndrome   . Personal history of colonic polyps   . Skin cancer    sees Dr Ronnald Ramp   . Stricture and stenosis of esophagus     Patient Active Problem List   Diagnosis Date Noted  . DOE (dyspnea on exertion) 03/06/2018  . PVC's (premature ventricular contractions) 03/06/2018  . Essential hypertension 03/06/2018  . E. coli bacteremia 10/17/2015  . Catheter-associated urinary tract infection ([place]) 10/14/2015  . Sepsis secondary to UTI ([place]) 10/14/2015   . PCP NOTES >>>>>>>>>>>> 11/19/2014  . Anxiety and depression 03/09/2014  . Annual physical exam 03/09/2014  . Toe avulsion 08/06/2011  . BARRETTS ESOPHAGUS 03/04/2010  . IRRITABLE BOWEL SYNDROME 03/04/2010  . ESOPHAGEAL STRICTURE 12/16/2009  . HIATAL HERNIA WITH REFLUX 12/16/2009  . DIVERTICULOSIS, COLON 12/16/2009  . COLONIC POLYPS, ADENOMATOUS, HX OF 12/16/2009  . Hyperlipidemia 11/23/2005  . GERD 11/23/2005  . BPH (benign prostatic hyperplasia) 11/23/2005    Past Surgical History:  Procedure Laterality Date  . TONSILLECTOMY         Family History  Problem Relation Age of Onset  . Heart attack Father 83  . Diabetes Son   . Colon cancer Neg Hx   . Prostate cancer Neg Hx   . Hypertension Neg Hx     Social History   Tobacco Use  . Smoking status: Former Smoker    Types: Cigars  . Smokeless tobacco: Never Used  . Tobacco comment: cigars, quit ~ 2005  Vaping Use  . Vaping Use: Never used  Substance Use Topics  . Alcohol use: Yes    Alcohol/week: 0.0 standard drinks    Comment: 3 drinks per week  . Drug use: No    Home Medications Prior to Admission medications   Medication Sig Start Date End Date Taking? Authorizing Provider  acetaminophen (TYLENOL) 500 MG tablet Take 1,000 mg by mouth every 6 (six) hours as needed for moderate pain.  [provider]  amLODipine (NORVASC) 5 MG tablet Take 5 mg by mouth daily.    [provider]  aspirin 81 MG tablet Take 81 mg by mouth daily.      [provider]  bismuth subsalicylate (PEPTO BISMOL) 262 MG/15ML suspension Take 30 mLs by mouth every 6 (six) hours as needed for indigestion.     [provider]  clidinium-chlordiazePOXIDE (LIBRAX) 5-2.5 MG capsule Take 1 capsule by mouth 3 (three) times daily before meals. 04/10/16   Esterwood, Amy S, PA-C  dicyclomine (BENTYL) 10 MG capsule TAKE ONE CAPSULE TWICE A DAY.  Patient needs office visit for further refills 09/03/19   Irene Shipper,  MD  dicyclomine (BENTYL) 10 MG capsule Take 1 capsule (10 mg total) by mouth in the morning and at bedtime. 12/16/19   Irene Shipper, MD  sertraline (ZOLOFT) 50 MG tablet Take 1 tablet (50 mg total) by mouth at bedtime. 12/16/19   Irene Shipper, MD  tamsulosin (FLOMAX) 0.4 MG CAPS capsule Take 1 capsule by mouth daily. 10/13/15   [provider]    Allergies    Patient has no known allergies.  Review of Systems   Review of Systems  Constitutional: Negative for fever.  HENT: Negative for sore throat.   Eyes: Negative for visual disturbance.  Respiratory: Negative for cough and shortness of breath.   Cardiovascular: Negative for chest pain.  Gastrointestinal: Negative for abdominal pain and vomiting.  Endocrine: Negative for polyuria.  Genitourinary: Negative for dysuria and flank pain.  Musculoskeletal: Negative for back pain and neck pain.  Skin: Negative for rash.  Neurological: Positive for weakness. Negative for speech difficulty, numbness and headaches.  Hematological: Does not bruise/bleed easily.  Psychiatric/Behavioral: Negative for confusion.    Physical Exam Updated Vital Signs BP (!) 174/84 (BP Location: Right Arm)   Pulse 84   Temp 98.6 F (37 C) (Oral)   Resp 18   SpO2 100%   Physical Exam Vitals and nursing note reviewed.  Constitutional:      Appearance: Normal appearance. He is well-developed.  HENT:     Head: Atraumatic.     Nose: Nose normal.     Mouth/Throat:     Mouth: Mucous membranes are moist.     Pharynx: Oropharynx is clear.  Eyes:     General: No scleral icterus.    Conjunctiva/sclera: Conjunctivae normal.     Pupils: Pupils are equal, round, and reactive to light.  Neck:     Vascular: No carotid bruit.     Trachea: No tracheal deviation.  Cardiovascular:     Rate and Rhythm: Normal rate and regular rhythm.     Pulses: Normal pulses.     Heart sounds: Normal heart sounds. No murmur heard.  No friction rub. No gallop.     Pulmonary:     Effort: Pulmonary effort is normal. No accessory muscle usage or respiratory distress.     Breath sounds: Normal breath sounds.  Abdominal:     General: Bowel sounds are normal. There is no distension.     Palpations: Abdomen is soft.     Tenderness: There is no abdominal tenderness. There is no guarding.  Genitourinary:    Comments: No cva tenderness. Musculoskeletal:        General: No swelling or tenderness.     Cervical back: Normal range of motion and neck supple. No rigidity.     Right lower leg: No edema.     Left  lower leg: No edema.  Skin:    General: Skin is warm and dry.     Findings: No rash.  Neurological:     Mental Status: He is alert.     Cranial Nerves: No cranial nerve deficit.     Comments: Alert, speech clear. Motor/sens grossly intact bil. Steady gait.   Psychiatric:        Mood and Affect: Mood normal.     ED Results / Procedures / Treatments   Labs (all labs ordered are listed, but only abnormal results are displayed) Results for orders placed or performed during the hospital encounter of 01/12/20  Resp Panel by RT-PCR (Flu A&B, Covid) Nasopharyngeal Swab   Specimen: Nasopharyngeal Swab; Nasopharyngeal(NP) swabs in vial transport medium  Result Value Ref Range   SARS Coronavirus 2 by RT PCR NEGATIVE NEGATIVE   Influenza A by PCR NEGATIVE NEGATIVE   Influenza B by PCR NEGATIVE NEGATIVE  CBC  Result Value Ref Range   WBC 9.2 4.0 - 10.5 K/uL   RBC 4.65 4.22 - 5.81 MIL/uL   Hemoglobin 14.5 13.0 - 17.0 g/dL   HCT 42.6 39 - 52 %   MCV 91.6 80.0 - 100.0 fL   MCH 31.2 26.0 - 34.0 pg   MCHC 34.0 30.0 - 36.0 g/dL   RDW 12.7 11.5 - 15.5 %   Platelets 253 150 - 400 K/uL   nRBC 0.0 0.0 - 0.2 %  Comprehensive metabolic panel  Result Value Ref Range   Sodium 134 (L) 135 - 145 mmol/L   Potassium 4.3 3.5 - 5.1 mmol/L   Chloride 102 98 - 111 mmol/L   CO2 22 22 - 32 mmol/L   Glucose, Bld 98 70 - 99 mg/dL   BUN 22 8 - 23 mg/dL   Creatinine,  Ser 0.95 0.61 - 1.24 mg/dL   Calcium 8.6 (L) 8.9 - 10.3 mg/dL   Total Protein 6.6 6.5 - 8.1 g/dL   Albumin 3.9 3.5 - 5.0 g/dL   AST 35 15 - 41 U/L   ALT 28 0 - 44 U/L   Alkaline Phosphatase 56 38 - 126 U/L   Total Bilirubin 0.6 0.3 - 1.2 mg/dL   GFR, Estimated >60 >60 mL/min   Anion gap 10 5 - 15  Urinalysis, Routine w reflex microscopic Urine, Catheterized  Result Value Ref Range   Color, Urine YELLOW YELLOW   APPearance CLEAR CLEAR   Specific Gravity, Urine 1.013 1.005 - 1.030   pH 7.0 5.0 - 8.0   Glucose, UA NEGATIVE NEGATIVE mg/dL   Hgb urine dipstick NEGATIVE NEGATIVE   Bilirubin Urine NEGATIVE NEGATIVE   Ketones, ur NEGATIVE NEGATIVE mg/dL   Protein, ur NEGATIVE NEGATIVE mg/dL   Nitrite NEGATIVE NEGATIVE   Leukocytes,Ua NEGATIVE NEGATIVE  TSH  Result Value Ref Range   TSH 1.790 0.350 - 4.500 uIU/mL  Troponin I (High Sensitivity)  Result Value Ref Range   Troponin I (High Sensitivity) 7 <18 ng/L    EKG EKG Interpretation  Date/Time:  Monday January 12 2020 13:16:14 EST Ventricular Rate:  81 PR Interval:    QRS Duration: 88 QT Interval:  356 QTC Calculation: 414 R Axis:   -49 Text Interpretation: Sinus arrhythmia Left anterior fascicular block No significant change since last tracing Confirmed by Lajean Saver [phone]) on 01/12/2020 2:44:29 PM   Radiology No results found.  Procedures Procedures (including critical care time)  Medications Ordered in ED Medications - No data to display  ED Course  I  have reviewed the triage vital signs and the nursing notes.  Pertinent labs & imaging results that were available during my care of the patient were reviewed by me and considered in my medical decision making (see chart for details).    MDM Rules/Calculators/A&P                         Iv ns. Stat labs.   Reviewed nursing notes and prior charts for additional history.   Labs reviewed/interpreted by me - wbc normal, tsh normal, trop normal.  Po  fluids/food.   UA remains pending. Po fluids.  Recheck pt, alert, content - overall appears well, well hydrated, and non-toxic appearing.   Additional labs reviewed/interpreted by me - ua neg. covid neg.   Patient requests d/c to home.   Rec pcp f/u.  Return precautions provided.      Final Clinical Impression(s) / ED Diagnoses Final diagnoses:  None    Rx / DC Orders ED Discharge Orders    None       Lajean Saver, MD 01/12/20 1646

## 2020-01-12 NOTE — Discharge Instructions (Addendum)
It was our pleasure to provide your ER care today - we hope that you feel better.  Overall your lab tests look good.   Rest. Drink adequate fluids, get adequate nutrition.  Follow up with your doctor in the next 1-2 weeks.  Return to ER if worse, new symptoms, fevers, trouble breathing, or other emergency concern.

## 2020-01-12 NOTE — ED Triage Notes (Signed)
Pt arrived via EMS, from home, c/o generalized weakness x2-3 weeks. Denies any CP/SOB

## 2020-01-20 ENCOUNTER — Ambulatory Visit
Admission: RE | Admit: 2020-01-20 | Discharge: 2020-01-20 | Disposition: A | Discharge status: Home / Self Care | Payer: Medicare Other | Source: Ambulatory Visit | Attending: Internal Medicine | Admitting: Internal Medicine

## 2020-01-20 DIAGNOSIS — R5383 Other fatigue: Secondary | ICD-10-CM | POA: Diagnosis not present

## 2020-01-20 DIAGNOSIS — K5730 Diverticulosis of large intestine without perforation or abscess without bleeding: Secondary | ICD-10-CM | POA: Diagnosis not present

## 2020-01-20 DIAGNOSIS — R634 Abnormal weight loss: Secondary | ICD-10-CM

## 2020-01-20 DIAGNOSIS — M810 Age-related osteoporosis without current pathological fracture: Secondary | ICD-10-CM | POA: Diagnosis not present

## 2020-01-22 ENCOUNTER — Other Ambulatory Visit: Payer: Self-pay | Admitting: Urology

## 2020-01-23 DIAGNOSIS — Z789 Other specified health status: Secondary | ICD-10-CM | POA: Diagnosis not present

## 2020-01-23 DIAGNOSIS — Z01812 Encounter for preprocedural laboratory examination: Secondary | ICD-10-CM | POA: Diagnosis not present

## 2020-01-26 ENCOUNTER — Telehealth: Payer: Self-pay | Admitting: Internal Medicine

## 2020-01-26 NOTE — Telephone Encounter (Signed)
Pt scheduled to see Dr. Henrene Pastor 12/10@2pm , pt aware of appt.

## 2020-01-26 NOTE — Telephone Encounter (Signed)
Pt called and wanted to know if it was ok for him to add metamucil bid. Discussed with him that was fine. He also states he is taking the zoloft that was sent in for him. He is having an issue with a "slow burn below his belly button." States he dicyclomine helps but does not make it go away. He is also taking pepto as needed to help. Pt wanted to know if there is something else he can take. He is taking the dicyclomine in the morning and evening. Please advise.

## 2020-01-26 NOTE — Telephone Encounter (Signed)
I would like to see him in the office.  Have him come in this Friday at 2 PM.  Thanks

## 2020-01-28 DIAGNOSIS — R300 Dysuria: Secondary | ICD-10-CM | POA: Diagnosis not present

## 2020-01-28 DIAGNOSIS — R5383 Other fatigue: Secondary | ICD-10-CM | POA: Diagnosis not present

## 2020-01-30 ENCOUNTER — Ambulatory Visit (INDEPENDENT_AMBULATORY_CARE_PROVIDER_SITE_OTHER): Payer: Medicare Other | Admitting: Internal Medicine

## 2020-01-30 ENCOUNTER — Telehealth: Payer: Self-pay | Admitting: Internal Medicine

## 2020-01-30 DIAGNOSIS — F32A Depression, unspecified: Secondary | ICD-10-CM

## 2020-01-30 DIAGNOSIS — F419 Anxiety disorder, unspecified: Secondary | ICD-10-CM | POA: Diagnosis not present

## 2020-01-30 DIAGNOSIS — K2100 Gastro-esophageal reflux disease with esophagitis, without bleeding: Secondary | ICD-10-CM | POA: Diagnosis not present

## 2020-01-30 DIAGNOSIS — Z86010 Personal history of colonic polyps: Secondary | ICD-10-CM | POA: Diagnosis not present

## 2020-01-30 DIAGNOSIS — R109 Unspecified abdominal pain: Secondary | ICD-10-CM

## 2020-01-30 NOTE — Telephone Encounter (Signed)
Spoke with pt and let him know Dr. Henrene Pastor will call him between 2:30pm-3:30pm.

## 2020-01-30 NOTE — Telephone Encounter (Signed)
Patient wants to know if he can have a visit over the phone instead of coming in at 2:00pm States that he has a uti and no energy but really needs to talk to Dr Henrene Pastor [phone] or cell phone [phone]

## 2020-02-02 ENCOUNTER — Other Ambulatory Visit: Payer: Medicare Other

## 2020-02-02 DIAGNOSIS — H10413 Chronic giant papillary conjunctivitis, bilateral: Secondary | ICD-10-CM | POA: Diagnosis not present

## 2020-02-02 DIAGNOSIS — R339 Retention of urine, unspecified: Secondary | ICD-10-CM | POA: Diagnosis not present

## 2020-02-04 ENCOUNTER — Encounter: Payer: Self-pay | Admitting: Internal Medicine

## 2020-02-04 ENCOUNTER — Other Ambulatory Visit: Payer: Self-pay

## 2020-02-04 MED ORDER — SERTRALINE HCL 50 MG PO TABS: 75.0000 mg | ORAL_TABLET | Freq: Every day | ORAL | 3 refills | Status: DC

## 2020-02-04 NOTE — Progress Notes (Signed)
HISTORY OF PRESENT ILLNESS:  Marvin Jones. is a 84 y.o. male with BPH, chronic functional abdominal pain, health related anxiety, GERD with esophagitis, and adenomatous colon polyps.  He request to be seen.  Telehealth medicine during the epidemic.  Patient was last seen in this office November 2014 by myself and January 2018 by the GI physician assistant.  Patient lost his wife to ovarian cancer August 2020.  He has had a difficult time since.  His chief complaint today is chronic burning sensation in the lower abdomen.  This is typical of his abdominal complaint.  Also bloating.  No active reflux symptoms.  He does state that Pepto-Bismol seems to help.  In the past we have treated him with SSRI therapy with good results.  He stopped his medication at some point stating that he was feeling better.  He was recently reinstituted on Zoloft 50 mg at night.  He does take dicyclomine as needed, which helps some.  Also takes Pepto-Bismol which helps some.  He reports deformities.  No bleeding.  His last complete colonoscopy was performed June 2013.  He was found to have a tubular adenoma.  Otherwise normal.  His last upper endoscopy performed at that same time revealed esophagitis.  Does not appear he is currently taking.  Because of his abdominal complaints his PCP ordered blood work January 12, 2020.  Unremarkable comprehensive metabolic panel including normal liver test.  Unremarkable CBC with normal hemoglobin 14.5.  Normal TSH.  A contrast-enhanced CT scan of the abdomen and pelvis was performed January 20, 2020.  No acute abnormalities.  Incidental findings included prostamegaly, aortic atherosclerosis, and L5 pars defect bilaterally.  REVIEW OF SYSTEMS:  All non-GI ROS negative unless otherwise stated in the HPI except for anxiety  Past Medical History:  Diagnosis Date  . Anxiety and depression    h/o  . Barrett's esophagus   . Diaphragmatic hernia without mention of obstruction or gangrene    . Diverticulosis of colon (without mention of hemorrhage)   . Esophageal reflux   . HTN (hypertension)   . Hyperlipidemia   . Hypertrophy of prostate with urinary obstruction and other lower urinary tract symptoms (LUTS)   . Irritable bowel syndrome   . Personal history of colonic polyps   . Skin cancer    sees Dr Ronnald Ramp   . Stricture and stenosis of esophagus     Past Surgical History:  Procedure Laterality Date  . TONSILLECTOMY      Social History Marvin Jones.  reports that he has quit smoking. His smoking use included cigars. He has never used smokeless tobacco. He reports current alcohol use. He reports that he does not use drugs.  family history includes Diabetes in his son; Heart attack (age of onset: 53) in his father.  No Known Allergies     PHYSICAL EXAMINATION: Vital signs: There were no vitals taken for this visit.  Psychiatric: alert and oriented x3, cooperative No physical examination due to telehealth visit  ASSESSMENT:  1.  Chronic functional abdominal pain 2.  Health related anxiety 3.  Anxiety/depression 4.  GERD with history of erosive esophagitis.  Last EGD 2014 5.  History of multiple adenomatous colon polyps.  Last colonoscopy 2014 6.  BPH.  Anticipating TURP March 05, 2020  PLAN:  1.  Increase Zoloft to 75 mg at night.  Prescribed.  Medication risks reviewed 2.  Dicyclomine as needed.  Prescribed.  Medication risks reviewed 3.  Pepto-Bismol as needed  4.  TURP January 2022, planned 5.  GI follow-up thereafter Total time of 45 minutes was spent preparing to see the patient, reviewing outside tests and x-rays.  Obtaining comprehensive history, counseling the patient regarding his above listed issues.  Prescribing medications.  Documenting clinical information in the health record.

## 2020-02-04 NOTE — Progress Notes (Signed)
New prescription for zoloft 75mg  sent to [facility] as requested by Dr. Henrene Pastor.

## 2020-02-09 DIAGNOSIS — R300 Dysuria: Secondary | ICD-10-CM | POA: Diagnosis not present

## 2020-02-11 ENCOUNTER — Ambulatory Visit: Payer: Medicare Other | Admitting: Internal Medicine

## 2020-02-11 DIAGNOSIS — R338 Other retention of urine: Secondary | ICD-10-CM | POA: Diagnosis not present

## 2020-02-12 DIAGNOSIS — R5383 Other fatigue: Secondary | ICD-10-CM | POA: Diagnosis not present

## 2020-02-12 DIAGNOSIS — Z79899 Other long term (current) drug therapy: Secondary | ICD-10-CM | POA: Diagnosis not present

## 2020-02-12 DIAGNOSIS — N3000 Acute cystitis without hematuria: Secondary | ICD-10-CM | POA: Diagnosis not present

## 2020-02-16 DIAGNOSIS — R300 Dysuria: Secondary | ICD-10-CM | POA: Diagnosis not present

## 2020-02-19 DIAGNOSIS — R300 Dysuria: Secondary | ICD-10-CM | POA: Diagnosis not present

## 2020-02-23 DIAGNOSIS — H0100A Unspecified blepharitis right eye, upper and lower eyelids: Secondary | ICD-10-CM | POA: Diagnosis not present

## 2020-02-24 NOTE — Patient Instructions (Addendum)
DUE TO COVID-19 ONLY ONE VISITOR IS ALLOWED TO COME WITH YOU AND STAY IN THE WAITING ROOM ONLY DURING PRE OP AND PROCEDURE DAY OF SURGERY. THE 1 VISITOR  MAY VISIT WITH YOU AFTER SURGERY IN YOUR PRIVATE ROOM DURING VISITING HOURS ONLY!  YOU NEED TO HAVE A COVID 19 TEST ON: 03/02/20 @ 2:50 PM, THIS TEST MUST BE DONE BEFORE SURGERY,  COVID TESTING SITE [facility] [place] [address], IT IS ON THE RIGHT GOING OUT [address] APPROXIMATELY  2 MINUTES PAST [employer] ON THE RIGHT. ONCE YOUR COVID TEST IS COMPLETED,  PLEASE BEGIN THE QUARANTINE INSTRUCTIONS AS OUTLINED IN YOUR HANDOUT.                Marvin Jones.    Your procedure is scheduled on: 03/05/20   Report to [facility] Main  Entrance   Report to admitting at: 8:30 AM     Call this number if you have problems the morning of surgery [phone]    Remember: Do not eat solid food :After Midnight. Clear liquids until: 7:30 am  CLEAR LIQUID DIET   Foods Allowed                                                                     Foods Excluded  Coffee and tea, regular and decaf                             liquids that you cannot  Plain Jell-O any favor except red or purple                                           see through such as: Fruit ices (not with fruit pulp)                                     milk, soups, orange juice  Iced Popsicles                                    All solid food Carbonated beverages, regular and diet                                    Cranberry, grape and apple juices Sports drinks like Gatorade Lightly seasoned clear broth or consume(fat free) Sugar, honey syrup  Sample Menu Breakfast                                Lunch                                     Supper Cranberry juice  Beef broth                            Chicken broth Jell-O                                     Grape juice                           Apple juice Coffee or tea                         Jell-O                                      Popsicle                                                Coffee or tea                        Coffee or tea  _____________________________________________________________________  BRUSH YOUR TEETH MORNING OF SURGERY AND RINSE YOUR MOUTH OUT, NO CHEWING GUM CANDY OR MINTS.    Take these medicines the morning of surgery with A SIP OF WATER: sertraline,amlodipine,amoxicillin,levothyroxine,tamsulosin. Alprazolam and eye drops as usual.                               You may not have any metal on your body including hair pins and              piercings  Do not wear jewelry, lotions, powders or perfumes, deodorant             Men may shave face and neck.   Do not bring valuables to the hospital. [facility] IS NOT             RESPONSIBLE   FOR VALUABLES.  Contacts, dentures or bridgework may not be worn into surgery.  Leave suitcase in the car. After surgery it may be brought to your room.     Patients discharged the day of surgery will not be allowed to drive home. IF YOU ARE HAVING SURGERY AND GOING HOME THE SAME DAY, YOU MUST HAVE AN ADULT TO DRIVE YOU HOME AND BE WITH YOU FOR 24 HOURS. YOU MAY GO HOME BY TAXI OR UBER OR ORTHERWISE, BUT AN ADULT MUST ACCOMPANY YOU HOME AND STAY WITH YOU FOR 24 HOURS.  Name and phone number of your driver:  Special Instructions: N/A              Please read over the following fact sheets you were given: _____________________________________________________________________          [facility] - Preparing for Surgery Before surgery, you can play an important role.  Because skin is not sterile, your skin needs to be as free of germs as possible.  You can reduce the number of germs on your skin by washing with CHG (chlorahexidine gluconate) soap before surgery.  CHG is an antiseptic cleaner which kills germs and bonds with the  skin to continue killing germs even after washing. Please DO NOT use if you  have an allergy to CHG or antibacterial soaps.  If your skin becomes reddened/irritated stop using the CHG and inform your nurse when you arrive at Short Stay. Do not shave (including legs and underarms) for at least 48 hours prior to the first CHG shower.  You may shave your face/neck. Please follow these instructions carefully:  1.  Shower with CHG Soap the night before surgery and the  morning of Surgery.  2.  If you choose to wash your hair, wash your hair first as usual with your  normal  shampoo.  3.  After you shampoo, rinse your hair and body thoroughly to remove the  shampoo.                           4.  Use CHG as you would any other liquid soap.  You can apply chg directly  to the skin and wash                       Gently with a scrungie or clean washcloth.  5.  Apply the CHG Soap to your body ONLY FROM THE NECK DOWN.   Do not use on face/ open                           Wound or open sores. Avoid contact with eyes, ears mouth and genitals (private parts).                       Wash face,  Genitals (private parts) with your normal soap.             6.  Wash thoroughly, paying special attention to the area where your surgery  will be performed.  7.  Thoroughly rinse your body with warm water from the neck down.  8.  DO NOT shower/wash with your normal soap after using and rinsing off  the CHG Soap.                9.  Pat yourself dry with a clean towel.            10.  Wear clean pajamas.            11.  Place clean sheets on your bed the night of your first shower and do not  sleep with pets. Day of Surgery : Do not apply any lotions/deodorants the morning of surgery.  Please wear clean clothes to the hospital/surgery center.  FAILURE TO FOLLOW THESE INSTRUCTIONS MAY RESULT IN THE CANCELLATION OF YOUR SURGERY PATIENT SIGNATURE_________________________________  NURSE  SIGNATURE__________________________________  ________________________________________________________________________

## 2020-02-25 ENCOUNTER — Encounter (HOSPITAL_COMMUNITY)
Admission: RE | Admit: 2020-02-25 | Discharge: 2020-02-25 | Disposition: A | Discharge status: Home / Self Care | Payer: Medicare Other | Source: Ambulatory Visit | Attending: Urology | Admitting: Urology

## 2020-02-25 ENCOUNTER — Other Ambulatory Visit: Payer: Self-pay

## 2020-02-25 ENCOUNTER — Encounter (HOSPITAL_COMMUNITY): Payer: Self-pay

## 2020-02-25 DIAGNOSIS — R339 Retention of urine, unspecified: Secondary | ICD-10-CM | POA: Diagnosis not present

## 2020-02-25 DIAGNOSIS — Z01812 Encounter for preprocedural laboratory examination: Secondary | ICD-10-CM | POA: Insufficient documentation

## 2020-02-25 DIAGNOSIS — N3000 Acute cystitis without hematuria: Secondary | ICD-10-CM | POA: Diagnosis not present

## 2020-02-25 HISTORY — DX: Unspecified osteoarthritis, unspecified site: M19.90

## 2020-02-25 LAB — BASIC METABOLIC PANEL
Anion gap: 7 (ref 5–15)
BUN: 26 mg/dL — ABNORMAL HIGH (ref 8–23)
CO2: 27 mmol/L (ref 22–32)
Calcium: 9.2 mg/dL (ref 8.9–10.3)
Chloride: 104 mmol/L (ref 98–111)
Creatinine, Ser: 0.88 mg/dL (ref 0.61–1.24)
GFR, Estimated: >60 mL/min (ref >60)
Glucose, Bld: 89 mg/dL (ref 70–99)
Potassium: 4.5 mmol/L (ref 3.5–5.1)
Sodium: 138 mmol/L (ref 135–145)

## 2020-02-25 LAB — CBC
HCT: 45.1 % (ref 39.0–52.0)
Hemoglobin: 14.9 g/dL (ref 13.0–17.0)
MCH: 31.8 pg (ref 26.0–34.0)
MCHC: 33 g/dL (ref 30.0–36.0)
MCV: 96.2 fL (ref 80.0–100.0)
Platelets: 252 10*3/uL (ref 150–400)
RBC: 4.69 MIL/uL (ref 4.22–5.81)
RDW: 13.5 % (ref 11.5–15.5)
WBC: 8.8 10*3/uL (ref 4.0–10.5)
nRBC: 0 % (ref 0.0–0.2)

## 2020-02-25 NOTE — Progress Notes (Addendum)
COVID Vaccine Completed: Yes Date COVID Vaccine completed: 01/11/20 [place] COVID vaccine manufacturer:   Moderna     PCP - Dr. Merri Brunette. LOV: 01/30/20 Cardiologist - Dr. Rollene Rotunda.  Chest x-ray -  EKG - 01/12/20 Stress Test -  ECHO -  Cardiac Cath -  Pacemaker/ICD device last checked:  Sleep Study -  CPAP -   Fasting Blood Sugar -  Checks Blood Sugar _____ times a day  Blood Thinner Instructions: Aspirin Instructions: Last Dose:  Anesthesia review: Hx: HTN  Patient denies shortness of breath, fever, cough and chest pain at PAT appointment   Patient verbalized understanding of instructions that were given to them at the PAT appointment. Patient was also instructed that they will need to review over the PAT instructions again at home before surgery.

## 2020-03-02 ENCOUNTER — Other Ambulatory Visit (HOSPITAL_COMMUNITY)
Admission: RE | Admit: 2020-03-02 | Discharge: 2020-03-02 | Disposition: A | Discharge status: Home / Self Care | Payer: Medicare Other | Source: Ambulatory Visit | Attending: Urology | Admitting: Urology

## 2020-03-02 DIAGNOSIS — Z01812 Encounter for preprocedural laboratory examination: Secondary | ICD-10-CM | POA: Insufficient documentation

## 2020-03-02 DIAGNOSIS — Z20822 Contact with and (suspected) exposure to covid-19: Secondary | ICD-10-CM | POA: Diagnosis not present

## 2020-03-02 LAB — SARS CORONAVIRUS 2 (TAT 6-24 HRS): SARS Coronavirus 2: NEGATIVE

## 2020-03-05 ENCOUNTER — Ambulatory Visit (HOSPITAL_COMMUNITY): Payer: Medicare Other | Admitting: Certified Registered"

## 2020-03-05 ENCOUNTER — Other Ambulatory Visit: Payer: Self-pay

## 2020-03-05 ENCOUNTER — Encounter (HOSPITAL_COMMUNITY): Payer: Self-pay | Admitting: Urology

## 2020-03-05 ENCOUNTER — Ambulatory Visit (HOSPITAL_COMMUNITY)
Admission: RE | Admit: 2020-03-05 | Discharge: 2020-03-06 | Disposition: A | Discharge status: Home / Self Care | Payer: Medicare Other | Attending: Urology | Admitting: Urology

## 2020-03-05 ENCOUNTER — Encounter (HOSPITAL_COMMUNITY)
Admission: RE | Disposition: A | Discharge status: Home / Self Care | Payer: Self-pay | Source: Home / Self Care | Attending: Urology

## 2020-03-05 DIAGNOSIS — Z7982 Long term (current) use of aspirin: Secondary | ICD-10-CM | POA: Insufficient documentation

## 2020-03-05 DIAGNOSIS — R338 Other retention of urine: Secondary | ICD-10-CM | POA: Diagnosis not present

## 2020-03-05 DIAGNOSIS — Z87891 Personal history of nicotine dependence: Secondary | ICD-10-CM | POA: Insufficient documentation

## 2020-03-05 DIAGNOSIS — C61 Malignant neoplasm of prostate: Secondary | ICD-10-CM | POA: Diagnosis not present

## 2020-03-05 DIAGNOSIS — N3000 Acute cystitis without hematuria: Secondary | ICD-10-CM | POA: Insufficient documentation

## 2020-03-05 DIAGNOSIS — N401 Enlarged prostate with lower urinary tract symptoms: Secondary | ICD-10-CM | POA: Diagnosis not present

## 2020-03-05 DIAGNOSIS — Z79899 Other long term (current) drug therapy: Secondary | ICD-10-CM | POA: Diagnosis not present

## 2020-03-05 DIAGNOSIS — N320 Bladder-neck obstruction: Secondary | ICD-10-CM | POA: Diagnosis not present

## 2020-03-05 DIAGNOSIS — I10 Essential (primary) hypertension: Secondary | ICD-10-CM | POA: Diagnosis not present

## 2020-03-05 DIAGNOSIS — N3289 Other specified disorders of bladder: Secondary | ICD-10-CM | POA: Insufficient documentation

## 2020-03-05 DIAGNOSIS — N400 Enlarged prostate without lower urinary tract symptoms: Secondary | ICD-10-CM | POA: Diagnosis present

## 2020-03-05 DIAGNOSIS — B9620 Unspecified Escherichia coli [E. coli] as the cause of diseases classified elsewhere: Secondary | ICD-10-CM | POA: Diagnosis not present

## 2020-03-05 DIAGNOSIS — N138 Other obstructive and reflux uropathy: Secondary | ICD-10-CM | POA: Insufficient documentation

## 2020-03-05 HISTORY — PX: TRANSURETHRAL RESECTION OF PROSTATE: SHX73

## 2020-03-05 LAB — CREATININE, SERUM
Creatinine, Ser: 0.9 mg/dL (ref 0.61–1.24)
GFR, Estimated: >60 mL/min (ref >60)

## 2020-03-05 LAB — CBC
HCT: 43.7 % (ref 39.0–52.0)
Hemoglobin: 14.1 g/dL (ref 13.0–17.0)
MCH: 31.4 pg (ref 26.0–34.0)
MCHC: 32.3 g/dL (ref 30.0–36.0)
MCV: 97.3 fL (ref 80.0–100.0)
Platelets: 217 10*3/uL (ref 150–400)
RBC: 4.49 MIL/uL (ref 4.22–5.81)
RDW: 13.5 % (ref 11.5–15.5)
WBC: 7.2 10*3/uL (ref 4.0–10.5)
nRBC: 0 % (ref 0.0–0.2)

## 2020-03-05 LAB — GLUCOSE, CAPILLARY: Glucose-Capillary: 127 mg/dL — ABNORMAL HIGH (ref 70–99)

## 2020-03-05 SURGERY — TURP (TRANSURETHRAL RESECTION OF PROSTATE)
Anesthesia: General

## 2020-03-05 MED ORDER — DICYCLOMINE HCL 10 MG PO CAPS: 10.0000 mg | ORAL_CAPSULE | Freq: Every day | ORAL | Status: DC | PRN

## 2020-03-05 MED ORDER — AMLODIPINE BESYLATE 10 MG PO TABS
10.0000 mg | ORAL_TABLET | Freq: Every day | ORAL | Status: DC
  Administered 2020-03-05 – 2020-03-06 (×2): 10 mg via ORAL
  Filled 2020-03-05 (×2): qty 1

## 2020-03-05 MED ORDER — SERTRALINE HCL 50 MG PO TABS
75.0000 mg | ORAL_TABLET | Freq: Every day | ORAL | Status: DC
  Administered 2020-03-05 – 2020-03-06 (×2): 75 mg via ORAL
  Filled 2020-03-05 (×2): qty 1

## 2020-03-05 MED ORDER — LEVOTHYROXINE SODIUM 75 MCG PO TABS
75.0000 ug | ORAL_TABLET | Freq: Every day | ORAL | Status: DC
  Administered 2020-03-06: 75 ug via ORAL
  Filled 2020-03-05: qty 1

## 2020-03-05 MED ORDER — ONDANSETRON HCL 4 MG/2ML IJ SOLN: 4.0000 mg | INTRAMUSCULAR | Status: DC | PRN

## 2020-03-05 MED ORDER — ACETAMINOPHEN 325 MG PO TABS
650.0000 mg | ORAL_TABLET | ORAL | Status: DC | PRN
  Administered 2020-03-05 – 2020-03-06 (×2): 650 mg via ORAL
  Filled 2020-03-05 (×2): qty 2

## 2020-03-05 MED ORDER — OXYCODONE-ACETAMINOPHEN 5-325 MG PO TABS: 1.0000 | ORAL_TABLET | ORAL | 0 refills | Status: DC | PRN

## 2020-03-05 MED ORDER — BACITRACIN-NEOMYCIN-POLYMYXIN 400-5-5000 EX OINT
1.0000 | TOPICAL_OINTMENT | Freq: Three times a day (TID) | CUTANEOUS | Status: DC | PRN
Start: 2020-03-05 — End: 2020-03-06

## 2020-03-05 MED ORDER — LIDOCAINE HCL (PF) 2 % IJ SOLN
INTRAMUSCULAR | Status: AC
  Filled 2020-03-05: qty 5

## 2020-03-05 MED ORDER — PROPOFOL 10 MG/ML IV BOLUS
INTRAVENOUS | Status: AC
  Filled 2020-03-05: qty 20

## 2020-03-05 MED ORDER — SODIUM CHLORIDE 0.9 % IV SOLN
1.0000 g | INTRAVENOUS | Status: DC
  Administered 2020-03-05: 1 g via INTRAVENOUS
  Filled 2020-03-05: qty 10
  Filled 2020-03-05: qty 1

## 2020-03-05 MED ORDER — ACETAMINOPHEN 325 MG PO TABS
650.0000 mg | ORAL_TABLET | Freq: Once | ORAL | Status: AC
  Administered 2020-03-05: 650 mg via ORAL
  Filled 2020-03-05: qty 2

## 2020-03-05 MED ORDER — ALPRAZOLAM 0.5 MG PO TABS: 0.5000 mg | ORAL_TABLET | Freq: Three times a day (TID) | ORAL | Status: DC | PRN

## 2020-03-05 MED ORDER — ONDANSETRON HCL 4 MG/2ML IJ SOLN: 4.0000 mg | Freq: Once | INTRAMUSCULAR | Status: DC | PRN

## 2020-03-05 MED ORDER — DEXAMETHASONE SODIUM PHOSPHATE 10 MG/ML IJ SOLN
INTRAMUSCULAR | Status: DC | PRN
  Administered 2020-03-05: 4 mg via INTRAVENOUS

## 2020-03-05 MED ORDER — LIDOCAINE 2% (20 MG/ML) 5 ML SYRINGE
INTRAMUSCULAR | Status: DC | PRN
  Administered 2020-03-05: 40 mg via INTRAVENOUS

## 2020-03-05 MED ORDER — 0.9 % SODIUM CHLORIDE (POUR BTL) OPTIME
TOPICAL | Status: DC | PRN
  Administered 2020-03-05: 1000 mL

## 2020-03-05 MED ORDER — EPHEDRINE SULFATE-NACL 50-0.9 MG/10ML-% IV SOSY
PREFILLED_SYRINGE | INTRAVENOUS | Status: DC | PRN
  Administered 2020-03-05: 15 mg via INTRAVENOUS
  Administered 2020-03-05: 10 mg via INTRAVENOUS
  Administered 2020-03-05: 15 mg via INTRAVENOUS
  Administered 2020-03-05 (×4): 10 mg via INTRAVENOUS
  Administered 2020-03-05: 5 mg via INTRAVENOUS

## 2020-03-05 MED ORDER — DEXAMETHASONE SODIUM PHOSPHATE 10 MG/ML IJ SOLN
INTRAMUSCULAR | Status: AC
  Filled 2020-03-05: qty 1

## 2020-03-05 MED ORDER — DOCUSATE SODIUM 100 MG PO CAPS
100.0000 mg | ORAL_CAPSULE | Freq: Two times a day (BID) | ORAL | Status: DC
  Administered 2020-03-05 – 2020-03-06 (×2): 100 mg via ORAL
  Filled 2020-03-05 (×3): qty 1

## 2020-03-05 MED ORDER — OXYBUTYNIN CHLORIDE ER 5 MG PO TB24
10.0000 mg | ORAL_TABLET | Freq: Every day | ORAL | Status: DC
  Administered 2020-03-05 – 2020-03-06 (×2): 10 mg via ORAL
  Filled 2020-03-05 (×2): qty 2

## 2020-03-05 MED ORDER — HEPARIN SODIUM (PORCINE) 5000 UNIT/ML IJ SOLN
5000.0000 [IU] | Freq: Three times a day (TID) | INTRAMUSCULAR | Status: DC
  Administered 2020-03-05 – 2020-03-06 (×2): 5000 [IU] via SUBCUTANEOUS
  Filled 2020-03-05 (×3): qty 1

## 2020-03-05 MED ORDER — TAMSULOSIN HCL 0.4 MG PO CAPS
0.4000 mg | ORAL_CAPSULE | Freq: Every day | ORAL | Status: DC
  Administered 2020-03-05 – 2020-03-06 (×2): 0.4 mg via ORAL
  Filled 2020-03-05 (×2): qty 1

## 2020-03-05 MED ORDER — ZOLPIDEM TARTRATE 5 MG PO TABS
5.0000 mg | ORAL_TABLET | Freq: Every evening | ORAL | Status: DC | PRN
  Administered 2020-03-05: 5 mg via ORAL
  Filled 2020-03-05: qty 1

## 2020-03-05 MED ORDER — POTASSIUM CHLORIDE IN NACL 20-0.45 MEQ/L-% IV SOLN
INTRAVENOUS | Status: DC
  Filled 2020-03-05 (×3): qty 1000

## 2020-03-05 MED ORDER — ONDANSETRON HCL 4 MG/2ML IJ SOLN
INTRAMUSCULAR | Status: AC
  Filled 2020-03-05: qty 2

## 2020-03-05 MED ORDER — DOCUSATE SODIUM 100 MG PO CAPS: 100.0000 mg | ORAL_CAPSULE | Freq: Every day | ORAL | 0 refills | Status: AC | PRN

## 2020-03-05 MED ORDER — SODIUM CHLORIDE 0.9 % IR SOLN
Status: DC | PRN
  Administered 2020-03-05: 24000 mL

## 2020-03-05 MED ORDER — SODIUM CHLORIDE 0.9 % IR SOLN
3000.0000 mL | Status: DC
  Administered 2020-03-05: 3000 mL

## 2020-03-05 MED ORDER — ORAL CARE MOUTH RINSE
15.0000 mL | Freq: Once | OROMUCOSAL | Status: AC
  Administered 2020-03-05: 15 mL via OROMUCOSAL

## 2020-03-05 MED ORDER — STERILE WATER FOR IRRIGATION IR SOLN
Status: DC | PRN
  Administered 2020-03-05: 500 mL

## 2020-03-05 MED ORDER — CHLORHEXIDINE GLUCONATE 0.12 % MT SOLN: 15.0000 mL | Freq: Once | OROMUCOSAL | Status: AC

## 2020-03-05 MED ORDER — ONDANSETRON HCL 4 MG/2ML IJ SOLN
INTRAMUSCULAR | Status: DC | PRN
  Administered 2020-03-05: 4 mg via INTRAVENOUS

## 2020-03-05 MED ORDER — BISMUTH SUBSALICYLATE 262 MG/15ML PO SUSP: 30.0000 mL | Freq: Four times a day (QID) | ORAL | Status: DC | PRN

## 2020-03-05 MED ORDER — OXYCODONE-ACETAMINOPHEN 5-325 MG PO TABS: 1.0000 | ORAL_TABLET | ORAL | Status: DC | PRN

## 2020-03-05 MED ORDER — BELLADONNA ALKALOIDS-OPIUM 16.2-60 MG RE SUPP: 1.0000 | Freq: Four times a day (QID) | RECTAL | Status: DC | PRN

## 2020-03-05 MED ORDER — CEFAZOLIN SODIUM-DEXTROSE 2-4 GM/100ML-% IV SOLN
2.0000 g | Freq: Once | INTRAVENOUS | Status: AC
  Administered 2020-03-05: 2 g via INTRAVENOUS
  Filled 2020-03-05: qty 100

## 2020-03-05 MED ORDER — CHLORHEXIDINE GLUCONATE CLOTH 2 % EX PADS
6.0000 | MEDICATED_PAD | Freq: Every day | CUTANEOUS | Status: DC
  Administered 2020-03-06: 6 via TOPICAL

## 2020-03-05 MED ORDER — EPHEDRINE 5 MG/ML INJ
INTRAVENOUS | Status: AC
  Filled 2020-03-05: qty 10

## 2020-03-05 MED ORDER — FENTANYL CITRATE (PF) 100 MCG/2ML IJ SOLN: 25.0000 ug | INTRAMUSCULAR | Status: DC | PRN

## 2020-03-05 MED ORDER — FENTANYL CITRATE (PF) 100 MCG/2ML IJ SOLN
INTRAMUSCULAR | Status: AC
  Filled 2020-03-05: qty 2

## 2020-03-05 MED ORDER — DIPHENHYDRAMINE HCL 12.5 MG/5ML PO ELIX
12.5000 mg | ORAL_SOLUTION | Freq: Four times a day (QID) | ORAL | Status: DC | PRN
Start: 2020-03-05 — End: 2020-03-06

## 2020-03-05 MED ORDER — AMOXICILLIN-POT CLAVULANATE 875-125 MG PO TABS: 1.0000 | ORAL_TABLET | Freq: Two times a day (BID) | ORAL | 0 refills | Status: DC

## 2020-03-05 MED ORDER — PROPOFOL 10 MG/ML IV BOLUS
INTRAVENOUS | Status: DC | PRN
  Administered 2020-03-05: 120 mg via INTRAVENOUS

## 2020-03-05 MED ORDER — NEOMYCIN-POLYMYXIN-DEXAMETH 3.5-10000-0.1 OP SUSP: 1.0000 [drp] | Freq: Every day | OPHTHALMIC | Status: DC | PRN

## 2020-03-05 MED ORDER — HYDROMORPHONE HCL 1 MG/ML IJ SOLN: 0.5000 mg | INTRAMUSCULAR | Status: DC | PRN

## 2020-03-05 MED ORDER — DIPHENHYDRAMINE HCL 50 MG/ML IJ SOLN: 12.5000 mg | Freq: Four times a day (QID) | INTRAMUSCULAR | Status: DC | PRN

## 2020-03-05 MED ORDER — LACTATED RINGERS IV SOLN: INTRAVENOUS | Status: DC

## 2020-03-05 MED ORDER — FENTANYL CITRATE (PF) 100 MCG/2ML IJ SOLN
INTRAMUSCULAR | Status: DC | PRN
  Administered 2020-03-05: 25 ug via INTRAVENOUS
  Administered 2020-03-05: 50 ug via INTRAVENOUS
  Administered 2020-03-05: 25 ug via INTRAVENOUS

## 2020-03-05 SURGICAL SUPPLY — 20 items
BAG URINE DRAIN 2000ML AR STRL (UROLOGICAL SUPPLIES) ×2 IMPLANT
BAG URO CATCHER STRL LF (MISCELLANEOUS) ×2 IMPLANT
CATH FOLEY 3WAY 30CC 22FR (CATHETERS) ×2 IMPLANT
CATH FOLEY 3WAY 30CC 24FR (CATHETERS)
CATH URTH STD 24FR FL 3W 2 (CATHETERS) IMPLANT
ELECT REM PT RETURN 15FT ADLT (MISCELLANEOUS) IMPLANT
GLOVE SURG ENC TEXT LTX SZ7.5 (GLOVE) ×2 IMPLANT
GOWN STRL REUS W/TWL LRG LVL3 (GOWN DISPOSABLE) ×2 IMPLANT
GUIDEWIRE STR DUAL SENSOR (WIRE) ×2 IMPLANT
HOLDER FOLEY CATH W/STRAP (MISCELLANEOUS) ×2 IMPLANT
IV CATH 14GX2 1/4 (CATHETERS) ×2 IMPLANT
KIT TURNOVER KIT A (KITS) IMPLANT
LOOP CUT BIPOLAR 24F LRG (ELECTROSURGICAL) ×2 IMPLANT
MANIFOLD NEPTUNE II (INSTRUMENTS) ×2 IMPLANT
PACK CYSTO (CUSTOM PROCEDURE TRAY) ×2 IMPLANT
SYR 30ML LL (SYRINGE) ×2 IMPLANT
SYR TOOMEY IRRIG 70ML (MISCELLANEOUS) ×2
SYRINGE TOOMEY IRRIG 70ML (MISCELLANEOUS) ×1 IMPLANT
TUBING CONNECTING 10 (TUBING) ×2 IMPLANT
TUBING UROLOGY SET (TUBING) ×2 IMPLANT

## 2020-03-05 NOTE — Op Note (Signed)
Operative Note  Preoperative diagnosis:  1.  BPH with bladder outlet obstruction 2. Underactive bladder  Postoperative diagnosis: 1.  BPH with bladder outlet obstruction 2. Underactive bladder  Procedure(s): 1.  Bipolar transurethral resection of prostate  Surgeon: Rexene Alberts, MD  Assistants:  None  Anesthesia:  General  Complications:  None  EBL:  58ml  Specimens: 1. Prostate chips ID Type Source Tests Collected by Time Destination  1 : Prostate Chips Tissue PATH Prostate TURP SURGICAL PATHOLOGY Janith Lima, MD 03/05/2020 1030     Drains/Catheters: 1.  22Fr 3 way catheter with 15ml water into balloon  Intraoperative findings:   1. 4+ bladder trabeculation with large capacity bladder 2. Obstructing bilobar lobes successfully resected with wide open prostatic fossa 3. Excellent hemostasis  Indication:  Marvin Jones. is a 85 y.o. male with BPH with bladder outlet obstruction presenting for transurethral resection of the prostate. He had a TRUS volume of 31.  Preop cystoscopy demonstrated  bilobar hypertrophy with trabeculated bladder.  UDS 12/30/2019 with max contraction at 18 cm of water. He is undergoing CIC. After thorough discussion including all relevant risk benefits and alternatives, he presents today for a bipolar TURP. He understands that he may need to continue to catheterize given his underactive bladder.  Description of procedure: The indication, alternatives, benefits and risks were discussed with the patient and informed consent was obtained.  Patient was brought to the operating room table, positioned supine, secured with a safety strap.  Pneumatic compression devices were placed on the lower extremities.  After the administration of intravenous antibiotics and general anesthesia, the patient was repositioned into the dorsal lithotomy position.  All pressure points were carefully padded.  A rectal examination was performed confirming a smooth symmetric  enlarged gland.  The genitalia were prepped and draped in standard sterile manner.  A timeout was completed, verifying the correct patient, surgical procedure and positioning prior to beginning the procedure.  Isotonic sodium chloride was used for irrigation.  A 26 French continuous-flow resectoscope sheath with the visual obturator and a 30 degree lens was advanced under direct vision into the bladder.  The anterior urethra appeared normal in its entirety. The prostatic urethra was elongated with bilobarhyperplasia.  On cystoscopic evaluation, his bladder was severely trabeculated with large capacity. There were no tumors, stones or foreign bodies present. The bladder was severely trabeculated with normal-appearing mucosa.  Both ureteral orifices were in their normal anatomic positions with clear urinary reflux noted bilaterally.  The obturator was removed and replaced by the working element with a resection loop.  The location of the ureteral orifices and the prostatic configuration were again confirmed.  Starting at the bladder neck and proceeding distally to the verumontanum a transurethral section of the prostate was performed using bipolar using energy of 4 and 5 for cutting and coagulation, respectively.  The procedure began at the bladder neck at the 5 o'clock and 7 o'clock positions and carefully carried distally to the verumontanum, resecting the intervening prostatic adenoma.  Next the left lateral lobe was resected to the level of the transverse capsular fibers.  The identical procedure was performed on the right lobe.  Attention was then directed anteriorly and the resection was completed from the 10 o'clock to 2 o'clock positions.  All bleeding vessels were fulgurated achieving meticulous hemostasis.  The bladder was irrigated with a Toomey syringe, ensuring removal of all prostate chips which were sent to pathology for evaluation.  Having completed the resection and the  chips removed, we again  confirmed hemostasis with the loop with coagulating current.  Upon completion of the entire procedure, the bladder and posterior urethra were reexamined, confirming open prostatic urethra and bladder neck without evidence of bleeding or perforation.  Both ureteral orifices and the external sphincter were noted to be intact.  The resectoscope was withdrawn under direct vision and a 22 French three-way Foley catheter with a 30 cc balloon was inserted into the bladder.  The balloon was inflated with 30 cc of sterile water.  After multiple manual irrigations ensuring clear return of the irrigant, the procedure was terminated.  The catheter was attached to a drainage bag and continuous bladder irrigation was started with normal saline.  The patient was positioned supine.  At the end of the procedure, all counts were correct.  Patient tolerated the procedure well and was taken to the recovery room satisfactory condition.  Plan: Continuous bladder irrigation overnight. Plan to discharge home tomorrow with Foley catheter in place and void trial in the office in 3 days.  [facility]  Pager: [phone]

## 2020-03-05 NOTE — Anesthesia Preprocedure Evaluation (Signed)
Anesthesia Evaluation  Patient identified by MRN, date of birth, ID band Patient awake    Reviewed: Allergy & Precautions, NPO status , Patient's Chart, lab work & pertinent test results  Airway Mallampati: II  TM Distance: >3 FB Neck ROM: Full    Dental no notable dental hx. (+) Teeth Intact   Pulmonary former smoker,    Pulmonary exam normal breath sounds clear to auscultation       Cardiovascular Exercise Tolerance: Good hypertension, Pt. on medications + DOE  Normal cardiovascular exam Rhythm:Regular Rate:Normal  Stress test 03/08/18  Pt walked for 6:22 of a Bruce protocol GXT . Peak HR of 157 which is 112% predicted maximal HR  There was no ST segment deviation noted during stress.  Borderline hypertensive response to exercise  Negative GXT . No evidence of ischemia .   EKG 01/12/20 NSR, LAFB   Neuro/Psych PSYCHIATRIC DISORDERS Anxiety Depression  Neuromuscular disease    GI/Hepatic Neg liver ROS, hiatal hernia, GERD  Medicated and Controlled,Barrett's esophagus   Endo/Other    Renal/GU negative Renal ROS   BPH with obstruction    Musculoskeletal  (+) Arthritis , Osteoarthritis,    Abdominal (+) - obese,   Peds  Hematology negative hematology ROS (+)   Anesthesia Other Findings   Reproductive/Obstetrics                             Anesthesia Physical Anesthesia Plan  ASA: II  Anesthesia Plan: General   Post-op Pain Management:    Induction: Intravenous  PONV Risk Score and Plan: 4 or greater and Treatment may vary due to age or medical condition and Ondansetron  Airway Management Planned: LMA  Additional Equipment:   Intra-op Plan:   Post-operative Plan: Extubation in OR  Informed Consent: I have reviewed the patients History and Physical, chart, labs and discussed the procedure including the risks, benefits and alternatives for the proposed anesthesia with  the patient or authorized representative who has indicated his/her understanding and acceptance.     Dental advisory given  Plan Discussed with: CRNA and Anesthesiologist  Anesthesia Plan Comments:         Anesthesia Quick Evaluation

## 2020-03-05 NOTE — Anesthesia Procedure Notes (Signed)
Procedure Name: LMA Insertion Date/Time: 03/05/2020 10:42 AM Performed by: Eben Burow, CRNA Pre-anesthesia Checklist: Patient identified, Emergency Drugs available, Suction available, Patient being monitored and Timeout performed Patient Re-evaluated:Patient Re-evaluated prior to induction Oxygen Delivery Method: Circle system utilized Preoxygenation: Pre-oxygenation with 100% oxygen Induction Type: IV induction Ventilation: Mask ventilation without difficulty LMA: LMA inserted LMA Size: 4.0 Placement Confirmation: positive ETCO2 Tube secured with: Tape Dental Injury: Teeth and Oropharynx as per pre-operative assessment

## 2020-03-05 NOTE — H&P (Addendum)
Office Visit Report     02/25/2020   --------------------------------------------------------------------------------   Marvin Jones  [id number]  DOB: 1935-08-26, 85 year old Male  SSN: [id number]   PRIMARY CARE:  Deland Pretty, MD  REFERRING:  Azucena Fallen, NP  PROVIDER:  Rexene Alberts, M.D.  LOCATION:  [facility] [phone]     --------------------------------------------------------------------------------   CC/HPI: Marvin Jones is an 85 year old male seen in followup today for BPH with urinary tract symptoms.   He has a long history of urinary retention and was found to have a PVR of around 263ml in 12/2007. He was started on Flomax however stopped taking this after some years. He was found to have a PVR of 780 mL and 09/2015. He had Foley catheter placed at that time and was started on alpha blockade therapy and finasteride. He was eventually able to void with persistently elevated PVRs.   In 11/2019 he had complaints of dysuria and increased urinary frequency over the past month. At the time he was only taking finasteride and no longer taking tamsulosin. PVR was obtained that was over 1 L. He was advised Foley catheter placement however declined this. He was instructed to undergo CIC. He has been doing this about one time a day.   In the interim he underwent urodynamic studies which was performed on 12/30/2019. This revealed a bladder capacity of 1.6 L. His first sensation was very mild at 200 mL. He generated a voluntary contraction of 10 cm of water but was unable to void. After standing, he had a voluntary contraction of 18 cm of water however was still unable to void. He had a severely trabeculated bladder on video urodynamics.   He states he is having urinary frequency, urgency, sensation include bladder emptying, nocturia. He is very interested in surgical intervention. We did discuss that given his lack of strong bladder detrusor function, this procedure may not  benefit him as he would still have an elevated residual. He is aware of this and will continue to CIC if needed.   He underwent BPH evaluation with TRUS ultrasound on 01/05/2020 with volume 31 g. Cystoscopy 01/08/2020 revealed bilobar hypertrophy with trabeculated bladder.   He has had some intermittent dysuria, feelings of shakiness. Urine culture 02/09/2020 revealed no growth. Repeat urine culture 02/11/2020 was also no growth. Urine culture 02/16/2020 revealed 100,000 E. coli. He was placed on amoxicillin by his PCP. He states since this, he has been voiding well with no dysuria. He continues to CIC. He denies fevers or chills.   Patient currently denies fever, chills, sweats, nausea, vomiting, abdominal or flank pain, gross hematuria or dysuria.     ALLERGIES: Rapaflo    MEDICATIONS: Aspirin  Cipro 500 mg tablet 1 tablet PO BID  Finasteride 5 mg tablet 1 tablet PO Daily  Phenazopyridine Hcl 100 mg tablet 1 tablet PO Q 8 H PRN  Tamsulosin Hcl 0.4 mg capsule 1 capsule PO BID  Amlodipine Besilate  Sildenafil Citrate 100 mg tablet 1 tablet PO Daily PRN  Zoloft 50 mg tablet     GU PSH: Complex cystometrogram, w/ void pressure and urethral pressure profile studies, any technique - 12/30/2019 Complex Uroflow - 12/30/2019, 2017 Cystoscopy - 01/08/2020 Emg surf Electrd - 12/30/2019 Inject For cystogram - 12/30/2019 Intrabd voidng Press - 12/30/2019     NON-GU PSH: Tonsillectomy..     GU PMH: Areflexic bladder - 02/11/2020, - 01/08/2020, - 01/02/2020 BPH w/LUTS - 02/11/2020, - 01/08/2020, - 01/05/2020, - 01/02/2020, -  12/11/2019, - 12/05/2019 (Stable), He has marked BPH. He's on tamsulosin which she is tolerating at a 0.4 mg dose. I recommended increasing the dosage to 0.8 mg., - 2017 Urinary Retention - 02/11/2020, (Stable), - 01/02/2020 (Stable), He is still holding A fair amount in his bladder but subjectively he has noted significant improvement so I'm going to continue him on  pharmacologic therapy., - 2017 (Improving), He did technically pass his voiding trial but had an elevated PVR. We discussed the options which would be replace his Foley catheter versus forcing fluids and returning if he is unable to urinate. He would like to try to force fluids with the understanding that he will return if he is unable to urinate., - 2017 (Acute), Rapaflo 8 mg 1 po daily Foley placed will RTC for attempted voiding trail and cysto, - 2017 Incomplete bladder emptying - 01/08/2020, - 01/06/2020, - 12/30/2019, - 12/11/2019, - 12/05/2019 (Stable), He did not completely empty his bladder today so I'm going to see if he will do better on the higher dose of tamsulosin and if he is voiding well he will return for recheck again in 2 weeks with a PVR at that time. Once he is out of urinary retention he will need a PSA., - 2017 Nocturia - 12/11/2019, Nocturia, - 2014 Dysuria - 12/05/2019, - 2017 Obstructive and reflux uropathy, Unspec - 12/05/2019 Weak Urinary Stream, With weak split stream will have pt RTC for possible cysto w/MD - 2017 ED due to arterial insufficiency, Erectile dysfunction due to arterial insufficiency - 2014      PMH Notes:  1   NON-GU PMH: Arthritis GERD    FAMILY HISTORY: 2 daughters - Runs in Family 1 son - Runs in Family Death In The Family Father - Runs In Family Death In The Family Mother - Runs In Family Heart problem - Father   SOCIAL HISTORY: Marital Status: Widowed Preferred Language: English; Ethnicity: Not Hispanic Or Latino; Race: White Current Smoking Status: Patient has never smoked.  Does not use smokeless tobacco. Drinks 1 drink per week.  Drinks 2 caffeinated drinks per day.     Notes: 1 son, 2 daughters    REVIEW OF SYSTEMS:    GU Review Male:   Patient denies frequent urination, hard to postpone urination, burning/ pain with urination, get up at night to urinate, leakage of urine, stream starts and stops, trouble starting your stream, have  to strain to urinate , erection problems, and penile pain.  Gastrointestinal (Upper):   Patient denies nausea, vomiting, and indigestion/ heartburn.  Gastrointestinal (Lower):   Patient denies diarrhea and constipation.  Constitutional:   Patient denies fever, night sweats, weight loss, and fatigue.  Skin:   Patient denies skin rash/ lesion and itching.  Eyes:   Patient denies double vision and blurred vision.  Ears/ Nose/ Throat:   Patient denies sore throat and sinus problems.  Hematologic/Lymphatic:   Patient denies swollen glands and easy bruising.  Cardiovascular:   Patient denies leg swelling and chest pains.  Respiratory:   Patient denies cough and shortness of breath.  Endocrine:   Patient denies excessive thirst.  Musculoskeletal:   Patient denies back pain and joint pain.  Neurological:   Patient denies headaches and dizziness.  Psychologic:   Patient denies depression and anxiety.   VITAL SIGNS:      02/25/2020 10:35 AM  Weight 168 lb / 76.2 kg  Height 69 in / 175.26 cm  BP 158/79 mmHg  Pulse 80 /min  Temperature 96.9 F / 36.0 C  BMI 24.8 kg/m   MULTI-SYSTEM PHYSICAL EXAMINATION:    Constitutional: Well-nourished. No physical deformities. Normally developed. Good grooming.  Respiratory: No labored breathing, no use of accessory muscles.   Cardiovascular: Normal temperature, normal extremity pulses, no swelling, no varicosities.  Gastrointestinal: No mass, no tenderness, no rigidity, non obese abdomen.     Complexity of Data:  Source Of History:  Patient, Medical Record Summary  Lab Test Review:   PSA  Records Review:   AUA Symptom Score  Urodynamics Review:   Review Bladder Scan   10/27/15  PSA  Total PSA 4.74   Free PSA 1.23   % Free PSA 26     PROCEDURES:         PVR Ultrasound - 98119  Scanned Volume: 169 cc         Urinalysis - 81003 Dipstick Dipstick Cont'd  Color: Yellow Bilirubin: Neg  Appearance: Clear Ketones: Neg  Specific Gravity: 1.020  Blood: Neg  pH: 7.0 Protein: Neg  Glucose: Neg Urobilinogen: 0.2    Nitrites: Neg    Leukocyte Esterase: Neg    Notes:      ASSESSMENT:      ICD-10 Details  1 GU:   Areflexic bladder - N31.2   2   BPH w/LUTS - N40.1   3   Nocturia - R35.1   4   Acute Cystitis/UTI - N30.00    PLAN:           Orders Labs CULTURE, URINE          Document Letter(s):  Created for Patient: Clinical Summary         Notes:   #1. BPH with LUTS: Long history of urinary retention on alpha-blocker and finasteride therapy as above. TRUS volume 01/05/2020 31 g. Cystoscopy 01/08/2020 with bilobar hypertrophy and trabeculated bladder. He continues on CIC 6 times a day. We did discuss the possibility of surgical intervention however reviewing his urodynamics as above, we discussed that he has an underactive bladder and a TURP may not benefit him. We discussed possibility of being catheter free after TURP, however there is a chance that he would require CIC despite TURP. He elects to proceed with bipolar TURP. This is been scheduled for January 2022.   #2. Underactive bladder: UDS 12/30/2019 with max contraction at 18 cm of water. Continue CIC 6 times daily as above.   #3. Acute cystitis: Patient urine culture 02/16/2020 with 100,000 E. coli. Started on amoxicillin by his PCP. Will complete course. Obtain urine culture today. Will need to treat preop for planned bipolar TURP.   CC: Deland Pretty, MD        Next Appointment:      Next Appointment: 03/05/2020 10:30 AM    Appointment Type: Surgery     Location: [facility] [phone]    Provider: Rexene Alberts, M.D.    Reason for Visit: WL/OP BIPOLAR TURP      Signed by Rexene Alberts, M.D. on 02/26/20 at 10:32 PM (EST)  Urology Preoperative H&P   Chief Complaint: BPH  History of Present Illness: Marvin Jones. is a 85 y.o. male with BPH, underactive bladder here for TURP. Denies fevers or chills. Last UCx 02/25/2020 negative.    Past  Medical History:  Diagnosis Date  . Anxiety and depression    h/o  . Arthritis   . Barrett's esophagus   . Diaphragmatic hernia without mention of obstruction or gangrene   .  Diverticulosis of colon (without mention of hemorrhage)   . Esophageal reflux   . HTN (hypertension)   . Hyperlipidemia   . Hypertrophy of prostate with urinary obstruction and other lower urinary tract symptoms (LUTS)   . Irritable bowel syndrome   . Personal history of colonic polyps   . Skin cancer    sees Dr Ronnald Ramp   . Stricture and stenosis of esophagus     Past Surgical History:  Procedure Laterality Date  . TONSILLECTOMY      Allergies: No Known Allergies  Family History  Problem Relation Age of Onset  . Heart attack Father 60  . Diabetes Son   . Colon cancer Neg Hx   . Prostate cancer Neg Hx   . Hypertension Neg Hx     Social History:  reports that he has quit smoking. His smoking use included cigars. He has never used smokeless tobacco. He reports current alcohol use. He reports that he does not use drugs.  ROS: A complete review of systems was performed.  All systems are negative except for pertinent findings as noted.  Physical Exam:  Vital signs in last 24 hours: Temp:  [98.2 F (36.8 C)] 98.2 F (36.8 C) (01/14 0854) Pulse Rate:  [61] 61 (01/14 0854) Resp:  [18] 18 (01/14 0854) BP: (126)/(82) 126/82 (01/14 0854) SpO2:  [99 %] 99 % (01/14 0854) Constitutional:  Alert and oriented, No acute distress Cardiovascular: Regular rate and rhythm Respiratory: Normal respiratory effort, Lungs clear bilaterally GI: Abdomen is soft, nontender, nondistended, no abdominal masses GU: No CVA tenderness Lymphatic: No lymphadenopathy Neurologic: Grossly intact, no focal deficits Psychiatric: Normal mood and affect  Laboratory Data:  No results for input(s): WBC, HGB, HCT, PLT in the last 72 hours.  No results for input(s): NA, K, CL, GLUCOSE, BUN, CALCIUM, CREATININE in the last 72  hours.  Invalid input(s): CO3   No results found for this or any previous visit (from the past 24 hour(s)). Recent Results (from the past 240 hour(s))  SARS CORONAVIRUS 2 (TAT 6-24 HRS) Nasopharyngeal Nasopharyngeal Swab     Status: None   Collection Time: 03/02/20  3:15 PM   Specimen: Nasopharyngeal Swab  Result Value Ref Range Status   SARS Coronavirus 2 NEGATIVE NEGATIVE Final    Comment: (NOTE) SARS-CoV-2 target nucleic acids are NOT DETECTED.  The SARS-CoV-2 RNA is generally detectable in upper and lower respiratory specimens during the acute phase of infection. Negative results do not preclude SARS-CoV-2 infection, do not rule out co-infections with other pathogens, and should not be used as the sole basis for treatment or other patient management decisions. Negative results must be combined with clinical observations, patient history, and epidemiological information. The expected result is Negative.  Fact Sheet for Patients: SugarRoll.be  Fact Sheet for Healthcare Providers: https://www.woods-mathews.com/  This test is not yet approved or cleared by the [place] FDA and  has been authorized for detection and/or diagnosis of SARS-CoV-2 by FDA under an Emergency Use Authorization (EUA). This EUA will remain  in effect (meaning this test can be used) for the duration of the COVID-19 declaration under Se ction 564(b)(1) of the Act, 21 U.S.C. section 360bbb-3(b)(1), unless the authorization is terminated or revoked sooner.  Performed at [facility], [place] [address], [place], [place] [address]     Renal Function: No results for input(s): CREATININE in the last 168 hours. Estimated Creatinine Clearance: 64.7 mL/min (by C-G formula based on SCr of 0.88 mg/dL).  Radiologic  Imaging: No results found.  I independently reviewed the above imaging studies.  Assessment and Plan Hipolito Alltop. is a 85 y.o. male with   BPH, underactive bladder here for TURP. Denies fevers or chills. Last UCx 02/25/20 negative. Ok to proceed.   Matt R. Diany Formosa MD 03/05/2020, 10:33 AM  [facility] Pager: [phone]): [phone]

## 2020-03-05 NOTE — Transfer of Care (Signed)
Immediate Anesthesia Transfer of Care Note  Patient: Marvin Jones.  Procedure(s) Performed: TRANSURETHRAL RESECTION OF THE PROSTATE (TURP), BIPOLAR (N/A )  Patient Location: PACU  Anesthesia Type:General  Level of Consciousness: awake, alert  and oriented  Airway & Oxygen Therapy: Patient Spontanous Breathing and Patient connected to face mask oxygen  Post-op Assessment: Report given to RN and Post -op Vital signs reviewed and stable  Post vital signs: Reviewed and stable  Last Vitals:  Vitals Value Taken Time  BP 129/64 03/05/20 1245  Temp 36.4 C 03/05/20 1211  Pulse 79 03/05/20 1246  Resp 20 03/05/20 1234  SpO2 93 % 03/05/20 1246  Vitals shown include unvalidated device data.  Last Pain:  Vitals:   03/05/20 1230  TempSrc:   PainSc: (P) 0-No pain         Complications: No complications documented.

## 2020-03-05 NOTE — Anesthesia Postprocedure Evaluation (Signed)
Anesthesia Post Note  Patient: Adreyan Carbajal.  Procedure(s) Performed: TRANSURETHRAL RESECTION OF THE PROSTATE (TURP), BIPOLAR (N/A )     Patient location during evaluation: PACU Anesthesia Type: General Level of consciousness: awake and alert and oriented Pain management: pain level controlled Vital Signs Assessment: post-procedure vital signs reviewed and stable Respiratory status: spontaneous breathing, nonlabored ventilation and respiratory function stable Cardiovascular status: blood pressure returned to baseline and stable Postop Assessment: no apparent nausea or vomiting Anesthetic complications: no   No complications documented.  Last Vitals:  Vitals:   03/05/20 1230 03/05/20 1245  BP: 123/75 129/64  Pulse: 77 77  Resp: 18 17  Temp:    SpO2: 99% 95%    Last Pain:  Vitals:   03/05/20 1245  TempSrc:   PainSc: 0-No pain                 Maple Odaniel A.

## 2020-03-05 NOTE — Discharge Instructions (Signed)

## 2020-03-06 ENCOUNTER — Encounter (HOSPITAL_COMMUNITY): Payer: Self-pay | Admitting: Urology

## 2020-03-06 DIAGNOSIS — R338 Other retention of urine: Secondary | ICD-10-CM | POA: Diagnosis not present

## 2020-03-06 DIAGNOSIS — Z7982 Long term (current) use of aspirin: Secondary | ICD-10-CM | POA: Diagnosis not present

## 2020-03-06 DIAGNOSIS — N3000 Acute cystitis without hematuria: Secondary | ICD-10-CM | POA: Diagnosis not present

## 2020-03-06 DIAGNOSIS — Z79899 Other long term (current) drug therapy: Secondary | ICD-10-CM | POA: Diagnosis not present

## 2020-03-06 DIAGNOSIS — N138 Other obstructive and reflux uropathy: Secondary | ICD-10-CM | POA: Diagnosis not present

## 2020-03-06 DIAGNOSIS — N3289 Other specified disorders of bladder: Secondary | ICD-10-CM | POA: Diagnosis not present

## 2020-03-06 DIAGNOSIS — Z87891 Personal history of nicotine dependence: Secondary | ICD-10-CM | POA: Diagnosis not present

## 2020-03-06 DIAGNOSIS — N401 Enlarged prostate with lower urinary tract symptoms: Secondary | ICD-10-CM | POA: Diagnosis not present

## 2020-03-06 LAB — BASIC METABOLIC PANEL
Anion gap: 9 (ref 5–15)
BUN: 25 mg/dL — ABNORMAL HIGH (ref 8–23)
CO2: 23 mmol/L (ref 22–32)
Calcium: 8.9 mg/dL (ref 8.9–10.3)
Chloride: 103 mmol/L (ref 98–111)
Creatinine, Ser: 0.75 mg/dL (ref 0.61–1.24)
GFR, Estimated: >60 mL/min (ref >60)
Glucose, Bld: 108 mg/dL — ABNORMAL HIGH (ref 70–99)
Potassium: 4.1 mmol/L (ref 3.5–5.1)
Sodium: 135 mmol/L (ref 135–145)

## 2020-03-06 LAB — CBC
HCT: 38.1 % — ABNORMAL LOW (ref 39.0–52.0)
Hemoglobin: 12.9 g/dL — ABNORMAL LOW (ref 13.0–17.0)
MCH: 32.6 pg (ref 26.0–34.0)
MCHC: 33.9 g/dL (ref 30.0–36.0)
MCV: 96.2 fL (ref 80.0–100.0)
Platelets: 218 10*3/uL (ref 150–400)
RBC: 3.96 MIL/uL — ABNORMAL LOW (ref 4.22–5.81)
RDW: 13.4 % (ref 11.5–15.5)
WBC: 15.3 10*3/uL — ABNORMAL HIGH (ref 4.0–10.5)
nRBC: 0 % (ref 0.0–0.2)

## 2020-03-06 LAB — SURGICAL PATHOLOGY

## 2020-03-06 NOTE — Discharge Summary (Signed)
Date of admission: 03/05/2020  Date of discharge: 03/06/2020  Admission diagnosis: BPH with lower urinary tract symptoms  Discharge diagnosis: Same  Procedures: Bipolar TURP with Dr. Abner Greenspan  History and Physical: For full details, please see admission history and physical. Briefly, Marvin Goshert. is a 85 y.o. year old patient with BPH with LUTS.  He is status post TURP on 03/05/2020 with Dr. Abner Greenspan.  Hospital Course: Postoperatively, the patient was monitored on the floor with CBI.  On postop day 1, his urine was clear with minimal CBI.  He had no complaints of pain and was tolerating regular diet.  He was discharged home with his Foley catheter with plans for a voiding trial in approximately 3 days.  Physical Exam:  General: Alert and oriented CV: RRR, palpable distal pulses Lungs: CTAB, equal chest rise Abdomen: Soft, NTND, no rebound or guarding GU: Foley catheter in place and draining clear-yellow urine Ext: NT, No erythema  Laboratory values: Recent Labs    03/05/20 1432 03/06/20 0733  HGB 14.1 12.9*  HCT 43.7 38.1*   Recent Labs    03/05/20 1432 03/06/20 0733  CREATININE 0.90 0.75    Disposition: Home  Discharge instruction: The patient was instructed to be ambulatory but told to refrain from heavy lifting, strenuous activity, or driving.  Discharge medications:  Allergies as of 03/06/2020   No Known Allergies     Medication List    TAKE these medications   acetaminophen 500 MG tablet Commonly known as: TYLENOL Take 500-1,000 mg by mouth 2 (two) times daily.   ALPRAZolam 0.5 MG tablet Commonly known as: XANAX Take 0.5 mg by mouth 3 (three) times daily as needed for anxiety.   amLODipine 10 MG tablet Commonly known as: NORVASC Take 10 mg by mouth daily.   amoxicillin-clavulanate 875-125 MG tablet Commonly known as: AUGMENTIN Take 1 tablet by mouth 2 (two) times daily.   bismuth subsalicylate 998 PJ/82NK suspension Commonly known as: PEPTO BISMOL Take 30  mLs by mouth every 6 (six) hours as needed for indigestion.   dicyclomine 10 MG capsule Commonly known as: BENTYL Take 1 capsule (10 mg total) by mouth in the morning and at bedtime. What changed:   when to take this  reasons to take this   docusate sodium 100 MG capsule Commonly known as: Colace Take 1 capsule (100 mg total) by mouth daily as needed for up to 30 doses.   levothyroxine 75 MCG tablet Commonly known as: SYNTHROID Take 75 mcg by mouth daily before breakfast.   neomycin-polymyxin b-dexamethasone 3.5-10000-0.1 Susp Commonly known as: MAXITROL Place 1 drop into both eyes daily as needed for allergies.   oxyCODONE-acetaminophen 5-325 MG tablet Commonly known as: Percocet Take 1 tablet by mouth every 4 (four) hours as needed for up to 12 doses for severe pain.   sertraline 50 MG tablet Commonly known as: Zoloft Take 1.5 tablets (75 mg total) by mouth daily.   sildenafil 100 MG tablet Commonly known as: VIAGRA Take 100 mg by mouth daily as needed for erectile dysfunction. pulmonary hypertension   tamsulosin 0.4 MG Caps capsule Commonly known as: FLOMAX Take 0.4 mg by mouth daily.   zaleplon 5 MG capsule Commonly known as: SONATA Take 5 mg by mouth at bedtime as needed for sleep.       Followup:   Follow-up Information    [facility] In 3 days.   Why: 9:45AM Contact information: [place]  [phone]               

## 2020-03-06 NOTE — Progress Notes (Signed)
Pt given and explained discharge instructions. Pt also educated on how to care for the foley catheter and given extra supplies for home, if needed. Pt demonstrated how to empty and care for foley. IV removed. CBI plugged. Foley left in place upon discharge. All questions answered. Pt taken to main entrance via wheelchair.

## 2020-03-12 ENCOUNTER — Telehealth: Payer: Self-pay | Admitting: Internal Medicine

## 2020-03-12 NOTE — Telephone Encounter (Signed)
Discussed with pt that he can try a miralax purge and that should help with his constipation. He verbalized understanding.

## 2020-03-12 NOTE — Telephone Encounter (Signed)
Pt has been experiencing severe constipation. He would like some advise. Pls call him.

## 2020-03-22 NOTE — Telephone Encounter (Signed)
Pt states he has noticed within the last 6 mths that he has a slight, shake/tremor in his hands. 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 He also states he tires more easily. States his PCP reports all is good,labs etc and encouraged him to eat right and exercise which he states he is doing. He reports he respects Dr. Blanch Media opinion and he would like to know if Dr. Henrene Pastor feels he should seek an eval from neurology of if this is to be expected. Please advise.

## 2020-03-22 NOTE — Telephone Encounter (Signed)
Spoke with pt and he is aware. Pt states he will have his PCP make there referral.

## 2020-03-22 NOTE — Telephone Encounter (Signed)
Patient called states he is experiencing a lot of shaking and weakness and is seeking advise

## 2020-03-22 NOTE — Telephone Encounter (Signed)
Yes, I recommend a neurology evaluation.  If he does not have a neurologist, please refer.  Thanks

## 2020-03-24 ENCOUNTER — Encounter: Payer: Self-pay | Admitting: Neurology

## 2020-03-25 DIAGNOSIS — D3132 Benign neoplasm of left choroid: Secondary | ICD-10-CM | POA: Diagnosis not present

## 2020-03-25 DIAGNOSIS — H401131 Primary open-angle glaucoma, bilateral, mild stage: Secondary | ICD-10-CM | POA: Diagnosis not present

## 2020-03-25 DIAGNOSIS — H5203 Hypermetropia, bilateral: Secondary | ICD-10-CM | POA: Diagnosis not present

## 2020-03-26 DIAGNOSIS — R339 Retention of urine, unspecified: Secondary | ICD-10-CM | POA: Diagnosis not present

## 2020-03-26 NOTE — Progress Notes (Signed)
Assessment/Plan:   1.  Tremor  -characteristics most consistent with ET, but I brought up to the patient that this generally does not occur acutely, and technically the tumor needs to be there for at least 3 years before this diagnosis is made.  He then reported that perhaps the tremor has been present a lot longer and he admits that it really was gradual in onset.  -We did discuss various treatments, and ultimately he just decided he did not want more medication.  -I would like to follow him in the future just to make sure he is not developing a neurodegenerative process.  He does not meet criteria today for Parkinson's disease.  Discussed with him that this does not mean that this could not develop in the future.  -Biggest issue with loss of balance really is the chronic dropfoot on the left.  2.  Low BP  -Discussed with patient that I wanted him to follow-up with his primary care physician.  Discussed that the amlodipine and the tamsulosin could be contributing.  He admitted that he doubled up on the tamsulosin to help the prostate.  Told him to make sure that he reported this to his urologist and primary care.  He just had a TURP 2 weeks ago.  Subjective:   Marvin Jones. was seen today in the movement disorders clinic for neurologic consultation at the request of Deland Pretty, MD.  The consultation is for the evaluation of tremor.  Notes sent do not reference tremor, but only generalized anxiety disorder.    Tremor: Yes.     How long has it been going on? 4-5 months  At rest or with activation?  rest  When is it noted the most?  "all the time"  Fam hx of tremor?  No.  Located where?  Bilateral UE (started in both hands)  Affected by caffeine:  No. (2 cups of coffee/day)  Affected by alcohol:  No. (1 time per week)  Affected by stress:  No.  Affected by fatigue:  No.  Spills soup if on spoon:  No.   Spills glass of liquid if full:  No.   Other Specific Symptoms:  Voice: no  change Sleep: "I sleep okay"- had TURP few weeks ago  Vivid Dreams:  Yes.    Acting out dreams:  No. Wet Pillows: No. Postural symptoms:  Yes.    Falls?  "a few small ones" - states has drop foot on the L x 2 years and "im too old and they won't operate on me." Bradykinesia symptoms: slow movements and difficulty getting out of a chair Loss of smell:  No. Loss of taste:  No. Urinary Incontinence:  Frequency and some control issues Difficulty Swallowing:  No. Handwriting, micrographia: No. but shaky Trouble with ADL's:  No.  Trouble buttoning clothing: No. Depression:  Yes.  , mildly so Memory changes:  No. Hallucinations:  No.  visual distortions: No. N/V:  No. Lightheaded:  Yes.    Syncope: No. Diplopia:  No. Dyskinesia:  No.  Neuroimaging of the brain has not previously been performed.     ALLERGIES:  No Known Allergies  CURRENT MEDICATIONS:  Current Outpatient Medications  Medication Instructions  . acetaminophen (TYLENOL) 500-1,000 mg, Oral, 2 times daily  . ALPRAZolam (XANAX) 0.5 mg, Oral, Daily PRN  . amLODipine (NORVASC) 10 mg, Oral, Daily  . bismuth subsalicylate (PEPTO BISMOL) 262 MG/15ML suspension 30 mLs, Oral, Every 6 hours PRN  . dicyclomine (BENTYL) 10 mg,  Oral, 2 times daily  . docusate sodium (COLACE) 100 mg, Oral, Daily PRN  . levothyroxine (SYNTHROID) 75 mcg, Oral, Daily before breakfast  . neomycin-polymyxin b-dexamethasone (MAXITROL) 3.5-10000-0.1 SUSP 1 drop, Both Eyes, Daily PRN  . sertraline (ZOLOFT) 75 mg, Oral, Daily  . sildenafil (VIAGRA) 100 mg, Oral, Daily PRN, pulmonary hypertension  . tamsulosin (FLOMAX) 0.4 mg, Oral, Daily  . zaleplon (SONATA) 5 mg, Oral, At bedtime PRN    Objective:   PHYSICAL EXAMINATION:    VITALS:   Vitals:   03/30/20 0941 03/30/20 0947  BP: 90/64 (!) 84/52  Pulse: 87   SpO2: 98%   Weight: 168 lb (76.2 kg)   Height: 5\' 9"  (1.753 m)     GEN:  The patient appears stated age and is in NAD. HEENT:   Normocephalic, atraumatic.  The mucous membranes are moist. The superficial temporal arteries are without ropiness or tenderness. CV:  RRR Lungs:  CTAB Neck/HEME:  There are no carotid bruits bilaterally.  Neurological examination:  Orientation: The patient is alert and oriented x3.  Cranial nerves: There is good facial symmetry.  Extraocular muscles are intact. The visual fields are full to confrontational testing. The speech is fluent and clear. Soft palate rises symmetrically and there is no tongue deviation. Hearing is intact to conversational tone. Sensation: Sensation is intact to light touch throughout (facial, trunk, extremities). Vibration is intact at the bilateral big toe. There is no extinction with double simultaneous stimulation.  Motor: Strength is 5/5 in the bilateral upper and lower extremities with the exception of ankle dorsiflexion on the left (chronic left foot drop).   Shoulder shrug is equal and symmetric.  There is no pronator drift. Deep tendon reflexes: Deep tendon reflexes are 2/4 at the bilateral biceps, triceps, brachioradialis, patella and trace at the bilateral achilles. Plantar responses are downgoing bilaterally.  Movement examination: Tone: There is normal tone in the bilateral upper extremities.  The tone in the lower extremities is normal.  Abnormal movements: There is no rest tremor, even with distraction procedures.  There is mild postural tremor.  There is mild intention tremor.  He has no trouble with Archimedes spirals.  Minimal trouble pouring water from 1 glass to another. Coordination:  There is no decremation with RAM's, in the upper or lower extremities. Gait and Station: The patient pushes off to arise.  He has a marching type day on the left because of left foot drop.  He has good arm swing.  He has good stride length.  I have reviewed and interpreted the following labs independently   Chemistry      Component Value Date/Time   NA 135 03/06/2020  0733   K 4.1 03/06/2020 0733   CL 103 03/06/2020 0733   CO2 23 03/06/2020 0733   BUN 25 (H) 03/06/2020 0733   CREATININE 0.75 03/06/2020 0733   CREATININE 1.12 08/02/2011 1022      Component Value Date/Time   CALCIUM 8.9 03/06/2020 0733   ALKPHOS 56 01/12/2020 1326   AST 35 01/12/2020 1326   ALT 28 01/12/2020 1326   BILITOT 0.6 01/12/2020 1326      Lab Results  Component Value Date   TSH 1.790 01/12/2020   Lab Results  Component Value Date   WBC 15.3 (H) 03/06/2020   HGB 12.9 (L) 03/06/2020   HCT 38.1 (L) 03/06/2020   MCV 96.2 03/06/2020   PLT 218 03/06/2020      Total time spent on today's visit was 45 minutes,  including both face-to-face time and nonface-to-face time.  Time included that spent on review of records (prior notes available to me/labs/imaging if pertinent), discussing treatment and goals, answering patient's questions and coordinating care.  Cc:  Deland Pretty, MD

## 2020-03-29 ENCOUNTER — Ambulatory Visit: Payer: Medicare Other | Admitting: Neurology

## 2020-03-30 ENCOUNTER — Other Ambulatory Visit: Payer: Self-pay

## 2020-03-30 ENCOUNTER — Encounter: Payer: Self-pay | Admitting: Neurology

## 2020-03-30 ENCOUNTER — Ambulatory Visit: Payer: Medicare Other | Admitting: Neurology

## 2020-03-30 VITALS — BP 84/52 | HR 87 | Ht 69.0 in | Wt 168.0 lb

## 2020-03-30 DIAGNOSIS — M21372 Foot drop, left foot: Secondary | ICD-10-CM

## 2020-03-30 DIAGNOSIS — G250 Essential tremor: Secondary | ICD-10-CM

## 2020-03-30 DIAGNOSIS — I952 Hypotension due to drugs: Secondary | ICD-10-CM

## 2020-03-30 NOTE — Patient Instructions (Signed)
1.  Talk to your PCP about your blood pressure.  However, it could be low because you doubled up on your tamsulosin today.  2.  Let me know if you have new/worsening symptoms before next visit.  The physicians and staff at [facility] are committed to providing excellent care. You may receive a survey requesting feedback about your experience at our office. We strive to receive "very good" responses to the survey questions. If you feel that your experience would prevent you from giving the office a "very good " response, please contact our office to try to remedy the situation. We may be reached at [phone]. Thank you for taking the time out of your busy day to complete the survey.

## 2020-03-31 ENCOUNTER — Telehealth: Payer: Self-pay | Admitting: Neurology

## 2020-03-31 NOTE — Telephone Encounter (Signed)
Patient notified and voiced understanding.   Patient wanted to know if Dr Tat had any information or brochures on tremor.   Advised patient that I would ask Dr Tat and if we had one I'd mail him on and if we don't I wont. He voiced understanding.

## 2020-03-31 NOTE — Telephone Encounter (Signed)
I think that for right now, I would hold off on it given his age and degree of tremor (relatively mild).  Lets see what the next few months bring.[name] KitchenMarland Kitchen

## 2020-03-31 NOTE — Telephone Encounter (Signed)
Patient states that Dr Tat offered him a RX ( he does not know the name ) he would like to go ahead and get a RX for the medication sent to [facility]

## 2020-04-01 DIAGNOSIS — R0600 Dyspnea, unspecified: Secondary | ICD-10-CM | POA: Diagnosis not present

## 2020-04-01 DIAGNOSIS — Z7689 Persons encountering health services in other specified circumstances: Secondary | ICD-10-CM | POA: Diagnosis not present

## 2020-04-01 DIAGNOSIS — R0602 Shortness of breath: Secondary | ICD-10-CM | POA: Diagnosis not present

## 2020-04-01 DIAGNOSIS — N390 Urinary tract infection, site not specified: Secondary | ICD-10-CM | POA: Diagnosis not present

## 2020-04-01 DIAGNOSIS — R5383 Other fatigue: Secondary | ICD-10-CM | POA: Diagnosis not present

## 2020-04-01 NOTE — Telephone Encounter (Signed)
We used to.  You can look in the brochure area

## 2020-04-01 NOTE — Telephone Encounter (Signed)
Tremor brochure mailed to patient.

## 2020-04-02 DIAGNOSIS — H35371 Puckering of macula, right eye: Secondary | ICD-10-CM | POA: Diagnosis not present

## 2020-04-02 DIAGNOSIS — R3914 Feeling of incomplete bladder emptying: Secondary | ICD-10-CM | POA: Diagnosis not present

## 2020-04-02 DIAGNOSIS — H43813 Vitreous degeneration, bilateral: Secondary | ICD-10-CM | POA: Diagnosis not present

## 2020-04-02 DIAGNOSIS — H35431 Paving stone degeneration of retina, right eye: Secondary | ICD-10-CM | POA: Diagnosis not present

## 2020-04-02 DIAGNOSIS — H15832 Staphyloma posticum, left eye: Secondary | ICD-10-CM | POA: Diagnosis not present

## 2020-04-05 DIAGNOSIS — H401131 Primary open-angle glaucoma, bilateral, mild stage: Secondary | ICD-10-CM | POA: Diagnosis not present

## 2020-04-08 ENCOUNTER — Telehealth: Payer: Self-pay | Admitting: Neurology

## 2020-04-08 NOTE — Telephone Encounter (Signed)
Patient is requesting a "slight prescription" for his small tremor, if possible. He said his tremor embarrasses him and also he is tired a lot (not sure if related or not).  Maggie Font

## 2020-04-08 NOTE — Telephone Encounter (Signed)
Start primidone 50 mg - 1/2 tablet at bedtime for 1 week and then increase to 1 tablet at bedtime thereafter.

## 2020-04-12 MED ORDER — PRIMIDONE 50 MG PO TABS: ORAL_TABLET | ORAL | 0 refills | Status: DC

## 2020-04-12 NOTE — Telephone Encounter (Signed)
Hes going to need an appointment if he has lots of questions.  We can do VV on cx list.

## 2020-04-12 NOTE — Telephone Encounter (Signed)
Spoke with patient and gave him Dr Doristine Devoid recommendations. He voiced understanding but wanted to know if this was normal drug that Dr Tat prescribes and if this had anything to do with his glucose. He also wanted to know if this medication had any side effects.  Advised patient to reach out to his pcp in regards to his glucose and that I have not heard of any side effects for the primidone.   Advised that I would speak with Dr Tat about the side effects and if she had any suggestions I would give him a call back.   He voiced understanding.   Rx(s) sent to pharmacy electronically.

## 2020-04-13 NOTE — Telephone Encounter (Signed)
Spoke with patient who states he is open to a short chat with Dr Tat about the medication.   Advised him that a scheduler would be in touch with him about scheduling an appointment. He voiced understanding.

## 2020-04-13 NOTE — Progress Notes (Signed)
Virtual Visit Via Video   The purpose of this virtual visit is to provide medical care while limiting exposure to the novel coronavirus.    Consent was obtained for video visit:  Yes.   Answered questions that patient had about telehealth interaction:  Yes.   I discussed the limitations, risks, security and privacy concerns of performing an evaluation and management service by telemedicine. I also discussed with the patient that there may be a patient responsible charge related to this service. The patient expressed understanding and agreed to proceed.  Pt location: Home Physician Location: office Name of referring provider:  Deland Pretty, MD I connected with Marvin Jones. at patients initiation/request on 04/14/2020 at  9:15 AM EST by video enabled telemedicine application and verified that I am speaking with the correct person using two identifiers. Pt MRN:  [id number] Pt DOB:  [birth year] Video Participants:  Marvin Jones.;    Assessment/Plan:   1.  Tremor  -Characteristics most consistent with essential tremor, but tremor has not been going on all that long (although patient admits that timeline may not be right)  -Patient has called several times wanting medication and we called him in some low-dose primidone (although tremor is really mild), but today states that he really thinks that his tremulousness is more related to hypoglycemia.  This is really out of my area of expertise.  After a long discussion today, the patient ultimately decided to talk to his primary care physician tomorrow.  After that, he will decide whether or not he wants to start the primidone.  If so he will let me know and start primidone, 50 mg, half tablet at bedtime for 1 week and then increase to 1 tablet thereafter.  -Would like to follow him in the future just to make sure not developing a neurodegenerative process.  He does not meet criteria right now.  -Biggest source of loss of balance is chronic  foot drop on the left.  We discussed balance PT.  He is interested in doing that.  We will send a referral.  2.  Low blood pressure  -He is to following with PCP.  He understands that amlodipine and tamsulosin can be contributing.  Subjective   Patient seen today in follow-up at his request.  When I saw him last visit, just a few weeks ago, his tremor had characteristics most consistent with essential tremor, but tremor was really pretty mild and given age and degree of tremor, we decided to hold off on treatment.  He has contacted me several times since wanting treatment.  I initially told him that I wanted to take a wait-and-see approach, but after several other times that we were contacted, we sent a prescription for primidone.  He then had questions about tremor and the medication, so we decided to go ahead and schedule this visit to answer questions that he may have.  Pt states today that tremor isn't bothering him with most activities but his balance is bothering him mostly.  He states that that he thinks that he may have hypoglycemia and his real "shakiness" may have to deal with this.  States that when he eats, it helps this.  He states that PCP doesn't think that it is a BS issue but he is convinced that it isn't just tremor.  He feels worse in the AM before he starts eating.  He is trying to eat small meals.  Has appt with PCP to discuss  Current Outpatient Medications on File Prior to Visit  Medication Sig Dispense Refill  . acetaminophen (TYLENOL) 500 MG tablet Take 500-1,000 mg by mouth 2 (two) times daily.    [name] Kitchen ALPRAZolam (XANAX) 0.5 MG tablet Take 0.5 mg by mouth daily as needed for anxiety.    [name] Kitchen amLODipine (NORVASC) 10 MG tablet Take 10 mg by mouth daily.    [name] Kitchen bismuth subsalicylate (PEPTO BISMOL) 262 MG/15ML suspension Take 30 mLs by mouth every 6 (six) hours as needed for indigestion.     . dicyclomine (BENTYL) 10 MG capsule Take 1 capsule (10 mg total) by mouth in the morning  and at bedtime. (Patient taking differently: Take 10 mg by mouth daily as needed for spasms.) 180 capsule 1  . docusate sodium (COLACE) 100 MG capsule Take 1 capsule (100 mg total) by mouth daily as needed for up to 30 doses. 30 capsule 0  . levothyroxine (SYNTHROID) 75 MCG tablet Take 75 mcg by mouth daily before breakfast.    . neomycin-polymyxin b-dexamethasone (MAXITROL) 3.5-10000-0.1 SUSP Place 1 drop into both eyes daily as needed for allergies.    [name] Kitchen primidone (MYSOLINE) 50 MG tablet Take 0.5 tablets (25 mg total) by mouth at bedtime for 7 days, THEN 1 tablet (50 mg total) at bedtime. 34 tablet 0  . sertraline (ZOLOFT) 50 MG tablet Take 1.5 tablets (75 mg total) by mouth daily. 135 tablet 3  . sildenafil (VIAGRA) 100 MG tablet Take 100 mg by mouth daily as needed for erectile dysfunction. pulmonary hypertension    . tamsulosin (FLOMAX) 0.4 MG CAPS capsule Take 0.4 mg by mouth daily.    . zaleplon (SONATA) 5 MG capsule Take 5 mg by mouth at bedtime as needed for sleep.     No current facility-administered medications on file prior to visit.     Objective   There were no vitals filed for this visit. GEN:  The patient appears stated age and is in NAD.  Neurological examination:  Orientation: The patient is alert and oriented x3. Cranial nerves: There is good facial symmetry. There is no facial hypomimia.  The speech is fluent and clear. Soft palate rises symmetrically and there is no tongue deviation. Hearing is intact to conversational tone. Motor: Strength is at least antigravity x 4.   Shoulder shrug is equal and symmetric.  There is no pronator drift.  Movement examination: Tone: unable Abnormal movements: no rest tremor.  No significant postural or intention tremor Coordination:  There is no decremation with RAM's, in the UE bilaterally Gait and Station: unable.  Pt holding phone    Follow up Instructions      -I discussed the assessment and treatment plan with the  patient. The patient was provided an opportunity to ask questions and all were answered. The patient agreed with the plan and demonstrated an understanding of the instructions.   The patient was advised to call back or seek an in-person evaluation if the symptoms worsen or if the condition fails to improve as anticipated.    Total time spent on today's visit was 68minutes, including both face-to-face time and nonface-to-face time.  Time included that spent on review of records (prior notes available to me/labs/imaging if pertinent), discussing treatment and goals, answering patient's questions and coordinating care.   Alonza Bogus, DO

## 2020-04-14 ENCOUNTER — Encounter: Payer: Self-pay | Admitting: Neurology

## 2020-04-14 ENCOUNTER — Telehealth (INDEPENDENT_AMBULATORY_CARE_PROVIDER_SITE_OTHER): Payer: Medicare Other | Admitting: Neurology

## 2020-04-14 ENCOUNTER — Other Ambulatory Visit: Payer: Self-pay

## 2020-04-14 VITALS — Ht 69.0 in | Wt 165.0 lb

## 2020-04-14 DIAGNOSIS — R2689 Other abnormalities of gait and mobility: Secondary | ICD-10-CM

## 2020-04-14 DIAGNOSIS — M21372 Foot drop, left foot: Secondary | ICD-10-CM

## 2020-04-14 DIAGNOSIS — R251 Tremor, unspecified: Secondary | ICD-10-CM

## 2020-04-15 DIAGNOSIS — N390 Urinary tract infection, site not specified: Secondary | ICD-10-CM | POA: Diagnosis not present

## 2020-04-15 DIAGNOSIS — R0602 Shortness of breath: Secondary | ICD-10-CM | POA: Diagnosis not present

## 2020-04-15 DIAGNOSIS — R5383 Other fatigue: Secondary | ICD-10-CM | POA: Diagnosis not present

## 2020-04-20 DIAGNOSIS — R3914 Feeling of incomplete bladder emptying: Secondary | ICD-10-CM | POA: Diagnosis not present

## 2020-04-23 ENCOUNTER — Ambulatory Visit: Payer: Medicare Other | Admitting: Cardiology

## 2020-04-26 DIAGNOSIS — N390 Urinary tract infection, site not specified: Secondary | ICD-10-CM | POA: Diagnosis not present

## 2020-04-26 DIAGNOSIS — H401131 Primary open-angle glaucoma, bilateral, mild stage: Secondary | ICD-10-CM | POA: Diagnosis not present

## 2020-04-26 DIAGNOSIS — R3589 Other polyuria: Secondary | ICD-10-CM | POA: Diagnosis not present

## 2020-04-29 DIAGNOSIS — R8271 Bacteriuria: Secondary | ICD-10-CM | POA: Diagnosis not present

## 2020-04-30 DIAGNOSIS — N390 Urinary tract infection, site not specified: Secondary | ICD-10-CM | POA: Diagnosis not present

## 2020-05-04 ENCOUNTER — Ambulatory Visit: Payer: Medicare Other | Admitting: Cardiology

## 2020-05-04 DIAGNOSIS — N3090 Cystitis, unspecified without hematuria: Secondary | ICD-10-CM | POA: Diagnosis not present

## 2020-05-10 DIAGNOSIS — M48061 Spinal stenosis, lumbar region without neurogenic claudication: Secondary | ICD-10-CM | POA: Diagnosis not present

## 2020-05-10 DIAGNOSIS — G629 Polyneuropathy, unspecified: Secondary | ICD-10-CM | POA: Diagnosis not present

## 2020-05-10 DIAGNOSIS — I10 Essential (primary) hypertension: Secondary | ICD-10-CM | POA: Diagnosis not present

## 2020-05-10 DIAGNOSIS — Z741 Need for assistance with personal care: Secondary | ICD-10-CM | POA: Diagnosis not present

## 2020-05-10 DIAGNOSIS — M21372 Foot drop, left foot: Secondary | ICD-10-CM | POA: Diagnosis not present

## 2020-05-10 DIAGNOSIS — Z13828 Encounter for screening for other musculoskeletal disorder: Secondary | ICD-10-CM | POA: Diagnosis not present

## 2020-05-10 DIAGNOSIS — M810 Age-related osteoporosis without current pathological fracture: Secondary | ICD-10-CM | POA: Diagnosis not present

## 2020-05-10 DIAGNOSIS — R2681 Unsteadiness on feet: Secondary | ICD-10-CM | POA: Diagnosis not present

## 2020-05-10 DIAGNOSIS — I493 Ventricular premature depolarization: Secondary | ICD-10-CM | POA: Diagnosis not present

## 2020-05-10 DIAGNOSIS — K2270 Barrett's esophagus without dysplasia: Secondary | ICD-10-CM | POA: Diagnosis not present

## 2020-05-10 DIAGNOSIS — R202 Paresthesia of skin: Secondary | ICD-10-CM | POA: Diagnosis not present

## 2020-05-10 DIAGNOSIS — E039 Hypothyroidism, unspecified: Secondary | ICD-10-CM | POA: Diagnosis not present

## 2020-05-11 ENCOUNTER — Telehealth: Payer: Self-pay | Admitting: Internal Medicine

## 2020-05-12 DIAGNOSIS — R0683 Snoring: Secondary | ICD-10-CM | POA: Diagnosis not present

## 2020-05-12 DIAGNOSIS — R400 Somnolence: Secondary | ICD-10-CM | POA: Diagnosis not present

## 2020-05-12 DIAGNOSIS — M2550 Pain in unspecified joint: Secondary | ICD-10-CM | POA: Diagnosis not present

## 2020-05-12 NOTE — Telephone Encounter (Signed)
Pt scheduled to see Dr. Henrene Pastor 05/21/20@3 :40pm, he is aware of appt. Reports he is taking tylenol.

## 2020-05-14 DIAGNOSIS — N390 Urinary tract infection, site not specified: Secondary | ICD-10-CM | POA: Diagnosis not present

## 2020-05-14 DIAGNOSIS — R339 Retention of urine, unspecified: Secondary | ICD-10-CM | POA: Diagnosis not present

## 2020-05-14 DIAGNOSIS — N319 Neuromuscular dysfunction of bladder, unspecified: Secondary | ICD-10-CM | POA: Diagnosis not present

## 2020-05-17 DIAGNOSIS — F32A Depression, unspecified: Secondary | ICD-10-CM | POA: Diagnosis not present

## 2020-05-17 DIAGNOSIS — I10 Essential (primary) hypertension: Secondary | ICD-10-CM | POA: Diagnosis not present

## 2020-05-17 DIAGNOSIS — E039 Hypothyroidism, unspecified: Secondary | ICD-10-CM | POA: Diagnosis not present

## 2020-05-20 DIAGNOSIS — M170 Bilateral primary osteoarthritis of knee: Secondary | ICD-10-CM | POA: Diagnosis not present

## 2020-05-21 ENCOUNTER — Ambulatory Visit (INDEPENDENT_AMBULATORY_CARE_PROVIDER_SITE_OTHER): Payer: Medicare Other | Admitting: Internal Medicine

## 2020-05-21 ENCOUNTER — Encounter: Payer: Self-pay | Admitting: Internal Medicine

## 2020-05-21 VITALS — BP 120/60 | HR 80 | Ht 68.0 in | Wt 175.4 lb

## 2020-05-21 DIAGNOSIS — F32A Depression, unspecified: Secondary | ICD-10-CM

## 2020-05-21 DIAGNOSIS — K581 Irritable bowel syndrome with constipation: Secondary | ICD-10-CM

## 2020-05-21 DIAGNOSIS — F419 Anxiety disorder, unspecified: Secondary | ICD-10-CM

## 2020-05-21 DIAGNOSIS — K2100 Gastro-esophageal reflux disease with esophagitis, without bleeding: Secondary | ICD-10-CM

## 2020-05-21 MED ORDER — SERTRALINE HCL 100 MG PO TABS: 100.0000 mg | ORAL_TABLET | Freq: Every day | ORAL | 3 refills | Status: DC

## 2020-05-21 NOTE — Patient Instructions (Addendum)
It was a pleasure to see you today. Based on our discussion, I am providing you with my recommendations below:  RECOMMENDATION(S):    Continue Zoloft 100mg  every day. We will send a prescription to your mail in pharmacy for a 90 day supply with 3 refills  Take a stool softener daily  Take 1/2 tablet of your Xanax as opposed to a whole tablet  PRESCRIPTION MEDICATION(S):   We have sent the following medication(s) to your pharmacy:  . Zoloft 100mg   NOTE: If your medication(s) requires a PRIOR AUTHORIZATION, we will receive notification from your pharmacy. Once received, the process to submit for approval may take up to 7-10 business days. You will be contacted about any denials we have received from your insurance company as well as alternatives recommended by your provider.  FOLLOW UP:  . I would like for you to follow up with me in 3 months. Please call the office at [phone] to reschedule your appointment if you are unable to attend.  BMI:  . If you are age 76 or older, your body mass index should be between 23-30. Your Body mass index is 26.67 kg/m. If this is out of the aforementioned range listed, please consider follow up with your Primary Care Provider.  Thank you for trusting me with your gastrointestinal care!    Thornton Park, MD, MPH

## 2020-05-21 NOTE — Progress Notes (Signed)
HISTORY OF PRESENT ILLNESS:  Marvin Jones. is a 85 y.o. male who is followed in this office for GERD complicated by erosive esophagitis and peptic stricture, adenomatous colon polyps, chronic functional abdominal pain.  Patient presents today after contacting me last week complaining of severe fatigue.  He lost his wife, Marvin Jones, about 18 months ago, from ovarian cancer.  He is living alone.  He has difficulty sleeping.  He underwent TURP March 05, 2020.  Found to have adenocarcinoma within the prostate.  The procedure was complicated by infection for which he required antibiotics.  In terms of GERD, he takes his Protonix sporadically.  He does have some issues with indigestion.  I did increase his Zoloft from 75 mg daily to 100 mg daily.  He recently saw his PCP who adjusted blood pressure medication.  His recent TSH was normal at 1.79.  He does have occasional constipation and feels that stool softeners help.  He wonders if he might take these regularly.  He also takes Bentyl once daily and Pepto-Bismol on demand.  Marvin Jones is very social and has had difficulty after losing his wife in conjunction with pandemic imposed restrictions.  Also, his regular exercise facility has been closed for renovations.  On a positive note, he recently hired a housekeeper who has done a nice job in terms of Aeronautical engineer meals.  This helps his sense of wellbeing.  Also, he has met a male companion from [place] that he visits.  Terms of his abdomen.  He states that his bloating type pain and discomfort have been absent for about 2 weeks.  He really reports the same problems as he has historically terms of his gut.  REVIEW OF SYSTEMS:  All non-GI ROS negative unless otherwise stated in the HPI except for fatigue, hearing problems, excessive urination  Past Medical History:  Diagnosis Date  . Anxiety and depression    h/o  . Arthritis   . Barrett's esophagus   . Diaphragmatic hernia without  mention of obstruction or gangrene   . Diverticulosis of colon (without mention of hemorrhage)   . Esophageal reflux   . HTN (hypertension)   . Hyperlipidemia   . Hypertrophy of prostate with urinary obstruction and other lower urinary tract symptoms (LUTS)   . Irritable bowel syndrome   . Personal history of colonic polyps   . Skin cancer    sees Dr Marvin Jones   . Stricture and stenosis of esophagus     Past Surgical History:  Procedure Laterality Date  . CATARACT EXTRACTION, BILATERAL    . TONSILLECTOMY    . TRANSURETHRAL RESECTION OF PROSTATE N/A 03/05/2020   Procedure: TRANSURETHRAL RESECTION OF THE PROSTATE (TURP), BIPOLAR;  Surgeon: Marvin Lima, MD;  Location: [facility];  Service: Urology;  Laterality: N/A;    Social History Marvin Jones.  reports that he has quit smoking. His smoking use included cigars. He has never used smokeless tobacco. He reports current alcohol use. He reports that he does not use drugs.  family history includes Heart attack (age of onset: 44) in his father.  No Known Allergies     PHYSICAL EXAMINATION: Vital signs: BP 120/60 (BP Location: Left Arm, Patient Position: Sitting, Cuff Size: Normal)   Pulse 80   Ht _0  (1.727 m) Comment: height measured without shoes  Wt 175 lb 6 oz (79.5 kg)   BMI 26.67 kg/m   Constitutional: generally well-appearing, no acute distress Psychiatric: alert and oriented  x3, cooperative Eyes: extraocular movements intact, anicteric, conjunctiva pink Mouth: oral pharynx moist, no lesions Neck: supple no lymphadenopathy Cardiovascular: heart regular rate and rhythm, no murmur Lungs: clear to auscultation bilaterally Abdomen: soft, nontender, nondistended, no obvious ascites, no peritoneal signs, normal bowel sounds, no organomegaly Rectal: Omitted Extremities: no, cyanosis, or lower extremity edema bilaterally Skin: no lesions on visible extremities Neuro: No focal deficits.  Cranial nerves  intact  ASSESSMENT:  1.  Fatigue.  Multifactorial.  We discussed this 2.  Chronic functional abdominal pain.  Stable 3.  Recent TURP with complication of infection 4.  History of adenomatous colon polyps.  Last colonoscopy June 9355. 5.  GERD complicated by peptic stricture.  On PPI   PLAN:  1.  Continue Zoloft 100 mg daily.  Prescription refilled.  Medication side effects reviewed 2.  Advised to decrease the Xanax to 0.25 mg at night 3.  Continue pantoprazole daily 4.  Okay to use stool softeners daily 5.  Pepto-Bismol as needed 6.  Dicyclomine daily as needed 7.  Office follow-up in 3 months.  Contact me in the interim for questions or problems Total time of 35 minutes minutes was spent preparing to see the patient, obtaining history, performing physical exam, counseling regarding the above issues, ordering medications and follow-up, and documenting clinical information in the record

## 2020-05-27 DIAGNOSIS — E039 Hypothyroidism, unspecified: Secondary | ICD-10-CM | POA: Diagnosis not present

## 2020-05-27 DIAGNOSIS — R5383 Other fatigue: Secondary | ICD-10-CM | POA: Diagnosis not present

## 2020-05-27 DIAGNOSIS — I10 Essential (primary) hypertension: Secondary | ICD-10-CM | POA: Diagnosis not present

## 2020-05-27 DIAGNOSIS — E785 Hyperlipidemia, unspecified: Secondary | ICD-10-CM | POA: Diagnosis not present

## 2020-05-27 DIAGNOSIS — R634 Abnormal weight loss: Secondary | ICD-10-CM | POA: Diagnosis not present

## 2020-05-28 ENCOUNTER — Telehealth: Payer: Self-pay | Admitting: Cardiology

## 2020-05-28 NOTE — Telephone Encounter (Signed)
Spoke to patient he stated for the past several months he has been tired,shaky.No chest pain.He saw PCP and was advised to see Dr.Hochrein.Appointment scheduled with Dr.Hochrein 06/17/20 at 3:30 pm.

## 2020-05-28 NOTE — Telephone Encounter (Signed)
Called Marvin Jones to schedule urgent referral with Dr. Percival Jones or PA, but there is nothing available before 06/23/20. Marvin Jones requested this message be sent to see if he can be worked in due to this being urgent and being advised he needs an Echo ASAP. Please advise.

## 2020-06-01 DIAGNOSIS — R339 Retention of urine, unspecified: Secondary | ICD-10-CM | POA: Diagnosis not present

## 2020-06-11 ENCOUNTER — Telehealth: Payer: Self-pay | Admitting: Neurology

## 2020-06-11 NOTE — Telephone Encounter (Signed)
Called patient and left a message for a call back.  

## 2020-06-11 NOTE — Telephone Encounter (Signed)
Patient called in started taking his Primidone last night. He was supposed to tell Dr. Carles Collet when he started it. He wants to know if it is safe to take long term and will it start taking effect quickly? He wants to thank you in advance.

## 2020-06-11 NOTE — Telephone Encounter (Signed)
Give it a few weeks

## 2020-06-14 DIAGNOSIS — I10 Essential (primary) hypertension: Secondary | ICD-10-CM | POA: Diagnosis not present

## 2020-06-14 DIAGNOSIS — R634 Abnormal weight loss: Secondary | ICD-10-CM | POA: Diagnosis not present

## 2020-06-14 DIAGNOSIS — M7918 Myalgia, other site: Secondary | ICD-10-CM | POA: Diagnosis not present

## 2020-06-14 DIAGNOSIS — E039 Hypothyroidism, unspecified: Secondary | ICD-10-CM | POA: Diagnosis not present

## 2020-06-14 NOTE — Telephone Encounter (Signed)
Spoke with patient and gave him Dr Doristine Devoid recommendations. He voiced understanding.   He wants to know if its ok to take the Primidone and Xanax at night together?

## 2020-06-14 NOTE — Telephone Encounter (Signed)
Patient thanked me for calling and voiced understanding. He states he will take tylenol pm instead.

## 2020-06-14 NOTE — Telephone Encounter (Signed)
I don't recommend xanax in any patient of this age group (and he and I discussed that).  It doesn't interact with the primidone

## 2020-06-16 NOTE — Progress Notes (Signed)
Cardiology Office Note   Date:  06/17/2020   ID:  Marvin Jones., DOB [birth year], MRN [id number]  PCP:  Marvin Margarita, DO  Cardiologist:   Marvin Breeding, MD   Chief Complaint  Patient presents with  . Shortness of Breath      History of Present Illness: Marvin Jones. is a 85 y.o. male who presents for shortness of breath and hypertension  The patient presents for follow-up of shortness of breath.  I saw him a couple of years ago for this.  He had a treadmill without without any suggestion of ischemia.  He presents for follow-up.  He has had some problems with his blood pressure and has had to have losartan added recently and wanted to follow-up in particular for this.  He unfortunately cannot get around as well as he did because he has foot drop from a spinal problem as well as bad knees.  He is hoping to go swimming soon in the outdoor pool.  With his activities which includes walking slowly he denies any chest pressure, neck or arm discomfort.  He had no palpitations, presyncope or syncope.  He had no PND or orthopnea.   He said that his blood pressure started going up after a prostate surgery that got infected.  Starting on the Cozaar and starting to come down.  He brings a blood pressure diary and he is in the 742 systolic to 595 systolic range.  The diastolics are well controlled.   Past Medical History:  Diagnosis Date  . Anxiety and depression    h/o  . Arthritis   . Barrett's esophagus   . Diaphragmatic hernia without mention of obstruction or gangrene   . Diverticulosis of colon (without mention of hemorrhage)   . Esophageal reflux   . HTN (hypertension)   . Hyperlipidemia   . Hypertrophy of prostate with urinary obstruction and other lower urinary tract symptoms (LUTS)   . Irritable bowel syndrome   . Personal history of colonic polyps   . Skin cancer    sees Dr Marvin Jones   . Stricture and stenosis of esophagus     Past Surgical History:  Procedure  Laterality Date  . CATARACT EXTRACTION, BILATERAL    . TONSILLECTOMY    . TRANSURETHRAL RESECTION OF PROSTATE N/A 03/05/2020   Procedure: TRANSURETHRAL RESECTION OF THE PROSTATE (TURP), BIPOLAR;  Surgeon: Janith Lima, MD;  Location: [facility];  Service: Urology;  Laterality: N/A;     Current Outpatient Medications  Medication Sig Dispense Refill  . acetaminophen (TYLENOL) 500 MG tablet Take 500-1,000 mg by mouth 2 (two) times daily.    [name] Kitchen ALPRAZolam (XANAX) 0.5 MG tablet Take 0.5 mg by mouth daily as needed for anxiety.    [name] Kitchen amLODipine (NORVASC) 10 MG tablet Take 10 mg by mouth daily.    [name] Kitchen aspirin 81 MG chewable tablet Chew 1 tablet by mouth daily.    [name] Kitchen bismuth subsalicylate (PEPTO BISMOL) 262 MG/15ML suspension Take 30 mLs by mouth every 6 (six) hours as needed for indigestion.     . dicyclomine (BENTYL) 10 MG capsule Take 1 capsule (10 mg total) by mouth in the morning and at bedtime. (Patient taking differently: Take 10 mg by mouth daily as needed for spasms.) 180 capsule 1  . docusate sodium (COLACE) 100 MG capsule Take 1 capsule (100 mg total) by mouth daily as needed for up to 30 doses. 30 capsule 0  . finasteride (PROSCAR) 5 MG tablet  Take 5 mg by mouth daily.    [name] Kitchen levothyroxine (SYNTHROID) 75 MCG tablet Take 75 mcg by mouth daily before breakfast.    . losartan (COZAAR) 50 MG tablet Take 50 mg by mouth daily.    [name] Kitchen neomycin-polymyxin b-dexamethasone (MAXITROL) 3.5-10000-0.1 SUSP Place 1 drop into both eyes daily as needed for allergies.    . pantoprazole (PROTONIX) 40 MG tablet Take 40 mg by mouth as needed.    . sertraline (ZOLOFT) 100 MG tablet Take 1 tablet (100 mg total) by mouth daily. 90 tablet 3  . sildenafil (VIAGRA) 100 MG tablet Take 100 mg by mouth daily as needed for erectile dysfunction. pulmonary hypertension    . tamsulosin (FLOMAX) 0.4 MG CAPS capsule Take 0.4 mg by mouth daily.    . zaleplon (SONATA) 5 MG capsule Take 5 mg by mouth at bedtime as needed for sleep.      No current facility-administered medications for this visit.    Allergies:   Patient has no known allergies.    ROS:  Please see the history of present illness.   Otherwise, review of systems are positive for resting tremor.   All other systems are reviewed and negative.    PHYSICAL EXAM: VS:  BP (!) 145/70   Pulse 77   Ht 5\' 9"  (1.753 m)   Wt 176 lb (79.8 kg)   SpO2 97%   BMI 25.99 kg/m  , BMI Body mass index is 25.99 kg/m. GENERAL:  Well appearing NECK:  No jugular venous distention, waveform within normal limits, carotid upstroke brisk and symmetric, positive right bruits, no thyromegaly LUNGS:  Clear to auscultation bilaterally BACK:  No CVA tenderness CHEST:  Unremarkable HEART:  PMI not displaced or sustained,S1 and S2 within normal limits, no S3, no S4, no clicks, no rubs, no murmurs ABD:  Flat, positive bowel sounds normal in frequency in pitch, no bruits, no rebound, no guarding, no midline pulsatile mass, no hepatomegaly, no splenomegaly EXT:  2 plus pulses throughout, no edema, no cyanosis no clubbing   EKG:  EKG is ordered today. The ekg ordered today demonstrates sinus rhythm, rate 77, left axis deviation,   Recent Labs: 01/12/2020: ALT 28; TSH 1.790 03/06/2020: BUN 25; Creatinine, Ser 0.75; Hemoglobin 12.9; Platelets 218; Potassium 4.1; Sodium 135    Lipid Panel    Component Value Date/Time   CHOL 196 03/09/2014 0957   TRIG 160.0 (H) 03/09/2014 0957   HDL 45.00 03/09/2014 0957   CHOLHDL 4 03/09/2014 0957   VLDL 32.0 03/09/2014 0957   LDLCALC 119 (H) 03/09/2014 0957   LDLDIRECT 128.5 01/30/2011 1110      Wt Readings from Last 3 Encounters:  06/17/20 176 lb (79.8 kg)  05/21/20 175 lb 6 oz (79.5 kg)  04/14/20 165 lb (74.8 kg)      Other studies Reviewed: Additional studies/ records that were reviewed today include: Labs. Review of the above records demonstrates:  Please see elsewhere in the note.  See elsewhere   ASSESSMENT AND PLAN:  SOB:   This is baseline and not any different than when he had his negative treadmill test.  No further cardiovascular testing is suggested.  HTN: We will keep an eye on this.  If his blood pressure does not creep down as he gets more active I would suggest increasing his Cozaar to 75 400 mg daily.  BRUIT: I will order carotid Dopplers.  DYSLIPIDEMIA: Goals of therapy can also be based on evidence of any plaque he might have  in his carotid.  His LDL was 96 with an HDL of 50.  No change in therapy today.   Current medicines are reviewed at length with the patient today.  The patient does not have concerns regarding medicines.  The following changes have been made:  no change  Labs/ tests ordered today include:  Orders Placed This Encounter  Procedures  . EKG 12-Lead  . VAS US CAROTID     Disposition:   FU with me as needed.      Signed, Marvin Breeding, MD  06/17/2020 5:47 PM    [facility]

## 2020-06-17 ENCOUNTER — Other Ambulatory Visit: Payer: Self-pay

## 2020-06-17 ENCOUNTER — Encounter: Payer: Self-pay | Admitting: Cardiology

## 2020-06-17 ENCOUNTER — Ambulatory Visit (INDEPENDENT_AMBULATORY_CARE_PROVIDER_SITE_OTHER): Payer: Medicare Other | Admitting: Cardiology

## 2020-06-17 VITALS — BP 145/70 | HR 77 | Ht 69.0 in | Wt 176.0 lb

## 2020-06-17 DIAGNOSIS — I10 Essential (primary) hypertension: Secondary | ICD-10-CM | POA: Diagnosis not present

## 2020-06-17 DIAGNOSIS — R0602 Shortness of breath: Secondary | ICD-10-CM

## 2020-06-17 DIAGNOSIS — R0989 Other specified symptoms and signs involving the circulatory and respiratory systems: Secondary | ICD-10-CM | POA: Diagnosis not present

## 2020-06-17 DIAGNOSIS — E785 Hyperlipidemia, unspecified: Secondary | ICD-10-CM | POA: Diagnosis not present

## 2020-06-17 DIAGNOSIS — I493 Ventricular premature depolarization: Secondary | ICD-10-CM

## 2020-06-17 NOTE — Patient Instructions (Signed)
Medication Instructions:  Continue current medications  *If you need a refill on your cardiac medications before your next appointment, please call your pharmacy*   Lab Work: None Ordered  Testing/Procedures: Your physician has requested that you have a carotid duplex. This test is an ultrasound of the carotid arteries in your neck. It looks at blood flow through these arteries that supply the brain with blood. Allow one hour for this exam. There are no restrictions or special instructions.  Follow-Up: At [facility], you and your health needs are our priority.  As part of our continuing mission to provide you with exceptional heart care, we have created designated Provider Care Teams.  These Care Teams include your primary Cardiologist (physician) and Advanced Practice Providers (APPs -  Physician Assistants and Nurse Practitioners) who all work together to provide you with the care you need, when you need it.  We recommend signing up for the patient portal called "MyChart".  Sign up information is provided on this After Visit Summary.  MyChart is used to connect with patients for Virtual Visits (Telemedicine).  Patients are able to view lab/test results, encounter notes, upcoming appointments, etc.  Non-urgent messages can be sent to your provider as well.   To learn more about what you can do with MyChart, go to [email or web address].    Your next appointment:   As Needed

## 2020-06-25 ENCOUNTER — Ambulatory Visit (HOSPITAL_COMMUNITY)
Admission: RE | Admit: 2020-06-25 | Discharge: 2020-06-25 | Disposition: A | Discharge status: Home / Self Care | Payer: Medicare Other | Source: Ambulatory Visit | Attending: Cardiology | Admitting: Cardiology

## 2020-06-25 ENCOUNTER — Other Ambulatory Visit: Payer: Self-pay

## 2020-06-25 DIAGNOSIS — R0989 Other specified symptoms and signs involving the circulatory and respiratory systems: Secondary | ICD-10-CM | POA: Diagnosis not present

## 2020-06-28 ENCOUNTER — Ambulatory Visit: Payer: Medicare Other | Admitting: Podiatry

## 2020-06-29 ENCOUNTER — Ambulatory Visit: Payer: Medicare Other | Admitting: Podiatry

## 2020-06-29 DIAGNOSIS — R5383 Other fatigue: Secondary | ICD-10-CM | POA: Diagnosis not present

## 2020-06-29 DIAGNOSIS — E785 Hyperlipidemia, unspecified: Secondary | ICD-10-CM | POA: Diagnosis not present

## 2020-06-30 ENCOUNTER — Other Ambulatory Visit: Payer: Self-pay

## 2020-06-30 ENCOUNTER — Ambulatory Visit: Payer: Medicare Other | Admitting: Podiatry

## 2020-06-30 DIAGNOSIS — S90212A Contusion of left great toe with damage to nail, initial encounter: Secondary | ICD-10-CM | POA: Diagnosis not present

## 2020-07-02 ENCOUNTER — Ambulatory Visit: Payer: Medicare Other | Admitting: Podiatry

## 2020-07-02 ENCOUNTER — Encounter: Payer: Self-pay | Admitting: Podiatry

## 2020-07-02 DIAGNOSIS — R339 Retention of urine, unspecified: Secondary | ICD-10-CM | POA: Diagnosis not present

## 2020-07-02 NOTE — Progress Notes (Signed)
Subjective:  Patient ID: Marvin Eaton., male    DOB: [birth year],  MRN: [id number]  Chief Complaint  Patient presents with  . Nail Problem    Hallux nail has blood under the nail     85 y.o. male presents with the above complaint.  Patient presents with complaint left hallux nail contusion with small hematoma underneath the nail.  The hematoma appears to be about 20% of the nail involvement.  Patient does not really recall the injury to the area.  Patient states is not very painful he just wanted get evaluated make sure that it does not need to be drained or the nail removed.  He states been present for quite few weeks.  He denies seeing anyone else prior to seeing me.   Review of Systems: Negative except as noted in the HPI. Denies N/V/F/Ch.  Past Medical History:  Diagnosis Date  . Anxiety and depression    h/o  . Arthritis   . Barrett's esophagus   . Diaphragmatic hernia without mention of obstruction or gangrene   . Diverticulosis of colon (without mention of hemorrhage)   . Esophageal reflux   . HTN (hypertension)   . Hyperlipidemia   . Hypertrophy of prostate with urinary obstruction and other lower urinary tract symptoms (LUTS)   . Irritable bowel syndrome   . Personal history of colonic polyps   . Skin cancer    sees Dr Ronnald Ramp   . Stricture and stenosis of esophagus     Current Outpatient Medications:  .  acetaminophen (TYLENOL) 500 MG tablet, Take 500-1,000 mg by mouth 2 (two) times daily., Disp: , Rfl:  .  ALPRAZolam (XANAX) 0.5 MG tablet, Take 0.5 mg by mouth daily as needed for anxiety., Disp: , Rfl:  .  amLODipine (NORVASC) 10 MG tablet, Take 10 mg by mouth daily., Disp: , Rfl:  .  aspirin 81 MG chewable tablet, Chew 1 tablet by mouth daily., Disp: , Rfl:  .  bismuth subsalicylate (PEPTO BISMOL) 262 MG/15ML suspension, Take 30 mLs by mouth every 6 (six) hours as needed for indigestion. , Disp: , Rfl:  .  dicyclomine (BENTYL) 10 MG capsule, Take 1 capsule (10  mg total) by mouth in the morning and at bedtime. (Patient taking differently: Take 10 mg by mouth daily as needed for spasms.), Disp: 180 capsule, Rfl: 1 .  docusate sodium (COLACE) 100 MG capsule, Take 1 capsule (100 mg total) by mouth daily as needed for up to 30 doses., Disp: 30 capsule, Rfl: 0 .  finasteride (PROSCAR) 5 MG tablet, Take 5 mg by mouth daily., Disp: , Rfl:  .  levothyroxine (SYNTHROID) 75 MCG tablet, Take 75 mcg by mouth daily before breakfast., Disp: , Rfl:  .  losartan (COZAAR) 50 MG tablet, Take 50 mg by mouth daily., Disp: , Rfl:  .  neomycin-polymyxin b-dexamethasone (MAXITROL) 3.5-10000-0.1 SUSP, Place 1 drop into both eyes daily as needed for allergies., Disp: , Rfl:  .  pantoprazole (PROTONIX) 40 MG tablet, Take 40 mg by mouth as needed., Disp: , Rfl:  .  sertraline (ZOLOFT) 100 MG tablet, Take 1 tablet (100 mg total) by mouth daily., Disp: 90 tablet, Rfl: 3 .  sildenafil (VIAGRA) 100 MG tablet, Take 100 mg by mouth daily as needed for erectile dysfunction. pulmonary hypertension, Disp: , Rfl:  .  tamsulosin (FLOMAX) 0.4 MG CAPS capsule, Take 0.4 mg by mouth daily., Disp: , Rfl:  .  zaleplon (SONATA) 5 MG capsule, Take 5 mg  by mouth at bedtime as needed for sleep., Disp: , Rfl:   Social History   Tobacco Use  Smoking Status Former Smoker  . Types: Cigars  Smokeless Tobacco Never Used  Tobacco Comment   cigars, quit ~ 2005    No Known Allergies Objective:  There were no vitals filed for this visit. There is no height or weight on file to calculate BMI. Constitutional Well developed. Well nourished.  Vascular Dorsalis pedis pulses palpable bilaterally. Posterior tibial pulses palpable bilaterally. Capillary refill normal to all digits.  No cyanosis or clubbing noted. Pedal hair growth normal.  Neurologic Normal speech. Oriented to person, place, and time. Epicritic sensation to light touch grossly present bilaterally.  Dermatologic  small hematoma less  than 20% involvement of the nail noted to the left great toe.  Mild pain on palpation.  It has been present for a long period of time.  No active bleeding noted.  Orthopedic: Normal joint ROM without pain or crepitus bilaterally. No visible deformities. No bony tenderness.   Radiographs: None Assessment:   1. Subungual hematoma of great toe of left foot, initial encounter    Plan:  Patient was evaluated and treated and all questions answered.  Left hallux subungual hematoma -I explained to patient the etiology of hematoma and various treatment options were discussed.  At this time I believe that patient does not need to have the nail removed as it involves less than 20% of the nailbed.  I discussed with the patient to continue monitoring given that has been present for a month it and there may not be any active bleeding noted.  I discussed with the patient that eventually it would dissipate as the new nail continues to grow over a year.  He states understanding would like to hold off on removing the nail as well.  If any foot and ankle issues arise in the future if the hematoma is getting worse have asked him to come and see me.  He states understanding  No follow-ups on file.

## 2020-07-05 DIAGNOSIS — M7918 Myalgia, other site: Secondary | ICD-10-CM | POA: Diagnosis not present

## 2020-07-05 DIAGNOSIS — R634 Abnormal weight loss: Secondary | ICD-10-CM | POA: Diagnosis not present

## 2020-07-05 DIAGNOSIS — I10 Essential (primary) hypertension: Secondary | ICD-10-CM | POA: Diagnosis not present

## 2020-07-20 DIAGNOSIS — M810 Age-related osteoporosis without current pathological fracture: Secondary | ICD-10-CM | POA: Diagnosis not present

## 2020-07-20 DIAGNOSIS — I10 Essential (primary) hypertension: Secondary | ICD-10-CM | POA: Diagnosis not present

## 2020-07-20 DIAGNOSIS — E039 Hypothyroidism, unspecified: Secondary | ICD-10-CM | POA: Diagnosis not present

## 2020-07-30 DIAGNOSIS — I10 Essential (primary) hypertension: Secondary | ICD-10-CM | POA: Diagnosis not present

## 2020-07-30 DIAGNOSIS — M7918 Myalgia, other site: Secondary | ICD-10-CM | POA: Diagnosis not present

## 2020-07-30 DIAGNOSIS — R2681 Unsteadiness on feet: Secondary | ICD-10-CM | POA: Diagnosis not present

## 2020-07-30 DIAGNOSIS — I700 Atherosclerosis of aorta: Secondary | ICD-10-CM | POA: Diagnosis not present

## 2020-07-30 DIAGNOSIS — Z13828 Encounter for screening for other musculoskeletal disorder: Secondary | ICD-10-CM | POA: Diagnosis not present

## 2020-08-02 DIAGNOSIS — R339 Retention of urine, unspecified: Secondary | ICD-10-CM | POA: Diagnosis not present

## 2020-08-09 DIAGNOSIS — Z87440 Personal history of urinary (tract) infections: Secondary | ICD-10-CM | POA: Diagnosis not present

## 2020-08-11 ENCOUNTER — Telehealth: Payer: Self-pay | Admitting: Neurology

## 2020-08-11 NOTE — Telephone Encounter (Signed)
Rodert would like to know if he can get a refill for his primidone 50. He has one pill left. he said it has helped some. He has an appt 09/16/20 [phone]

## 2020-08-12 MED ORDER — PRIMIDONE 50 MG PO TABS: 50.0000 mg | ORAL_TABLET | Freq: Every day | ORAL | 0 refills | Status: DC

## 2020-08-12 NOTE — Telephone Encounter (Signed)
Called patient and informed him that his Rx has been sent to [facility]. Patient thanked up for our call.

## 2020-08-12 NOTE — Telephone Encounter (Signed)
It was [facility]

## 2020-08-18 DIAGNOSIS — M170 Bilateral primary osteoarthritis of knee: Secondary | ICD-10-CM | POA: Diagnosis not present

## 2020-08-19 DIAGNOSIS — E039 Hypothyroidism, unspecified: Secondary | ICD-10-CM | POA: Diagnosis not present

## 2020-08-19 DIAGNOSIS — M810 Age-related osteoporosis without current pathological fracture: Secondary | ICD-10-CM | POA: Diagnosis not present

## 2020-08-19 DIAGNOSIS — M159 Polyosteoarthritis, unspecified: Secondary | ICD-10-CM | POA: Diagnosis not present

## 2020-08-19 DIAGNOSIS — I10 Essential (primary) hypertension: Secondary | ICD-10-CM | POA: Diagnosis not present

## 2020-08-20 NOTE — Progress Notes (Signed)
Assessment/Plan:    1.  Essential Tremor  -Is really quite mild and not that bothersome (which is what I thought when I first saw him).  He only tried primidone for a few weeks and did not think it was that helpful (but patient is also reporting that he thought potentially it would be helpful for balance  2.  Probable PN  -getting ready to start PT  -recommended ambulatory assistive device.  -hold EMG for now per pt (and I agree)  -Recommended labs and he wanted to have those done at his primary care physician.  At his request, I printed out recommended labs, including B12, folate, RPR, SPEP/UPEP with immunofixation.  Discussed with patient that most peripheral neuropathy, if not due to diabetes or alcohol, is often idiopathic.  Discussed that balance therapy is really the best therapy for it if patients do not have significant paresthesias (he does not).  3.  Foot drop, L  -probable L5 radiculopathy.  Has been told by neurosx that surgery won't help  -wear AFO (has one at home) as I think that certainly contributes to the balance issue.  4.  Bradycardia  -wasn't when first came into the office with my medical assistant, but definitely was when I saw him, with heart rate ranging from the mid 50s to low 60s.  Told him to follow-up with primary care physician.  Potentially that is the reason why he is experiencing fatigue, although I told him that was just out of my area of expertise.  Subjective:   Marvin Jones. was seen today in follow up for tremor.  My previous records were reviewed prior to todays visit. Pt last seen on video visit, at which point he really wanted to hold on trying primidone as he thought perhaps his tremulousness was related to hypoglycemia.  He ended up calling back and wanting to trial the primidone, and he ended up starting it late April.  States that he took it for about 2 weeks and then d/c it as he wasn't sure it was helpful.  Does state that he is going to  see his PCP because he doesn't feel well in the AM.  He asks if ET makes you feel bad in the AM.  States that "I think that this is food related."  Admits to a few "minor falls."  He is going to go to a "balance class."    He states that he googled things and thinks that he has "IPS."  I asked him if he met idiopathic peripheral neuropathy but he was not sure.  Admits that "2 years ago I put a pin in my foot and I didn't feel it."  Admits to significant fatigue. Current prescribed movement disorder medications: Primidone, 50 mg at bedtime   ALLERGIES:  No Known Allergies  CURRENT MEDICATIONS:  Outpatient Encounter Medications as of 08/24/2020  Medication Sig   acetaminophen (TYLENOL) 500 MG tablet Take 500-1,000 mg by mouth 2 (two) times daily.   ALPRAZolam (XANAX) 0.5 MG tablet Take 0.5 mg by mouth daily as needed for anxiety.   amLODipine (NORVASC) 10 MG tablet Take 10 mg by mouth daily.   aspirin 81 MG chewable tablet Chew 1 tablet by mouth daily.   bismuth subsalicylate (PEPTO BISMOL) 262 MG/15ML suspension Take 30 mLs by mouth every 6 (six) hours as needed for indigestion.    dicyclomine (BENTYL) 10 MG capsule Take 1 capsule (10 mg total) by mouth in the morning and at  bedtime. (Patient taking differently: Take 10 mg by mouth daily as needed for spasms.)   docusate sodium (COLACE) 100 MG capsule Take 1 capsule (100 mg total) by mouth daily as needed for up to 30 doses.   finasteride (PROSCAR) 5 MG tablet Take 5 mg by mouth daily.   levothyroxine (SYNTHROID) 75 MCG tablet Take 75 mcg by mouth daily before breakfast.   losartan (COZAAR) 50 MG tablet Take 50 mg by mouth daily.   neomycin-polymyxin b-dexamethasone (MAXITROL) 3.5-10000-0.1 SUSP Place 1 drop into both eyes daily as needed for allergies.   pantoprazole (PROTONIX) 40 MG tablet Take 40 mg by mouth as needed.   primidone (MYSOLINE) 50 MG tablet Take 1 tablet (50 mg total) by mouth at bedtime.   sertraline (ZOLOFT) 100 MG tablet Take  1 tablet (100 mg total) by mouth daily.   sildenafil (VIAGRA) 100 MG tablet Take 100 mg by mouth daily as needed for erectile dysfunction. pulmonary hypertension   tamsulosin (FLOMAX) 0.4 MG CAPS capsule Take 0.4 mg by mouth daily.   zaleplon (SONATA) 5 MG capsule Take 5 mg by mouth at bedtime as needed for sleep.   No facility-administered encounter medications on file as of 08/24/2020.     Objective:    PHYSICAL EXAMINATION:    VITALS:   Vitals:   08/24/20 1311  BP: (!) 144/74  Pulse: 81  Temp: 98.1 F (36.7 C)  TempSrc: Oral  SpO2: 97%  Weight: 173 lb (78.5 kg)  Height: _0  (1.753 m)    GEN:  The patient appears stated age and is in NAD. HEENT:  Normocephalic, atraumatic.  The mucous membranes are moist. The superficial temporal arteries are without ropiness or tenderness. CV:  RRR Lungs:  CTAB Neck/HEME:  There are no carotid bruits bilaterally.  Neurological examination:  Orientation: The patient is alert and oriented x3. Cranial nerves: There is good facial symmetry. The speech is fluent and clear. Soft palate rises symmetrically and there is no tongue deviation. Hearing is intact to conversational tone. Sensation: Sensation is intact to light touch throughout.  He has decreased vibration distally. Deep tendon reflexes: 1/4 at the bilateral biceps, triceps, brachioradialis, patella Motor: Strength is at least antigravity x4.  Movement examination: Tone: There is normal tone in the UE/LE Abnormal movements: no rest tremor.  He has mild postural tremor.   Coordination:  There is no decremation with RAM's Gait and Station: The patient has mild difficulty arising out of a deep-seated chair without the use of the hands. The patient is very wide based and he has foot drop on the L and marches on that side b/c of that. I have reviewed and interpreted the following labs independently   Chemistry      Component Value Date/Time   NA 135 03/06/2020 0733   K 4.1  03/06/2020 0733   CL 103 03/06/2020 0733   CO2 23 03/06/2020 0733   BUN 25 (H) 03/06/2020 0733   CREATININE 0.75 03/06/2020 0733   CREATININE 1.12 08/02/2011 1022      Component Value Date/Time   CALCIUM 8.9 03/06/2020 0733   ALKPHOS 56 01/12/2020 1326   AST 35 01/12/2020 1326   ALT 28 01/12/2020 1326   BILITOT 0.6 01/12/2020 1326      Lab Results  Component Value Date   WBC 15.3 (H) 03/06/2020   HGB 12.9 (L) 03/06/2020   HCT 38.1 (L) 03/06/2020   MCV 96.2 03/06/2020   PLT 218 03/06/2020   Lab Results  Component Value Date   TSH 1.790 01/12/2020     Chemistry      Component Value Date/Time   NA 135 03/06/2020 0733   K 4.1 03/06/2020 0733   CL 103 03/06/2020 0733   CO2 23 03/06/2020 0733   BUN 25 (H) 03/06/2020 0733   CREATININE 0.75 03/06/2020 0733   CREATININE 1.12 08/02/2011 1022      Component Value Date/Time   CALCIUM 8.9 03/06/2020 0733   ALKPHOS 56 01/12/2020 1326   AST 35 01/12/2020 1326   ALT 28 01/12/2020 1326   BILITOT 0.6 01/12/2020 1326     Lab Results  Component Value Date   [id number] 098 12/25/2014       Total time spent on today's visit was 53mnutes, including both face-to-face time and nonface-to-face time.  Time included that spent on review of records (prior notes available to me/labs/imaging if pertinent), discussing treatment and goals, answering patient's questions and coordinating care.  Cc:  SSueanne Margarita DO

## 2020-08-24 ENCOUNTER — Other Ambulatory Visit: Payer: Self-pay

## 2020-08-24 ENCOUNTER — Encounter: Payer: Self-pay | Admitting: Neurology

## 2020-08-24 ENCOUNTER — Ambulatory Visit: Payer: Medicare Other | Admitting: Neurology

## 2020-08-24 VITALS — BP 144/74 | HR 61 | Temp 98.1°F | Ht 69.0 in | Wt 173.0 lb

## 2020-08-24 DIAGNOSIS — E538 Deficiency of other specified B group vitamins: Secondary | ICD-10-CM | POA: Diagnosis not present

## 2020-08-24 DIAGNOSIS — M21372 Foot drop, left foot: Secondary | ICD-10-CM

## 2020-08-24 DIAGNOSIS — R001 Bradycardia, unspecified: Secondary | ICD-10-CM | POA: Diagnosis not present

## 2020-08-24 DIAGNOSIS — R5383 Other fatigue: Secondary | ICD-10-CM | POA: Diagnosis not present

## 2020-08-24 DIAGNOSIS — G6289 Other specified polyneuropathies: Secondary | ICD-10-CM

## 2020-08-24 NOTE — Patient Instructions (Signed)
Get the labs done at your primary care doctor Use a cane or walker Use your AFO (that's the ankle brace) Your heart rate was a bit low in the office today.  You can ask your primary care physician if this could be the source of your fatigue.  I don't think that is a neurologic issue.

## 2020-08-30 DIAGNOSIS — E538 Deficiency of other specified B group vitamins: Secondary | ICD-10-CM | POA: Diagnosis not present

## 2020-08-30 DIAGNOSIS — G629 Polyneuropathy, unspecified: Secondary | ICD-10-CM | POA: Diagnosis not present

## 2020-08-30 DIAGNOSIS — I10 Essential (primary) hypertension: Secondary | ICD-10-CM | POA: Diagnosis not present

## 2020-08-30 DIAGNOSIS — R2681 Unsteadiness on feet: Secondary | ICD-10-CM | POA: Diagnosis not present

## 2020-08-30 DIAGNOSIS — R001 Bradycardia, unspecified: Secondary | ICD-10-CM | POA: Diagnosis not present

## 2020-08-30 DIAGNOSIS — F32A Depression, unspecified: Secondary | ICD-10-CM | POA: Diagnosis not present

## 2020-08-30 DIAGNOSIS — Z13828 Encounter for screening for other musculoskeletal disorder: Secondary | ICD-10-CM | POA: Diagnosis not present

## 2020-08-30 DIAGNOSIS — M7918 Myalgia, other site: Secondary | ICD-10-CM | POA: Diagnosis not present

## 2020-08-30 DIAGNOSIS — I700 Atherosclerosis of aorta: Secondary | ICD-10-CM | POA: Diagnosis not present

## 2020-08-30 DIAGNOSIS — G6289 Other specified polyneuropathies: Secondary | ICD-10-CM | POA: Diagnosis not present

## 2020-08-31 ENCOUNTER — Encounter: Payer: Self-pay | Admitting: Internal Medicine

## 2020-08-31 ENCOUNTER — Ambulatory Visit: Payer: Medicare Other | Admitting: Internal Medicine

## 2020-08-31 ENCOUNTER — Other Ambulatory Visit: Payer: Self-pay

## 2020-08-31 VITALS — BP 118/64 | HR 72 | Ht 69.0 in | Wt 168.4 lb

## 2020-08-31 DIAGNOSIS — K581 Irritable bowel syndrome with constipation: Secondary | ICD-10-CM

## 2020-08-31 DIAGNOSIS — F32A Depression, unspecified: Secondary | ICD-10-CM

## 2020-08-31 DIAGNOSIS — K2100 Gastro-esophageal reflux disease with esophagitis, without bleeding: Secondary | ICD-10-CM | POA: Diagnosis not present

## 2020-08-31 DIAGNOSIS — Z86010 Personal history of colonic polyps: Secondary | ICD-10-CM

## 2020-08-31 DIAGNOSIS — K589 Irritable bowel syndrome without diarrhea: Secondary | ICD-10-CM | POA: Diagnosis not present

## 2020-08-31 DIAGNOSIS — R109 Unspecified abdominal pain: Secondary | ICD-10-CM

## 2020-08-31 DIAGNOSIS — F419 Anxiety disorder, unspecified: Secondary | ICD-10-CM

## 2020-08-31 NOTE — Patient Instructions (Signed)
If you are age 83 or older, your body mass index should be between 23-30. Your Body mass index is 24.87 kg/m. If this is out of the aforementioned range listed, please consider follow up with your Primary Care Provider.  If you are age 87 or younger, your body mass index should be between 19-25. Your Body mass index is 24.87 kg/m. If this is out of the aformentioned range listed, please consider follow up with your Primary Care Provider.   __________________________________________________________  The [facility] providers would like to encourage you to use [facility] to communicate with providers for non-urgent requests or questions.  Due to long hold times on the telephone, sending your provider a message by [facility] may be a faster and more efficient way to get a response.  Please allow 48 business hours for a response.  Please remember that this is for non-urgent requests.   I will call you next month to schedule a follow up appointment in October

## 2020-08-31 NOTE — Progress Notes (Signed)
HISTORY OF PRESENT ILLNESS:  Marvin Jones. is a 85 y.o. male with past medical history as listed below presented and seen in this office for chronic functional abdominal pain/IBS.  Previous colonoscopy and upper endoscopy 2014.  He was last seen May 21, 2020 with multifactorial fatigue.  Also chronic functional abdominal pain.  He was status post TURP with infection January 2022.  Period has been complicated by peptic stricture.  For his reflux disease he takes pantoprazole 40 mg daily.  For his chronic functional abdominal complaints with anxiety/depression I placed him on Zoloft.  Currently on 100 mg daily.  Tolerating medication well.  He also takes 0.25 Xanax at night infrequently to help him with rest.  His PCP also prescribes Sonata which he uses infrequently.  He tells me that he tends to feel poorly in the morning.  Fatigue and lack of energy.  This improves throughout the course of the day.  He continues with his chronic lower abdominal discomfort.  He manages this with on-demand Pepto-Bismol and dicyclomine.  He request dicyclomine prescription refilled.  His appetite remains fair.  He has had some additional weight loss since his last visit.  He is starting to resume his routine of exercise and swimming.  He still grieves the loss of his wife.  REVIEW OF SYSTEMS:  All non-GI ROS negative unless otherwise stated in the HPI nocturia, fatigue  Past Medical History:  Diagnosis Date   Anxiety and depression    h/o   Arthritis    Barrett's esophagus    Diaphragmatic hernia without mention of obstruction or gangrene    Diverticulosis of colon (without mention of hemorrhage)    Esophageal reflux    HTN (hypertension)    Hyperlipidemia    Hypertrophy of prostate with urinary obstruction and other lower urinary tract symptoms (LUTS)    Irritable bowel syndrome    Personal history of colonic polyps    Skin cancer    sees Dr Ronnald Ramp    Stricture and stenosis of esophagus     Past  Surgical History:  Procedure Laterality Date   CATARACT EXTRACTION, BILATERAL     TONSILLECTOMY     TRANSURETHRAL RESECTION OF PROSTATE N/A 03/05/2020   Procedure: TRANSURETHRAL RESECTION OF THE PROSTATE (TURP), BIPOLAR;  Surgeon: Janith Lima, MD;  Location: [facility];  Service: Urology;  Laterality: N/A;    Social History Marvin Jones.  reports that he has quit smoking. His smoking use included cigars. He has never used smokeless tobacco. He reports current alcohol use. He reports that he does not use drugs.  family history includes Heart attack (age of onset: 37) in his father.  No Known Allergies     PHYSICAL EXAMINATION: Vital signs: BP 118/64   Pulse 72   Ht 5\' 9"  (1.753 m)   Wt 168 lb 6.4 oz (76.4 kg)   BMI 24.87 kg/m   Constitutional: generally well-appearing, no acute distress Psychiatric: alert and oriented x3, cooperative Eyes: extraocular movements intact, anicteric, conjunctiva pink Mouth: oral pharynx moist, no lesions Neck: supple no lymphadenopathy Cardiovascular: heart regular rate and rhythm, no murmur Lungs: clear to auscultation bilaterally Abdomen: soft, nontender, nondistended, no obvious ascites, no peritoneal signs, normal bowel sounds, no organomegaly Rectal: Omitted Extremities: no clubbing, cyanosis, or lower extremity edema bilaterally Skin: no lesions on visible extremities Neuro: No focal deficits.  Cranial nerves intact  ASSESSMENT:  1.  Multifactorial fatigue.  Ongoing 2.  Chronic functional abdominal pain.  Ongoing 3.  GERD complicated by peptic stricture.  Asymptomatic post dilation on PPI 4.  History of adenomatous colon polyps.  Last colonoscopy 2014   PLAN:  1.  Continue Zoloft 100 mg at night 2.  Continue pantoprazole daily 3.  Continue dicyclomine as needed for pain.  Prescription refilled. 4.  Reflux precautions 5.  Encouragement.  I have provided him my personal cell phone number and asked him to reach out if needed 6.   Otherwise, routine office follow-up 3 months A total time of 35 minutes was spent preparing to see the patient, obtaining comprehensive history, performing medically appropriate physical examination, counseling the patient regarding his above listed issues, ordering medications and follow-up, and documenting clinical information in the health record

## 2020-09-01 DIAGNOSIS — R339 Retention of urine, unspecified: Secondary | ICD-10-CM | POA: Diagnosis not present

## 2020-09-02 DIAGNOSIS — H401122 Primary open-angle glaucoma, left eye, moderate stage: Secondary | ICD-10-CM | POA: Diagnosis not present

## 2020-09-09 ENCOUNTER — Telehealth: Payer: Self-pay | Admitting: Internal Medicine

## 2020-09-09 NOTE — Telephone Encounter (Signed)
Patient called requested to speak with you said he has been having IBS for two weeks now and does not know if that is normal please advise.

## 2020-09-10 ENCOUNTER — Telehealth: Payer: Self-pay | Admitting: Neurology

## 2020-09-10 NOTE — Telephone Encounter (Signed)
Patient called for lab results from last week. They were done at [facility] and were to have been forwarded to Dr. Carles Collet.

## 2020-09-10 NOTE — Telephone Encounter (Signed)
Spoke with pt and he is still taking his dicyclomine and eating better, reports his IBS flare has been going on for about 10 days. Discussed with him sometimes they can last that long. DIscussed with pt that he may want to try IB guard that is OTC. Pt states he will try this also.

## 2020-09-13 NOTE — Telephone Encounter (Signed)
Called patient and informed him Per Dr. Carles Collet that his Spep looked fine and there was nothing further to do. Patient was happy to hear his results and thanked [place] for the call.   Patient had no further questions or concerns.

## 2020-09-13 NOTE — Telephone Encounter (Signed)
Patient called again to see if labs are ready for him.

## 2020-09-14 NOTE — Telephone Encounter (Signed)
Inbound call from patient requesting a call back to see if he have to take a test for blood in stool. Best contact number [phone]

## 2020-09-14 NOTE — Telephone Encounter (Signed)
Pt reports he has been having more issues with his IBS, he is taking his dicyclomine and states he is doing ok. He wants to know if Dr. Henrene Pastor feels he needs to do a stool test to see if there is blood in his stool. States he is 36 now and knows he isn't supposed to have a colon done but reports he wants to be cautious. Please advise.

## 2020-09-14 NOTE — Telephone Encounter (Signed)
Marvin Jones see note below from Dr. Henrene Pastor. Please schedule pt for a colonoscopy and a previsit. Thanks so much.

## 2020-09-14 NOTE — Telephone Encounter (Signed)
Patient has been scheduled for 12/06/20

## 2020-09-14 NOTE — Telephone Encounter (Signed)
Please tell Marvin Jones that I think it would be a good idea to schedule a colonoscopy.  Just to be cautious given his history of polyps and ongoing lower abdominal complaints.  I think this would be best.  Thanks

## 2020-09-15 ENCOUNTER — Telehealth: Payer: Self-pay | Admitting: Neurology

## 2020-09-15 NOTE — Telephone Encounter (Signed)
Pt would like a call back regarding his results. Said his doctor sent tat all his results and he would like to know what there were. Glucose etc. He said he knows his blood test was okay.

## 2020-09-16 ENCOUNTER — Ambulatory Visit: Payer: Medicare Other | Admitting: Neurology

## 2020-09-16 NOTE — Telephone Encounter (Signed)
Patient called again and left a message for recent lab results. He said he is a little nervous.

## 2020-09-17 NOTE — Telephone Encounter (Signed)
Called patient and he is aware of lab results.

## 2020-09-19 DIAGNOSIS — I10 Essential (primary) hypertension: Secondary | ICD-10-CM | POA: Diagnosis not present

## 2020-09-19 DIAGNOSIS — M810 Age-related osteoporosis without current pathological fracture: Secondary | ICD-10-CM | POA: Diagnosis not present

## 2020-09-19 DIAGNOSIS — E039 Hypothyroidism, unspecified: Secondary | ICD-10-CM | POA: Diagnosis not present

## 2020-09-19 DIAGNOSIS — M159 Polyosteoarthritis, unspecified: Secondary | ICD-10-CM | POA: Diagnosis not present

## 2020-09-20 ENCOUNTER — Telehealth: Payer: Self-pay | Admitting: Internal Medicine

## 2020-09-20 NOTE — Telephone Encounter (Signed)
I spoke with the pt and he wanted to know if he could do a home stool kit for colon cancer.  The pt has been advised that he is not a candidate for cologuard. He states he thought he could do hemoccult stool cards.  I let him know that would only test for blood not cancer.  He has an appt for previsit and colon in October and he will keep that as planned.

## 2020-09-21 ENCOUNTER — Telehealth: Payer: Self-pay | Admitting: Neurology

## 2020-09-21 NOTE — Telephone Encounter (Signed)
Pt called in stating he got some blood work done about 3 weeks ago and he wants to find out if it could be posted on mychart?

## 2020-09-21 NOTE — Telephone Encounter (Signed)
At his request, he had those labs at his PCP office.  Therefore its not on our mychart.  He has called several times for the results and we have told him it looked ok.  It should be on the portal of PCP since they ordered that but it won't show up on our portal since we didn't draw them.

## 2020-09-22 NOTE — Telephone Encounter (Signed)
Called patient and informed him that Dr Tat cannot put those labs on Mychart and that he will need to contact his PCP as they are the ones who did the labs. Patient stated that he will call them and also thanked me for calling him back.   Patient had no other questions or concerns.

## 2020-09-24 DIAGNOSIS — I10 Essential (primary) hypertension: Secondary | ICD-10-CM | POA: Diagnosis not present

## 2020-09-24 DIAGNOSIS — F32A Depression, unspecified: Secondary | ICD-10-CM | POA: Diagnosis not present

## 2020-09-24 DIAGNOSIS — R634 Abnormal weight loss: Secondary | ICD-10-CM | POA: Diagnosis not present

## 2020-09-24 DIAGNOSIS — R2681 Unsteadiness on feet: Secondary | ICD-10-CM | POA: Diagnosis not present

## 2020-09-24 DIAGNOSIS — G629 Polyneuropathy, unspecified: Secondary | ICD-10-CM | POA: Diagnosis not present

## 2020-09-24 DIAGNOSIS — R5383 Other fatigue: Secondary | ICD-10-CM | POA: Diagnosis not present

## 2020-09-24 DIAGNOSIS — E039 Hypothyroidism, unspecified: Secondary | ICD-10-CM | POA: Diagnosis not present

## 2020-09-24 DIAGNOSIS — R001 Bradycardia, unspecified: Secondary | ICD-10-CM | POA: Diagnosis not present

## 2020-09-24 DIAGNOSIS — I700 Atherosclerosis of aorta: Secondary | ICD-10-CM | POA: Diagnosis not present

## 2020-09-24 DIAGNOSIS — M7918 Myalgia, other site: Secondary | ICD-10-CM | POA: Diagnosis not present

## 2020-10-05 ENCOUNTER — Telehealth: Payer: Self-pay | Admitting: Internal Medicine

## 2020-10-05 NOTE — Telephone Encounter (Signed)
Pt states that he is having an increase in abdominal cramps. Pt stated that he has already taken  2 zoloft tablets this AM along with 2 Bentyl  tablets as well. Pt educated and encouraged only to take the prescribed amount. Pt stated that he has been drink lots of pepto bismal. Pt was also educated and encouraged to take the recommended amount.  Pt wants to know if the Bentyl dose can be increased or does he need an Alternate Medication. Pt also is questioning weather or not he needs an EGD. Please Advise

## 2020-10-05 NOTE — Telephone Encounter (Signed)
1.  Yes, tell him we will add on upper endoscopy.  There is room on my schedule that day.  Make his start time 2 PM (see my schedule). 2.  Have him pick up IBgard.  Take 1 twice daily.  See if this helps his abdominal complaints

## 2020-10-05 NOTE — Telephone Encounter (Signed)
Upper EGD added on to the  date that colon scheduled. Pt made aware. Pt instructed to take IBgard to help with the stomach cramps

## 2020-10-05 NOTE — Telephone Encounter (Signed)
Patient calling wants to know if the med Bentyl dose can be increased daily/or does he need alternate med. Pt states he is experiencing abd cramping more often. Pt asks does he need Endo procedure? Plz advise thank you

## 2020-10-06 ENCOUNTER — Telehealth: Payer: Self-pay | Admitting: Internal Medicine

## 2020-10-07 DIAGNOSIS — R339 Retention of urine, unspecified: Secondary | ICD-10-CM | POA: Diagnosis not present

## 2020-10-07 NOTE — Telephone Encounter (Signed)
Spoke with patient and reiterated that he can take 2 Bentyl a day and 2 IB Gard a day.  Patient agreed.

## 2020-10-07 NOTE — Telephone Encounter (Signed)
Patient calling back to get an update.  Please advise.

## 2020-10-08 DIAGNOSIS — I10 Essential (primary) hypertension: Secondary | ICD-10-CM | POA: Diagnosis not present

## 2020-10-08 DIAGNOSIS — R634 Abnormal weight loss: Secondary | ICD-10-CM | POA: Diagnosis not present

## 2020-10-08 DIAGNOSIS — E039 Hypothyroidism, unspecified: Secondary | ICD-10-CM | POA: Diagnosis not present

## 2020-10-08 DIAGNOSIS — R109 Unspecified abdominal pain: Secondary | ICD-10-CM | POA: Diagnosis not present

## 2020-10-10 ENCOUNTER — Telehealth: Payer: Self-pay | Admitting: Gastroenterology

## 2020-10-10 DIAGNOSIS — R109 Unspecified abdominal pain: Secondary | ICD-10-CM

## 2020-10-10 MED ORDER — HYOSCYAMINE SULFATE ER 0.375 MG PO TB12: 0.3750 mg | ORAL_TABLET | Freq: Two times a day (BID) | ORAL | Status: DC

## 2020-10-10 NOTE — Telephone Encounter (Signed)
Page received to on-call. Patient has been having abdominal cramping for last 10+ days. No change with trial of probiotics, IBGard, and most recently Bentyl. No other active issues, to include fever, inability to tolerate PO, change in stools, overt Gi bleeding, etc. Plan for the following:  - Trial Levbid 0.375 mg PO prn Q12H for abdominal cramping - Ok to resume probiotic and FDGard for now - Marvin Jones, can we please call to check in on patient tomorrow - Patient aware of ER return precautions

## 2020-10-11 NOTE — Telephone Encounter (Signed)
Noted. Thanks.

## 2020-10-11 NOTE — Telephone Encounter (Signed)
Spoke with patient who stated that the Levbid definitely helped his abdominal cramping.  He stated that he still has some mild cramping but after taking the Levbid he was able to get a good night sleep.  He continue to take the probiotic and the IB Chelsea and call us if gets worse.  He asked me to share with Dr. Bryan Lemma how much he appreciated his help.

## 2020-10-13 ENCOUNTER — Telehealth: Payer: Self-pay | Admitting: Podiatry

## 2020-10-13 ENCOUNTER — Other Ambulatory Visit: Payer: Self-pay | Admitting: Neurology

## 2020-10-13 NOTE — Telephone Encounter (Signed)
Pt left message today @ 127 stating his toe is still kind of sensitive and wanted to be seen today or tomorrow. His last appt was 5.13.22

## 2020-10-14 NOTE — Telephone Encounter (Signed)
Not sure what to tell him now.  Can you please help me?

## 2020-10-14 NOTE — Telephone Encounter (Signed)
Pt stated that the Levbid is helping his cramps. Pt stated that he has a appt with his PCP tomorrow.

## 2020-10-14 NOTE — Telephone Encounter (Signed)
Inbound call from patient. States he have been having extreme cramps. Says the medications helps but does not take it away. Best contact number [phone]

## 2020-10-15 ENCOUNTER — Telehealth: Payer: Self-pay | Admitting: Neurology

## 2020-10-15 NOTE — Telephone Encounter (Signed)
Patient called and left a voice mail requesting a call back from a nurse.   He reports concerns that his shakiness is more pronounced and seems to be progressing more rapidly.  .  Essential Tremor             -Is really quite mild and not that bothersome (which is what I thought when I first saw him).  He only tried primidone for a few weeks and did not think it was that helpful (but patient is also reporting that he thought potentially it would be helpful for balance He stated he will restart the prescription but did not state which medication.    Advised pt of last ov notes above, Per pt since that visit the tremors have gotten worse. He feels he did not give the medication enough time to work.      He wants to know if the is medication Dr.Tat uses often, and if it is a good idea to restart?  He'd like to know if this is normal with his illness. He is concerned.  Not urgent but he wanted to make sure this is normal he knows his DX is long term and noting well completely stop it but it's getting worse.

## 2020-10-15 NOTE — Telephone Encounter (Signed)
Pt advised of Dr.tat note,  I would not recommend starting.    He has an upcoming appt with Dr. Rexene Alberts at [facility] who also does movement and he can discuss with her as well.    Pt states understanding.  Pt in an appt right now.

## 2020-10-15 NOTE — Telephone Encounter (Signed)
Patient called and left a voice mail requesting a call back from a nurse.  He reports concerns that his shakiness is more pronounced and seems to be progressing more rapidly.  He stated he will restart the prescription but did not state which medication.  He'd like to know if this is normal with his illness. He is concerned.

## 2020-10-17 ENCOUNTER — Other Ambulatory Visit: Payer: Self-pay | Admitting: Gastroenterology

## 2020-10-19 DIAGNOSIS — I10 Essential (primary) hypertension: Secondary | ICD-10-CM | POA: Diagnosis not present

## 2020-10-20 DIAGNOSIS — E039 Hypothyroidism, unspecified: Secondary | ICD-10-CM | POA: Diagnosis not present

## 2020-10-20 DIAGNOSIS — I10 Essential (primary) hypertension: Secondary | ICD-10-CM | POA: Diagnosis not present

## 2020-10-20 DIAGNOSIS — M810 Age-related osteoporosis without current pathological fracture: Secondary | ICD-10-CM | POA: Diagnosis not present

## 2020-10-20 DIAGNOSIS — M159 Polyosteoarthritis, unspecified: Secondary | ICD-10-CM | POA: Diagnosis not present

## 2020-10-21 ENCOUNTER — Telehealth: Payer: Self-pay | Admitting: Internal Medicine

## 2020-10-21 NOTE — Telephone Encounter (Signed)
Pt stated that he has had black tarry stools for 10 days. Pt stated that he has been taking pepto Bismol for a while and just stopped yesterday as he has done research that states that [place] can cause dark stools. Pt was instructed to take kaopectate. Pt requested Office Visit. Pt scheduled for 10/27/20 @ 8:20. Pt made aware.

## 2020-10-21 NOTE — Telephone Encounter (Signed)
Inbound call from patient. States he is worried about have black tarry stool for weeks. Says Hyoscyamine is only helping with cramps. Best contact number [phone]

## 2020-10-26 DIAGNOSIS — N390 Urinary tract infection, site not specified: Secondary | ICD-10-CM | POA: Diagnosis not present

## 2020-10-27 ENCOUNTER — Ambulatory Visit: Payer: Medicare Other | Admitting: Internal Medicine

## 2020-10-27 ENCOUNTER — Encounter: Payer: Self-pay | Admitting: Internal Medicine

## 2020-10-27 VITALS — BP 100/60 | HR 80 | Ht 68.0 in | Wt 165.1 lb

## 2020-10-27 DIAGNOSIS — K2100 Gastro-esophageal reflux disease with esophagitis, without bleeding: Secondary | ICD-10-CM

## 2020-10-27 DIAGNOSIS — R109 Unspecified abdominal pain: Secondary | ICD-10-CM

## 2020-10-27 DIAGNOSIS — F32A Depression, unspecified: Secondary | ICD-10-CM | POA: Diagnosis not present

## 2020-10-27 DIAGNOSIS — Z86010 Personal history of colonic polyps: Secondary | ICD-10-CM

## 2020-10-27 DIAGNOSIS — F419 Anxiety disorder, unspecified: Secondary | ICD-10-CM

## 2020-10-27 DIAGNOSIS — K581 Irritable bowel syndrome with constipation: Secondary | ICD-10-CM | POA: Diagnosis not present

## 2020-10-27 MED ORDER — SUTAB 1479-225-188 MG PO TABS: 1.0000 | ORAL_TABLET | Freq: Once | ORAL | 0 refills | Status: AC

## 2020-10-27 NOTE — Patient Instructions (Signed)
If you are age 83 or older, your body mass index should be between 23-30. Your Body mass index is 25.11 kg/m. If this is out of the aforementioned range listed, please consider follow up with your Primary Care Provider.  If you are age 64 or younger, your body mass index should be between 19-25. Your Body mass index is 25.11 kg/m. If this is out of the aformentioned range listed, please consider follow up with your Primary Care Provider.   __________________________________________________________  The [facility] providers would like to encourage you to use [facility] to communicate with providers for non-urgent requests or questions.  Due to long hold times on the telephone, sending your provider a message by [facility] may be a faster and more efficient way to get a response.  Please allow 48 business hours for a response.  Please remember that this is for non-urgent requests.   You have been scheduled for a colonoscopy. Please follow written instructions given to you at your visit today.  Please pick up your prep supplies at the pharmacy within the next 1-3 days. If you use inhalers (even only as needed), please bring them with you on the day of your procedure.

## 2020-10-27 NOTE — Progress Notes (Signed)
HISTORY OF PRESENT ILLNESS:  Marvin Jones. is a 85 y.o. male with chronic functional abdominal pain, GERD complicated by peptic stricture, and a history of adenomatous colon polyps.  Patient presents today with complaints of abdominal pain, change in stool caliber, constipation, and concerns over cancer.  He was last evaluated August 31, 2020.  He was continued on Zoloft at night.  As well, pantoprazole daily.  Dicyclomine as needed for pain.  He since contacted the office with ongoing abdominal complaints, dark stools (had taken Pepto-Bismol), and other concerns.  Review of outside blood work from September 24, 2020 shows unremarkable comprehensive metabolic panel and CBC with hemoglobin 15.7.  His primary care provider prescribed BuSpar 7.5 mg twice daily.  He has been taking this sporadically but feels that it helps "calm him down".  He has continued on his Zoloft.  He has been using Colace for constipation.  He describes his stools as ball-like.  Again, he is concerned about cancer.  We did set him up for colonoscopy and upper endoscopy in mid October.  He again contacted the office and wished to be seen this morning.  He did have recurrent prostate infection and required antibiotics, which he just finished.  Advanced imaging in the form of CT scan of the abdomen and pelvis November 2021 to evaluate weight loss revealed no acute abnormalities.  His last colonoscopy was in 2014.  His last upper endoscopy also 2014.  REVIEW OF SYSTEMS:  All non-GI ROS negative unless otherwise stated in the HPI except for anxiety, fatigue  Past Medical History:  Diagnosis Date   Anxiety and depression    h/o   Arthritis    Barrett's esophagus    Diaphragmatic hernia without mention of obstruction or gangrene    Diverticulosis of colon (without mention of hemorrhage)    Esophageal reflux    HTN (hypertension)    Hyperlipidemia    Hypertrophy of prostate with urinary obstruction and other lower urinary tract  symptoms (LUTS)    Irritable bowel syndrome    Personal history of colonic polyps    Skin cancer    sees Dr Ronnald Ramp    Stricture and stenosis of esophagus     Past Surgical History:  Procedure Laterality Date   CATARACT EXTRACTION, BILATERAL     TONSILLECTOMY     TRANSURETHRAL RESECTION OF PROSTATE N/A 03/05/2020   Procedure: TRANSURETHRAL RESECTION OF THE PROSTATE (TURP), BIPOLAR;  Surgeon: Janith Lima, MD;  Location: [facility];  Service: Urology;  Laterality: N/A;    Social History Rydell Wender.  reports that he has quit smoking. His smoking use included cigars. He has never used smokeless tobacco. He reports current alcohol use. He reports that he does not use drugs.  family history includes Heart attack (age of onset: 83) in his father.  No Known Allergies     PHYSICAL EXAMINATION: Vital signs: BP 100/60 (BP Location: Left Arm, Patient Position: Sitting, Cuff Size: Normal)   Pulse 80   Ht '5\' 8"'$  (1.727 m)   Wt 165 lb 2 oz (74.9 kg)   BMI 25.11 kg/m   Constitutional: generally well-appearing, no acute distress Psychiatric: alert and oriented x3, cooperative Eyes: extraocular movements intact, anicteric, conjunctiva pink Mouth: oral pharynx moist, no lesions Neck: supple no lymphadenopathy Cardiovascular: heart regular rate and rhythm, no murmur Lungs: clear to auscultation bilaterally Abdomen: soft, nontender, nondistended, no obvious ascites, no peritoneal signs, normal bowel sounds, no organomegaly Rectal: Deferred until colonoscopy Extremities: no  clubbing, cyanosis, or lower extremity edema bilaterally Skin: no lesions on visible extremities Neuro: No focal deficits.  Cranial nerves intact  ASSESSMENT:  1.  Chronic functional abdominal pain.  Ongoing 2.  GERD complicated by erosive esophagitis and peptic stricture.  On PPI 3.  History of adenomatous colon polyps. 4.  Change in bowel habits 5.  Abdominal pain associated with constipation 6.   Anxiety/depression 7.  Health related anxiety   PLAN:  1.  Reflux precautions 2.  Continue pantoprazole 40 mg daily.  Refill as needed.  Medication risks reviewed 3.  Continue dicyclomine as needed.  Refill as needed.  Medication risks reviewed 4.  Continue Colace 5.  Continue Zoloft 100 mg at night.  Refill as needed.  Medication risks reviewed 6.  Try BuSpar once each morning (versus twice daily, he is concerned) 7.  Schedule colonoscopy to evaluate change in bowel habits in a patient with abdominal pain and history of multiple adenomatous colon polyps.The nature of the procedure, as well as the risks, benefits, and alternatives were carefully and thoroughly reviewed with the patient. Ample time for discussion and questions allowed. The patient understood, was satisfied, and agreed to proceed.  8.  Schedule upper endoscopy to evaluate abdominal pain in a patient with GERD, erosive esophagitis, and transient dark stools.The nature of the procedure, as well as the risks, benefits, and alternatives were carefully and thoroughly reviewed with the patient. Ample time for discussion and questions allowed. The patient understood, was satisfied, and agreed to proceed.

## 2020-10-29 ENCOUNTER — Telehealth: Payer: Self-pay | Admitting: Internal Medicine

## 2020-10-29 ENCOUNTER — Telehealth: Payer: Self-pay

## 2020-10-29 ENCOUNTER — Telehealth: Payer: Self-pay | Admitting: Gastroenterology

## 2020-10-29 MED ORDER — DICYCLOMINE HCL 10 MG PO CAPS: 10.0000 mg | ORAL_CAPSULE | Freq: Two times a day (BID) | ORAL | 0 refills | Status: DC

## 2020-10-29 NOTE — Telephone Encounter (Signed)
Patient should now be taking dicyclomine per Dr Pearletha Furl most recent note  10/27/20. Patient previously told us hyoscyamine only helped some with the cramping. Rx refills sent for dicyclomine.

## 2020-10-29 NOTE — Telephone Encounter (Signed)
Patient requesting refill on Hyoscyamine

## 2020-10-29 NOTE — Telephone Encounter (Signed)
Patient called requesting to speak with a nurse regarding colonoscopy and cologuards.

## 2020-10-29 NOTE — Telephone Encounter (Signed)
Pt notified of upcoming appt next week. Pt stated that all of his blood work from his PCP was in normal range

## 2020-10-29 NOTE — Telephone Encounter (Signed)
Please see other phone note

## 2020-11-01 ENCOUNTER — Ambulatory Visit (AMBULATORY_SURGERY_CENTER): Payer: Medicare Other | Admitting: Internal Medicine

## 2020-11-01 ENCOUNTER — Other Ambulatory Visit: Payer: Self-pay

## 2020-11-01 ENCOUNTER — Telehealth: Payer: Self-pay | Admitting: Internal Medicine

## 2020-11-01 ENCOUNTER — Encounter: Payer: Self-pay | Admitting: Internal Medicine

## 2020-11-01 VITALS — BP 139/61 | HR 51 | Temp 96.6°F | Resp 13 | Ht 68.0 in | Wt 165.0 lb

## 2020-11-01 DIAGNOSIS — D122 Benign neoplasm of ascending colon: Secondary | ICD-10-CM | POA: Diagnosis not present

## 2020-11-01 DIAGNOSIS — K648 Other hemorrhoids: Secondary | ICD-10-CM

## 2020-11-01 DIAGNOSIS — K581 Irritable bowel syndrome with constipation: Secondary | ICD-10-CM

## 2020-11-01 DIAGNOSIS — R194 Change in bowel habit: Secondary | ICD-10-CM | POA: Diagnosis not present

## 2020-11-01 DIAGNOSIS — R109 Unspecified abdominal pain: Secondary | ICD-10-CM

## 2020-11-01 DIAGNOSIS — Z86010 Personal history of colonic polyps: Secondary | ICD-10-CM

## 2020-11-01 MED ORDER — SODIUM CHLORIDE 0.9 % IV SOLN: 500.0000 mL | Freq: Once | INTRAVENOUS | Status: DC

## 2020-11-01 NOTE — Op Note (Signed)
Marvin Jones Patient Name: Marvin Jones Procedure Date: 11/01/2020 1:23 PM MRN: [id number] Endoscopist: Docia Chuck. Henrene Pastor , MD Age: 85 Referring MD:  Date of Birth: 05/08/1935 Gender: Male Account #: [id number] Procedure:                Colonoscopy with cold snare polypectomy x 3 Indications:              Abdominal pain, Weight loss, Incidental change in                            bowel habits noted. Also the personal history of                            nonadvanced adenomatous colon polyps. Previous                            examinations most recently 2014 Medicines:                Monitored Anesthesia Care Procedure:                Pre-Anesthesia Assessment:                           - Prior to the procedure, a History and Physical                            was performed, and patient medications and                            allergies were reviewed. The patient's tolerance of                            previous anesthesia was also reviewed. The risks                            and benefits of the procedure and the sedation                            options and risks were discussed with the patient.                            All questions were answered, and informed consent                            was obtained. Prior Anticoagulants: The patient has                            taken no previous anticoagulant or antiplatelet                            agents. ASA Grade Assessment: II - A patient with                            mild systemic disease. After reviewing the risks  and benefits, the patient was deemed in                            satisfactory condition to undergo the procedure.                           After obtaining informed consent, the colonoscope                            was passed under direct vision. Throughout the                            procedure, the patient's blood pressure, pulse, and                             oxygen saturations were monitored continuously. The                            CF [id number] was introduced through the anus                            and advanced to the the cecum, identified by                            appendiceal orifice and ileocecal valve. The                            ileocecal valve, appendiceal orifice, and rectum                            were photographed. The quality of the bowel                            preparation was good. The colonoscopy was performed                            without difficulty. The patient tolerated the                            procedure well. The bowel preparation used was                            SUPREP via split dose instruction. Scope In: 1:41:44 PM Scope Out: 1:58:42 PM Scope Withdrawal Time: 0 hours 11 minutes 23 seconds  Total Procedure Duration: 0 hours 16 minutes 58 seconds  Findings:                 Three polyps were found in the ascending colon. The                            polyps were 1 to 3 mm in size. These polyps were                            removed with a cold  snare. Resection and retrieval                            were complete.                           Internal hemorrhoids were found during                            retroflexion. The hemorrhoids were small.                           The exam was otherwise without abnormality on                            direct and retroflexion views. Complications:            No immediate complications. Estimated blood loss:                            None. Estimated Blood Loss:     Estimated blood loss: none. Impression:               - Three 1 to 3 mm polyps in the ascending colon,                            removed with a cold snare. Resected and retrieved.                           - Internal hemorrhoids.                           - The examination was otherwise normal on direct                            and retroflexion views. Recommendation:            - Repeat colonoscopy is not recommended for                            surveillance.                           - Patient has a contact number available for                            emergencies. The signs and symptoms of potential                            delayed complications were discussed with the                            patient. Return to normal activities tomorrow.                            Written discharge instructions were provided to the  patient.                           - Resume previous diet.                           - Continue present medications.                           - Await pathology results.                           - Please contact our office when you would like to                            schedule your upper endoscopy to further evaluate                            abdominal complaints Marvin Jones N. Henrene Pastor, MD 11/01/2020 2:06:02 PM This report has been signed electronically.

## 2020-11-01 NOTE — Progress Notes (Signed)
Called to room to assist during endoscopic procedure.  Patient ID and intended procedure confirmed with present staff. Received instructions for my participation in the procedure from the performing physician.  

## 2020-11-01 NOTE — Progress Notes (Signed)
Sedate, gd SR, tolerated procedure well, VSS, report to RN 

## 2020-11-01 NOTE — Progress Notes (Signed)
GI PREPROCEDURE H&P  Patient presents today for colonoscopy to evaluate abdominal pain, change in bowel habits, and history of colon polyps.  He was seen in the office October 27, 2020 and underwent comprehensive evaluation including H&P.  No interval changes.  See that note.

## 2020-11-01 NOTE — Progress Notes (Signed)
VS completed by DT.    Medical history reviewed and updated.  

## 2020-11-01 NOTE — Patient Instructions (Signed)
Handout given:  polyps Resume previous diet Continue current medications Await pathology results  YOU HAD AN ENDOSCOPIC PROCEDURE TODAY AT [place] [facility]:   Refer to the procedure report that was given to you for any specific questions about what was found during the examination.  If the procedure report does not answer your questions, please call your gastroenterologist to clarify.  If you requested that your care partner not be given the details of your procedure findings, then the procedure report has been included in a sealed envelope for you to review at your convenience later.  YOU SHOULD EXPECT: Some feelings of bloating in the abdomen. Passage of more gas than usual.  Walking can help get rid of the air that was put into your GI tract during the procedure and reduce the bloating. If you had a lower endoscopy (such as a colonoscopy or flexible sigmoidoscopy) you may notice spotting of blood in your stool or on the toilet paper. If you underwent a bowel prep for your procedure, you may not have a normal bowel movement for a few days.  Please Note:  You might notice some irritation and congestion in your nose or some drainage.  This is from the oxygen used during your procedure.  There is no need for concern and it should clear up in a day or so.  SYMPTOMS TO REPORT IMMEDIATELY:  Following lower endoscopy (colonoscopy or flexible sigmoidoscopy):  Excessive amounts of blood in the stool  Significant tenderness or worsening of abdominal pains  Swelling of the abdomen that is new, acute  Fever of 100F or higher  For urgent or emergent issues, a gastroenterologist can be reached at any hour by calling [phone]. Do not use MyChart messaging for urgent concerns.   DIET:  We do recommend a small meal at first, but then you may proceed to your regular diet.  Drink plenty of fluids but you should avoid alcoholic beverages for 24 hours.  ACTIVITY:  You should plan to take it  easy for the rest of today and you should NOT DRIVE or use heavy machinery until tomorrow (because of the sedation medicines used during the test).    FOLLOW UP: Our staff will call the number listed on your records 48-72 hours following your procedure to check on you and address any questions or concerns that you may have regarding the information given to you following your procedure. If we do not reach you, we will leave a message.  We will attempt to reach you two times.  During this call, we will ask if you have developed any symptoms of COVID 19. If you develop any symptoms (ie: fever, flu-like symptoms, shortness of breath, cough etc.) before then, please call [phone].  If you test positive for Covid 19 in the 2 weeks post procedure, please call and report this information to [place].    If any biopsies were taken you will be contacted by phone or by letter within the next 1-3 weeks.  Please call us at [phone] if you have not heard about the biopsies in 3 weeks.   SIGNATURES/CONFIDENTIALITY: You and/or your care partner have signed paperwork which will be entered into your electronic medical record.  These signatures attest to the fact that that the information above on your After Visit Summary has been reviewed and is understood.  Full responsibility of the confidentiality of this discharge information lies with you and/or your care-partner.

## 2020-11-01 NOTE — Telephone Encounter (Signed)
Hi Dr. Henrene Pastor, this patient just called to cancel EGD that was scheduled on 11/03/20 because he was not able to get the day off from work. Thank you.

## 2020-11-02 ENCOUNTER — Institutional Professional Consult (permissible substitution): Payer: Medicare Other | Admitting: Neurology

## 2020-11-03 ENCOUNTER — Telehealth: Payer: Self-pay | Admitting: *Deleted

## 2020-11-03 ENCOUNTER — Encounter: Payer: Medicare Other | Admitting: Internal Medicine

## 2020-11-03 NOTE — Telephone Encounter (Signed)
  Follow up Call-  Call back number 11/01/2020  Post procedure Call Back phone  # [phone]  Permission to leave phone message Yes  Some recent data might be hidden     Patient questions:  Do you have a fever, pain , or abdominal swelling? No. Pain Score  0 *  Have you tolerated food without any problems? Yes.    Have you been able to return to your normal activities? Yes.    Do you have any questions about your discharge instructions: Diet   No. Medications  No. Follow up visit  No.  Do you have questions or concerns about your Care? No.  Actions: * If pain score is 4 or above: No action needed, pain <4.

## 2020-11-04 ENCOUNTER — Encounter: Payer: Self-pay | Admitting: Internal Medicine

## 2020-11-08 ENCOUNTER — Other Ambulatory Visit: Payer: Self-pay

## 2020-11-08 ENCOUNTER — Ambulatory Visit: Payer: Medicare Other | Admitting: Cardiology

## 2020-11-08 DIAGNOSIS — R339 Retention of urine, unspecified: Secondary | ICD-10-CM | POA: Diagnosis not present

## 2020-11-10 DIAGNOSIS — E785 Hyperlipidemia, unspecified: Secondary | ICD-10-CM | POA: Diagnosis not present

## 2020-11-10 DIAGNOSIS — E039 Hypothyroidism, unspecified: Secondary | ICD-10-CM | POA: Diagnosis not present

## 2020-11-10 DIAGNOSIS — M810 Age-related osteoporosis without current pathological fracture: Secondary | ICD-10-CM | POA: Diagnosis not present

## 2020-11-16 ENCOUNTER — Telehealth: Payer: Self-pay | Admitting: Neurology

## 2020-11-16 DIAGNOSIS — Z0000 Encounter for general adult medical examination without abnormal findings: Secondary | ICD-10-CM | POA: Diagnosis not present

## 2020-11-16 DIAGNOSIS — R2681 Unsteadiness on feet: Secondary | ICD-10-CM | POA: Diagnosis not present

## 2020-11-16 DIAGNOSIS — R82998 Other abnormal findings in urine: Secondary | ICD-10-CM | POA: Diagnosis not present

## 2020-11-16 DIAGNOSIS — G629 Polyneuropathy, unspecified: Secondary | ICD-10-CM | POA: Diagnosis not present

## 2020-11-16 DIAGNOSIS — M7918 Myalgia, other site: Secondary | ICD-10-CM | POA: Diagnosis not present

## 2020-11-16 DIAGNOSIS — I10 Essential (primary) hypertension: Secondary | ICD-10-CM | POA: Diagnosis not present

## 2020-11-16 DIAGNOSIS — F32A Depression, unspecified: Secondary | ICD-10-CM | POA: Diagnosis not present

## 2020-11-16 DIAGNOSIS — I700 Atherosclerosis of aorta: Secondary | ICD-10-CM | POA: Diagnosis not present

## 2020-11-16 DIAGNOSIS — Z13828 Encounter for screening for other musculoskeletal disorder: Secondary | ICD-10-CM | POA: Diagnosis not present

## 2020-11-16 DIAGNOSIS — Z23 Encounter for immunization: Secondary | ICD-10-CM | POA: Diagnosis not present

## 2020-11-16 DIAGNOSIS — E785 Hyperlipidemia, unspecified: Secondary | ICD-10-CM | POA: Diagnosis not present

## 2020-11-16 DIAGNOSIS — R5383 Other fatigue: Secondary | ICD-10-CM | POA: Diagnosis not present

## 2020-11-16 NOTE — Telephone Encounter (Signed)
Pt left message on the VM stating that he would like to speak to Tat nurse about his tremor and his medication   Please call

## 2020-11-17 NOTE — Telephone Encounter (Signed)
Patient advised of rx denial for primidone, voiced understanding, he will follow up as scheduled. Thanked me for calling

## 2020-11-17 NOTE — Telephone Encounter (Signed)
Patient called in and stated he would like to see if he can get a prescription for Primidone for his tremor. It is getting worse and he would like to try this medication again. Send to [facility] on [address].

## 2020-11-17 NOTE — Telephone Encounter (Signed)
Line busy at 9:06

## 2020-11-17 NOTE — Telephone Encounter (Signed)
Rx is denied per Dr.Tat

## 2020-11-18 NOTE — Telephone Encounter (Signed)
Patient called back and scheduled for 11/23/20 with Dr. Carles Collet at 3:30 to discuss his worsening tremor. FYI only.

## 2020-11-19 DIAGNOSIS — I10 Essential (primary) hypertension: Secondary | ICD-10-CM | POA: Diagnosis not present

## 2020-11-22 ENCOUNTER — Telehealth: Payer: Self-pay | Admitting: Neurology

## 2020-11-22 DIAGNOSIS — N390 Urinary tract infection, site not specified: Secondary | ICD-10-CM | POA: Diagnosis not present

## 2020-11-22 NOTE — Progress Notes (Deleted)
Assessment/Plan:    1.  Essential Tremor  -Is really quite mild and not that bothersome (which is what I thought when I first saw him).  He only tried primidone for a few weeks and did not think it was that helpful (but patient is also reporting that he thought potentially it would be helpful for balance  2.  Probable PN  -getting ready to start PT  -recommended ambulatory assistive device.  -hold EMG for now per pt (and I agree)  -Recommended labs and he wanted to have those done at his primary care physician.  At his request, I printed out recommended labs, including B12, folate, RPR, SPEP/UPEP with immunofixation.  Discussed with patient that most peripheral neuropathy, if not due to diabetes or alcohol, is often idiopathic.  Discussed that balance therapy is really the best therapy for it if patients do not have significant paresthesias (he does not).  3.  Foot drop, L  -probable L5 radiculopathy.  Has been told by neurosx that surgery won't help  -wear AFO (has one at home) as I think that certainly contributes to the balance issue.  4.  Bradycardia  -wasn't when first came into the office with my medical assistant, but definitely was when I saw him, with heart rate ranging from the mid 50s to low 60s.  Told him to follow-up with primary care physician.  Potentially that is the reason why he is experiencing fatigue, although I told him that was just out of my area of expertise.  Subjective:   Marvin Jones. was seen today in follow up for tremor.  My previous records were reviewed prior to todays visit.  Multiple phone calls since last visit.  Wanted to restart primidone.  I was leery due to fact tremor not that significant and worry for cog status.   Current prescribed movement disorder medications: Primidone, 50 mg at bedtime   ALLERGIES:  No Known Allergies  CURRENT MEDICATIONS:  Outpatient Encounter Medications as of 11/23/2020  Medication Sig   acetaminophen  (TYLENOL) 500 MG tablet Take 500-1,000 mg by mouth 2 (two) times daily.   ALPRAZolam (XANAX) 0.5 MG tablet Take 0.5 mg by mouth daily as needed for anxiety.   amLODipine (NORVASC) 10 MG tablet Take 10 mg by mouth daily.   aspirin 81 MG chewable tablet Chew 1 tablet by mouth daily.   bismuth subsalicylate (PEPTO BISMOL) 262 MG/15ML suspension Take 30 mLs by mouth every 6 (six) hours as needed for indigestion.    busPIRone (BUSPAR) 7.5 MG tablet Take 7.5 mg by mouth 2 (two) times daily.   COMBIGAN 0.2-0.5 % ophthalmic solution Apply 1 drop to eye 2 (two) times daily.   dicyclomine (BENTYL) 10 MG capsule Take 1 capsule (10 mg total) by mouth in the morning and at bedtime.   docusate sodium (COLACE) 100 MG capsule Take 1 capsule (100 mg total) by mouth daily as needed for up to 30 doses.   latanoprost (XALATAN) 0.005 % ophthalmic solution Place 1 drop into both eyes at bedtime.   levothyroxine (SYNTHROID) 75 MCG tablet Take 75 mcg by mouth daily before breakfast.   losartan (COZAAR) 50 MG tablet Take 50 mg by mouth daily.   neomycin-polymyxin b-dexamethasone (MAXITROL) 3.5-10000-0.1 SUSP Place 1 drop into both eyes daily as needed for allergies.   pantoprazole (PROTONIX) 40 MG tablet Take 40 mg by mouth as needed.   sertraline (ZOLOFT) 100 MG tablet Take 1 tablet (100 mg total) by mouth daily.  sildenafil (VIAGRA) 100 MG tablet Take 100 mg by mouth daily as needed for erectile dysfunction. pulmonary hypertension   tamsulosin (FLOMAX) 0.4 MG CAPS capsule Take 0.4 mg by mouth daily.   Facility-Administered Encounter Medications as of 11/23/2020  Medication   hyoscyamine (LEVBID) 0.375 MG 12 hr tablet 0.375 mg     Objective:    PHYSICAL EXAMINATION:    VITALS:   There were no vitals filed for this visit.   GEN:  The patient appears stated age and is in NAD. HEENT:  Normocephalic, atraumatic.  The mucous membranes are moist. The superficial temporal arteries are without ropiness or  tenderness. CV:  RRR Lungs:  CTAB Neck/HEME:  There are no carotid bruits bilaterally.  Neurological examination:  Orientation: The patient is alert and oriented x3. Cranial nerves: There is good facial symmetry. The speech is fluent and clear. Soft palate rises symmetrically and there is no tongue deviation. Hearing is intact to conversational tone. Sensation: Sensation is intact to light touch throughout.  He has decreased vibration distally. Deep tendon reflexes: 1/4 at the bilateral biceps, triceps, brachioradialis, patella Motor: Strength is at least antigravity x4.  Movement examination: Tone: There is normal tone in the UE/LE Abnormal movements: no rest tremor.  He has mild postural tremor.   Coordination:  There is no decremation with RAM's Gait and Station: The patient has mild difficulty arising out of a deep-seated chair without the use of the hands. The patient is very wide based and he has foot drop on the L and marches on that side b/c of that. I have reviewed and interpreted the following labs independently   Chemistry      Component Value Date/Time   NA 135 03/06/2020 0733   K 4.1 03/06/2020 0733   CL 103 03/06/2020 0733   CO2 23 03/06/2020 0733   BUN 25 (H) 03/06/2020 0733   CREATININE 0.75 03/06/2020 0733   CREATININE 1.12 08/02/2011 1022      Component Value Date/Time   CALCIUM 8.9 03/06/2020 0733   ALKPHOS 56 01/12/2020 1326   AST 35 01/12/2020 1326   ALT 28 01/12/2020 1326   BILITOT 0.6 01/12/2020 1326      Lab Results  Component Value Date   WBC 15.3 (H) 03/06/2020   HGB 12.9 (L) 03/06/2020   HCT 38.1 (L) 03/06/2020   MCV 96.2 03/06/2020   PLT 218 03/06/2020   Lab Results  Component Value Date   TSH 1.790 01/12/2020     Chemistry      Component Value Date/Time   NA 135 03/06/2020 0733   K 4.1 03/06/2020 0733   CL 103 03/06/2020 0733   CO2 23 03/06/2020 0733   BUN 25 (H) 03/06/2020 0733   CREATININE 0.75 03/06/2020 0733   CREATININE  1.12 08/02/2011 1022      Component Value Date/Time   CALCIUM 8.9 03/06/2020 0733   ALKPHOS 56 01/12/2020 1326   AST 35 01/12/2020 1326   ALT 28 01/12/2020 1326   BILITOT 0.6 01/12/2020 1326     Lab Results  Component Value Date   [id number] 741 12/25/2014       Total time spent on today's visit was ***minutes, including both face-to-face time and nonface-to-face time.  Time included that spent on review of records (prior notes available to me/labs/imaging if pertinent), discussing treatment and goals, answering patient's questions and coordinating care.  Cc:  Sueanne Margarita, DO

## 2020-11-22 NOTE — Telephone Encounter (Signed)
I called pt to remind him of his appt. He said this past fri/sat he started feeling tire and weak. He called his pcp and they suggest to take something in case it was a UTI. They wanted him to go ahead and tackle it if it was. He said he is not in pain, but does feel bad.

## 2020-11-23 ENCOUNTER — Ambulatory Visit: Payer: Medicare Other | Admitting: Neurology

## 2020-12-03 DIAGNOSIS — Z87440 Personal history of urinary (tract) infections: Secondary | ICD-10-CM | POA: Diagnosis not present

## 2020-12-06 ENCOUNTER — Encounter: Payer: Medicare Other | Admitting: Internal Medicine

## 2020-12-06 ENCOUNTER — Ambulatory Visit: Payer: Medicare Other | Admitting: Cardiology

## 2020-12-08 DIAGNOSIS — R339 Retention of urine, unspecified: Secondary | ICD-10-CM | POA: Diagnosis not present

## 2020-12-10 DIAGNOSIS — Z87440 Personal history of urinary (tract) infections: Secondary | ICD-10-CM | POA: Diagnosis not present

## 2020-12-14 DIAGNOSIS — N3090 Cystitis, unspecified without hematuria: Secondary | ICD-10-CM | POA: Diagnosis not present

## 2020-12-23 DIAGNOSIS — H401122 Primary open-angle glaucoma, left eye, moderate stage: Secondary | ICD-10-CM | POA: Diagnosis not present

## 2020-12-29 DIAGNOSIS — N390 Urinary tract infection, site not specified: Secondary | ICD-10-CM | POA: Diagnosis not present

## 2020-12-30 NOTE — Progress Notes (Signed)
Assessment/Plan:    1.  Tremor  -As per previous notes, it is really so mild that I would not want to add further medications.  He tried low-dose primidone in the past and he did not think it was helpful.  I really do not want to restart that again given degree of tremor (incredibly mild)  -As below, patient somewhat focused on this issue (although not so much today), along with others within primary care notes.  In past, has called this office fairly frequently requiring reassurance and requesting work in appointments.  Wondering if memory could be playing a role here.  We discussed neurocognitive testing and he can follow-up with our memory PA following this if he would like.  He will think about this.  2.  Probable PN  -Lab work-up for reversible causes was negative. 3.  Foot drop, L  -probable L5 radiculopathy.  Has been told by neurosx that surgery won't help  -wear AFO (has one at home) as I think that certainly contributes to the balance issue.  4.  Fatigue, chronic  -Primary care records reviewed.  They have worked the patient up extensively for this and felt that causes have been multifactorial, including deconditioning, grieving the loss of his wife, age, etc.    Subjective:   [facility]. was seen today in follow up for tremor.  My previous records were reviewed prior to todays visit.  Patient has called several times since last visit regarding his tremor and felt that it had worsened.  He wanted to go back on primidone.  I really did not want to do that, as when I have seen him his tremor has really been very mild.  He was worked in for an appointment in October, but ended up not being able to come to that appointment because of urinary tract infection.  That has been a persistent issue according to the medical records.  He has been treated multiple times since our last visit, including just being diagnosed October 28.  As discussed last visit, he has seen primary care as  well as urology and complained about fatigue.  Primary care noted that patient appeared to be somewhat hyper fixated on the tremor.  They also noted that patient was calling their office asking a lot of the same questions and felt better with reassurance.    Pt states today that he "hopes" tremor is about the same.  No falls.  No near syncope.     Primidone, 50 mg at bedtime   ALLERGIES:  No Known Allergies  CURRENT MEDICATIONS:  Outpatient Encounter Medications as of 01/03/2021  Medication Sig   acetaminophen (TYLENOL) 500 MG tablet Take 500-1,000 mg by mouth 2 (two) times daily.   ALPRAZolam (XANAX) 0.5 MG tablet Take 0.5 mg by mouth daily as needed for anxiety.   amLODipine (NORVASC) 10 MG tablet Take 10 mg by mouth daily.   aspirin 81 MG chewable tablet Chew 1 tablet by mouth daily.   bismuth subsalicylate (PEPTO BISMOL) 262 MG/15ML suspension Take 30 mLs by mouth every 6 (six) hours as needed for indigestion.    busPIRone (BUSPAR) 7.5 MG tablet Take 7.5 mg by mouth 2 (two) times daily.   COMBIGAN 0.2-0.5 % ophthalmic solution Apply 1 drop to eye 2 (two) times daily.   dicyclomine (BENTYL) 10 MG capsule Take 1 capsule (10 mg total) by mouth in the morning and at bedtime.   docusate sodium (COLACE) 100 MG capsule Take 1  capsule (100 mg total) by mouth daily as needed for up to 30 doses.   latanoprost (XALATAN) 0.005 % ophthalmic solution Place 1 drop into both eyes at bedtime.   levothyroxine (SYNTHROID) 75 MCG tablet Take 75 mcg by mouth daily before breakfast.   losartan (COZAAR) 50 MG tablet Take 50 mg by mouth daily.   neomycin-polymyxin b-dexamethasone (MAXITROL) 3.5-10000-0.1 SUSP Place 1 drop into both eyes daily as needed for allergies.   pantoprazole (PROTONIX) 40 MG tablet Take 40 mg by mouth as needed.   sertraline (ZOLOFT) 100 MG tablet Take 1 tablet (100 mg total) by mouth daily.   sildenafil (VIAGRA) 100 MG tablet Take 100 mg by mouth daily as needed for erectile  dysfunction. pulmonary hypertension   tamsulosin (FLOMAX) 0.4 MG CAPS capsule Take 0.4 mg by mouth daily.   Facility-Administered Encounter Medications as of 01/03/2021  Medication   hyoscyamine (LEVBID) 0.375 MG 12 hr tablet 0.375 mg     Objective:    PHYSICAL EXAMINATION:    VITALS:   Vitals:   01/03/21 0859  BP: (!) 142/82  Pulse: 62  SpO2: 95%  Weight: 172 lb 9.6 oz (78.3 kg)  Height: 5\' 9"  (1.753 m)     GEN:  The patient appears stated age and is in NAD. HEENT:  Normocephalic, atraumatic.  The mucous membranes are moist. The superficial temporal arteries are without ropiness or tenderness. CV:  RRR Lungs:  CTAB Neck/HEME:  There are no carotid bruits bilaterally.  Neurological examination:  Orientation: The patient is alert and oriented x3. Cranial nerves: There is good facial symmetry. The speech is fluent and clear. Soft palate rises symmetrically and there is no tongue deviation. Hearing is intact to conversational tone. Sensation: Sensation is intact to light touch throughout.  He has decreased vibration distally. Deep tendon reflexes: 1/4 at the bilateral biceps, triceps, brachioradialis, patella Motor: Strength is at least antigravity x4.  Movement examination: Tone: There is normal tone in the UE/LE Abnormal movements: no rest tremor.  He has mild postural tremor.  No trouble pouring a water from one glass to another.  No trouble with archimedes spirals.   Coordination:  There is no decremation with RAM's Gait and Station: The patient has mild difficulty arising out of a deep-seated chair without the use of the hands. The patient is very wide based and he has foot drop on the L and marches on that side b/c of that. I have reviewed and interpreted the following labs independently   Chemistry      Component Value Date/Time   NA 135 03/06/2020 0733   K 4.1 03/06/2020 0733   CL 103 03/06/2020 0733   CO2 23 03/06/2020 0733   BUN 25 (H) 03/06/2020 0733    CREATININE 0.75 03/06/2020 0733   CREATININE 1.12 08/02/2011 1022      Component Value Date/Time   CALCIUM 8.9 03/06/2020 0733   ALKPHOS 56 01/12/2020 1326   AST 35 01/12/2020 1326   ALT 28 01/12/2020 1326   BILITOT 0.6 01/12/2020 1326      Lab Results  Component Value Date   WBC 15.3 (H) 03/06/2020   HGB 12.9 (L) 03/06/2020   HCT 38.1 (L) 03/06/2020   MCV 96.2 03/06/2020   PLT 218 03/06/2020   Lab Results  Component Value Date   TSH 1.790 01/12/2020     Chemistry      Component Value Date/Time   NA 135 03/06/2020 0733   K 4.1 03/06/2020 0733  CL 103 03/06/2020 0733   CO2 23 03/06/2020 0733   BUN 25 (H) 03/06/2020 0733   CREATININE 0.75 03/06/2020 0733   CREATININE 1.12 08/02/2011 1022      Component Value Date/Time   CALCIUM 8.9 03/06/2020 0733   ALKPHOS 56 01/12/2020 1326   AST 35 01/12/2020 1326   ALT 28 01/12/2020 1326   BILITOT 0.6 01/12/2020 1326     Lab Results  Component Value Date   ACGBKORJ08 569 12/25/2014       Total time spent on today's visit was 42minutes, including both face-to-face time and nonface-to-face time.  Time included that spent on review of records (prior notes available to me/labs/imaging if pertinent), discussing treatment and goals, answering patient's questions and coordinating care.  Cc:  Sueanne Margarita, DO

## 2021-01-03 ENCOUNTER — Other Ambulatory Visit: Payer: Self-pay

## 2021-01-03 ENCOUNTER — Encounter: Payer: Self-pay | Admitting: Neurology

## 2021-01-03 ENCOUNTER — Ambulatory Visit: Payer: Medicare Other | Admitting: Neurology

## 2021-01-03 VITALS — BP 142/82 | HR 62 | Ht 69.0 in | Wt 172.6 lb

## 2021-01-03 DIAGNOSIS — G250 Essential tremor: Secondary | ICD-10-CM

## 2021-01-03 DIAGNOSIS — R413 Other amnesia: Secondary | ICD-10-CM

## 2021-01-03 NOTE — Patient Instructions (Signed)
Essential Tremor °A tremor is trembling or shaking that a person cannot control. Most tremors affect the hands or arms. Tremors can also affect the head, vocal cords, legs, and other parts of the body. Essential tremor is a tremor without a known cause. Usually, it occurs while a person is trying to perform an action. It tends to get worse gradually as a person ages. °What are the causes? °The cause of this condition is not known. °What increases the risk? °You are more likely to develop this condition if: °You have a family member with essential tremor. °You are age 40 or older. °You take certain medicines. °What are the signs or symptoms? °The main sign of a tremor is a rhythmic shaking of certain parts of your body that is uncontrolled and unintentional. You may: °Have difficulty eating with a spoon or fork. °Have difficulty writing. °Nod your head up and down or side to side. °Have a quivering voice. °The shaking may: °Get worse over time. °Come and go. °Be more noticeable on one side of your body. °Get worse due to stress, fatigue, caffeine, and extreme heat or cold. °How is this diagnosed? °This condition may be diagnosed based on: °Your symptoms and medical history. °A physical exam. °There is no single test to diagnose an essential tremor. However, your health care provider may order tests to rule out other causes of your condition. These may include: °Blood and urine tests. °Imaging studies of your brain, such as CT scan and MRI. °A test that measures involuntary muscle movement (electromyogram). °How is this treated? °Treatment for essential tremor depends on the severity of the condition. °Some tremors may go away without treatment. °Mild tremors may not need treatment if they do not affect your day-to-day life. °Severe tremors may need to be treated using one or more of the following options: °Medicines. °Lifestyle changes. °Occupational or physical therapy. °Follow these instructions at  home: °Lifestyle ° °Do not use any products that contain nicotine or tobacco, such as cigarettes and e-cigarettes. If you need help quitting, ask your health care provider. °Limit your caffeine intake as told by your health care provider. °Try to get 8 hours of sleep each night. °Find ways to manage your stress that fits your lifestyle and personality. Consider trying meditation or yoga. °Try to anticipate stressful situations and allow extra time to manage them. °If you are struggling emotionally with the effects of your tremor, consider working with a mental health provider. °General instructions °Take over-the-counter and prescription medicines only as told by your health care provider. °Avoid extreme heat and extreme cold. °Keep all follow-up visits as told by your health care provider. This is important. Visits may include physical therapy visits. °Contact a health care provider if: °You experience any changes in the location or intensity of your tremors. °You start having a tremor after starting a new medicine. °You have tremor with other symptoms, such as: °Numbness. °Tingling. °Pain. °Weakness. °Your tremor gets worse. °Your tremor interferes with your daily life. °You feel down, blue, or sad for at least 2 weeks in a row. °Worrying about your tremor and what other people think about you interferes with your everyday life functions, including relationships, work, or school. °Summary °Essential tremor is a tremor without a known cause. Usually, it occurs when you are trying to perform an action. °You are more likely to develop this condition if you have a family member with essential tremor. °The main sign of a tremor is a rhythmic shaking of   certain parts of your body that is uncontrolled and unintentional. °Treatment for essential tremor depends on the severity of the condition. °This information is not intended to replace advice given to you by your health care provider. Make sure you discuss any questions  you have with your health care provider. °Document Revised: 10/29/2019 Document Reviewed: 10/31/2019 °Elsevier Patient Education © 2022 [employer] ° °

## 2021-01-07 DIAGNOSIS — Z87440 Personal history of urinary (tract) infections: Secondary | ICD-10-CM | POA: Diagnosis not present

## 2021-01-07 DIAGNOSIS — R339 Retention of urine, unspecified: Secondary | ICD-10-CM | POA: Diagnosis not present

## 2021-01-17 DIAGNOSIS — G629 Polyneuropathy, unspecified: Secondary | ICD-10-CM | POA: Diagnosis not present

## 2021-01-17 DIAGNOSIS — I700 Atherosclerosis of aorta: Secondary | ICD-10-CM | POA: Diagnosis not present

## 2021-01-17 DIAGNOSIS — R2681 Unsteadiness on feet: Secondary | ICD-10-CM | POA: Diagnosis not present

## 2021-01-17 DIAGNOSIS — I10 Essential (primary) hypertension: Secondary | ICD-10-CM | POA: Diagnosis not present

## 2021-01-17 DIAGNOSIS — E785 Hyperlipidemia, unspecified: Secondary | ICD-10-CM | POA: Diagnosis not present

## 2021-01-17 DIAGNOSIS — F32A Depression, unspecified: Secondary | ICD-10-CM | POA: Diagnosis not present

## 2021-01-17 DIAGNOSIS — M7918 Myalgia, other site: Secondary | ICD-10-CM | POA: Diagnosis not present

## 2021-01-17 DIAGNOSIS — R001 Bradycardia, unspecified: Secondary | ICD-10-CM | POA: Diagnosis not present

## 2021-01-19 DIAGNOSIS — I10 Essential (primary) hypertension: Secondary | ICD-10-CM | POA: Diagnosis not present

## 2021-01-19 DIAGNOSIS — L570 Actinic keratosis: Secondary | ICD-10-CM | POA: Diagnosis not present

## 2021-01-25 DIAGNOSIS — N390 Urinary tract infection, site not specified: Secondary | ICD-10-CM | POA: Diagnosis not present

## 2021-02-08 DIAGNOSIS — C44712 Basal cell carcinoma of skin of right lower limb, including hip: Secondary | ICD-10-CM | POA: Diagnosis not present

## 2021-02-09 DIAGNOSIS — R338 Other retention of urine: Secondary | ICD-10-CM | POA: Diagnosis not present

## 2021-02-16 DIAGNOSIS — R339 Retention of urine, unspecified: Secondary | ICD-10-CM | POA: Diagnosis not present

## 2021-02-22 DIAGNOSIS — N390 Urinary tract infection, site not specified: Secondary | ICD-10-CM | POA: Diagnosis not present

## 2021-03-04 DIAGNOSIS — N390 Urinary tract infection, site not specified: Secondary | ICD-10-CM | POA: Diagnosis not present

## 2021-03-04 DIAGNOSIS — Z87440 Personal history of urinary (tract) infections: Secondary | ICD-10-CM | POA: Diagnosis not present

## 2021-03-16 DIAGNOSIS — N390 Urinary tract infection, site not specified: Secondary | ICD-10-CM | POA: Diagnosis not present

## 2021-03-16 DIAGNOSIS — E785 Hyperlipidemia, unspecified: Secondary | ICD-10-CM | POA: Diagnosis not present

## 2021-03-16 DIAGNOSIS — R5383 Other fatigue: Secondary | ICD-10-CM | POA: Diagnosis not present

## 2021-03-16 DIAGNOSIS — R339 Retention of urine, unspecified: Secondary | ICD-10-CM | POA: Diagnosis not present

## 2021-03-16 DIAGNOSIS — E039 Hypothyroidism, unspecified: Secondary | ICD-10-CM | POA: Diagnosis not present

## 2021-03-17 ENCOUNTER — Ambulatory Visit: Payer: Medicare Other | Admitting: Neurology

## 2021-03-22 DIAGNOSIS — M7918 Myalgia, other site: Secondary | ICD-10-CM | POA: Diagnosis not present

## 2021-03-22 DIAGNOSIS — R109 Unspecified abdominal pain: Secondary | ICD-10-CM | POA: Diagnosis not present

## 2021-03-22 DIAGNOSIS — F32A Depression, unspecified: Secondary | ICD-10-CM | POA: Diagnosis not present

## 2021-03-22 DIAGNOSIS — R339 Retention of urine, unspecified: Secondary | ICD-10-CM | POA: Diagnosis not present

## 2021-03-22 DIAGNOSIS — R251 Tremor, unspecified: Secondary | ICD-10-CM | POA: Diagnosis not present

## 2021-03-22 DIAGNOSIS — I700 Atherosclerosis of aorta: Secondary | ICD-10-CM | POA: Diagnosis not present

## 2021-03-22 DIAGNOSIS — R2681 Unsteadiness on feet: Secondary | ICD-10-CM | POA: Diagnosis not present

## 2021-03-22 DIAGNOSIS — E785 Hyperlipidemia, unspecified: Secondary | ICD-10-CM | POA: Diagnosis not present

## 2021-03-24 DIAGNOSIS — N390 Urinary tract infection, site not specified: Secondary | ICD-10-CM | POA: Diagnosis not present

## 2021-03-28 DIAGNOSIS — N390 Urinary tract infection, site not specified: Secondary | ICD-10-CM | POA: Diagnosis not present

## 2021-04-07 DIAGNOSIS — E538 Deficiency of other specified B group vitamins: Secondary | ICD-10-CM | POA: Diagnosis not present

## 2021-04-08 DIAGNOSIS — R339 Retention of urine, unspecified: Secondary | ICD-10-CM | POA: Diagnosis not present

## 2021-04-08 DIAGNOSIS — N390 Urinary tract infection, site not specified: Secondary | ICD-10-CM | POA: Diagnosis not present

## 2021-04-12 DIAGNOSIS — N390 Urinary tract infection, site not specified: Secondary | ICD-10-CM | POA: Diagnosis not present

## 2021-04-13 DIAGNOSIS — R8271 Bacteriuria: Secondary | ICD-10-CM | POA: Diagnosis not present

## 2021-04-14 DIAGNOSIS — E785 Hyperlipidemia, unspecified: Secondary | ICD-10-CM | POA: Diagnosis not present

## 2021-04-14 DIAGNOSIS — R109 Unspecified abdominal pain: Secondary | ICD-10-CM | POA: Diagnosis not present

## 2021-04-14 DIAGNOSIS — R2681 Unsteadiness on feet: Secondary | ICD-10-CM | POA: Diagnosis not present

## 2021-04-14 DIAGNOSIS — G629 Polyneuropathy, unspecified: Secondary | ICD-10-CM | POA: Diagnosis not present

## 2021-04-14 DIAGNOSIS — I700 Atherosclerosis of aorta: Secondary | ICD-10-CM | POA: Diagnosis not present

## 2021-04-14 DIAGNOSIS — R251 Tremor, unspecified: Secondary | ICD-10-CM | POA: Diagnosis not present

## 2021-04-14 DIAGNOSIS — F32A Depression, unspecified: Secondary | ICD-10-CM | POA: Diagnosis not present

## 2021-04-14 DIAGNOSIS — M7918 Myalgia, other site: Secondary | ICD-10-CM | POA: Diagnosis not present

## 2021-04-14 DIAGNOSIS — R339 Retention of urine, unspecified: Secondary | ICD-10-CM | POA: Diagnosis not present

## 2021-04-21 DIAGNOSIS — R3914 Feeling of incomplete bladder emptying: Secondary | ICD-10-CM | POA: Diagnosis not present

## 2021-04-25 DIAGNOSIS — H5201 Hypermetropia, right eye: Secondary | ICD-10-CM | POA: Diagnosis not present

## 2021-04-25 DIAGNOSIS — H04123 Dry eye syndrome of bilateral lacrimal glands: Secondary | ICD-10-CM | POA: Diagnosis not present

## 2021-04-25 DIAGNOSIS — H401132 Primary open-angle glaucoma, bilateral, moderate stage: Secondary | ICD-10-CM | POA: Diagnosis not present

## 2021-04-25 DIAGNOSIS — Z961 Presence of intraocular lens: Secondary | ICD-10-CM | POA: Diagnosis not present

## 2021-04-26 DIAGNOSIS — N390 Urinary tract infection, site not specified: Secondary | ICD-10-CM | POA: Diagnosis not present

## 2021-05-06 DIAGNOSIS — N390 Urinary tract infection, site not specified: Secondary | ICD-10-CM | POA: Diagnosis not present

## 2021-05-10 DIAGNOSIS — R339 Retention of urine, unspecified: Secondary | ICD-10-CM | POA: Diagnosis not present

## 2021-05-11 DIAGNOSIS — M170 Bilateral primary osteoarthritis of knee: Secondary | ICD-10-CM | POA: Diagnosis not present

## 2021-05-18 ENCOUNTER — Other Ambulatory Visit: Payer: Self-pay

## 2021-05-18 ENCOUNTER — Telehealth: Payer: Self-pay

## 2021-05-18 DIAGNOSIS — F32A Depression, unspecified: Secondary | ICD-10-CM

## 2021-05-18 MED ORDER — SERTRALINE HCL 100 MG PO TABS: 100.0000 mg | ORAL_TABLET | Freq: Every day | ORAL | 4 refills | Status: DC

## 2021-05-18 NOTE — Telephone Encounter (Signed)
-----   Message from Irene Shipper, MD sent at 05/17/2021 12:06 PM EDT ----- ?Regarding: Medication refill ?This elderly patient left a message on my home telephone requesting medication refill.  He is an acquaintance.  Not sure why he did not contact the office.  In any event, ? ?1.  Please refill Zoloft 100 mg p.o. nightly; #90; 4 refills.  [facility] ?2.  Please call the patient and let him know that I received his message and that you are refilling his medication. ? ?Thank you, ? ?Dr. Henrene Pastor ? ?

## 2021-05-18 NOTE — Telephone Encounter (Signed)
Refills have been ordered, patient is aware.  ?

## 2021-05-19 DIAGNOSIS — E039 Hypothyroidism, unspecified: Secondary | ICD-10-CM | POA: Diagnosis not present

## 2021-05-19 DIAGNOSIS — F32A Depression, unspecified: Secondary | ICD-10-CM | POA: Diagnosis not present

## 2021-05-19 DIAGNOSIS — R5383 Other fatigue: Secondary | ICD-10-CM | POA: Diagnosis not present

## 2021-05-19 DIAGNOSIS — R109 Unspecified abdominal pain: Secondary | ICD-10-CM | POA: Diagnosis not present

## 2021-05-19 DIAGNOSIS — E785 Hyperlipidemia, unspecified: Secondary | ICD-10-CM | POA: Diagnosis not present

## 2021-05-19 DIAGNOSIS — M7918 Myalgia, other site: Secondary | ICD-10-CM | POA: Diagnosis not present

## 2021-05-19 DIAGNOSIS — R339 Retention of urine, unspecified: Secondary | ICD-10-CM | POA: Diagnosis not present

## 2021-05-19 DIAGNOSIS — I10 Essential (primary) hypertension: Secondary | ICD-10-CM | POA: Diagnosis not present

## 2021-05-19 DIAGNOSIS — R001 Bradycardia, unspecified: Secondary | ICD-10-CM | POA: Diagnosis not present

## 2021-05-19 DIAGNOSIS — I700 Atherosclerosis of aorta: Secondary | ICD-10-CM | POA: Diagnosis not present

## 2021-05-24 DIAGNOSIS — N390 Urinary tract infection, site not specified: Secondary | ICD-10-CM | POA: Diagnosis not present

## 2021-06-06 ENCOUNTER — Other Ambulatory Visit: Payer: Self-pay

## 2021-06-06 DIAGNOSIS — F32A Depression, unspecified: Secondary | ICD-10-CM

## 2021-06-06 MED ORDER — SERTRALINE HCL 100 MG PO TABS
100.0000 mg | ORAL_TABLET | Freq: Every day | ORAL | 3 refills | Status: DC
Start: 2021-06-06 — End: 2022-04-18

## 2021-06-09 DIAGNOSIS — R339 Retention of urine, unspecified: Secondary | ICD-10-CM | POA: Diagnosis not present

## 2021-06-16 DIAGNOSIS — N390 Urinary tract infection, site not specified: Secondary | ICD-10-CM | POA: Diagnosis not present

## 2021-06-30 ENCOUNTER — Emergency Department (HOSPITAL_BASED_OUTPATIENT_CLINIC_OR_DEPARTMENT_OTHER)
Admission: EM | Admit: 2021-06-30 | Discharge: 2021-06-30 | Disposition: A | Discharge status: Home / Self Care | Payer: Medicare Other | Attending: Emergency Medicine | Admitting: Emergency Medicine

## 2021-06-30 ENCOUNTER — Encounter (HOSPITAL_BASED_OUTPATIENT_CLINIC_OR_DEPARTMENT_OTHER): Payer: Self-pay | Admitting: Emergency Medicine

## 2021-06-30 ENCOUNTER — Other Ambulatory Visit: Payer: Self-pay

## 2021-06-30 ENCOUNTER — Emergency Department (HOSPITAL_BASED_OUTPATIENT_CLINIC_OR_DEPARTMENT_OTHER): Payer: Medicare Other

## 2021-06-30 DIAGNOSIS — R42 Dizziness and giddiness: Secondary | ICD-10-CM | POA: Insufficient documentation

## 2021-06-30 DIAGNOSIS — R404 Transient alteration of awareness: Secondary | ICD-10-CM | POA: Diagnosis not present

## 2021-06-30 DIAGNOSIS — S0990XA Unspecified injury of head, initial encounter: Secondary | ICD-10-CM | POA: Diagnosis not present

## 2021-06-30 DIAGNOSIS — I10 Essential (primary) hypertension: Secondary | ICD-10-CM | POA: Diagnosis not present

## 2021-06-30 DIAGNOSIS — Z7982 Long term (current) use of aspirin: Secondary | ICD-10-CM | POA: Insufficient documentation

## 2021-06-30 DIAGNOSIS — W19XXXA Unspecified fall, initial encounter: Secondary | ICD-10-CM | POA: Diagnosis not present

## 2021-06-30 DIAGNOSIS — Z743 Need for continuous supervision: Secondary | ICD-10-CM | POA: Diagnosis not present

## 2021-06-30 DIAGNOSIS — Z79899 Other long term (current) drug therapy: Secondary | ICD-10-CM | POA: Insufficient documentation

## 2021-06-30 LAB — CBC WITH DIFFERENTIAL/PLATELET
Abs Immature Granulocytes: 0.02 10*3/uL (ref 0.00–0.07)
Basophils Absolute: 0.1 10*3/uL (ref 0.0–0.1)
Basophils Relative: 1 %
Eosinophils Absolute: 0.2 10*3/uL (ref 0.0–0.5)
Eosinophils Relative: 2 %
HCT: 43.8 % (ref 39.0–52.0)
Hemoglobin: 14.4 g/dL (ref 13.0–17.0)
Immature Granulocytes: 0 %
Lymphocytes Relative: 24 %
Lymphs Abs: 2.6 10*3/uL (ref 0.7–4.0)
MCH: 30.7 pg (ref 26.0–34.0)
MCHC: 32.9 g/dL (ref 30.0–36.0)
MCV: 93.4 fL (ref 80.0–100.0)
Monocytes Absolute: 1.2 10*3/uL — ABNORMAL HIGH (ref 0.1–1.0)
Monocytes Relative: 11 %
Neutro Abs: 6.7 10*3/uL (ref 1.7–7.7)
Neutrophils Relative %: 62 %
Platelets: 258 10*3/uL (ref 150–400)
RBC: 4.69 MIL/uL (ref 4.22–5.81)
RDW: 13.4 % (ref 11.5–15.5)
WBC: 10.8 10*3/uL — ABNORMAL HIGH (ref 4.0–10.5)
nRBC: 0 % (ref 0.0–0.2)

## 2021-06-30 LAB — BASIC METABOLIC PANEL
Anion gap: 9 (ref 5–15)
BUN: 16 mg/dL (ref 8–23)
CO2: 24 mmol/L (ref 22–32)
Calcium: 9.2 mg/dL (ref 8.9–10.3)
Chloride: 103 mmol/L (ref 98–111)
Creatinine, Ser: 0.85 mg/dL (ref 0.61–1.24)
GFR, Estimated: >60 mL/min (ref >60)
Glucose, Bld: 100 mg/dL — ABNORMAL HIGH (ref 70–99)
Potassium: 3.7 mmol/L (ref 3.5–5.1)
Sodium: 136 mmol/L (ref 135–145)

## 2021-06-30 MED ORDER — ONDANSETRON 4 MG PO TBDP
4.0000 mg | ORAL_TABLET | Freq: Once | ORAL | Status: AC
  Administered 2021-06-30: 4 mg via ORAL
  Filled 2021-06-30: qty 1

## 2021-06-30 MED ORDER — ONDANSETRON HCL 4 MG PO TABS
4.0000 mg | ORAL_TABLET | Freq: Four times a day (QID) | ORAL | 0 refills | Status: DC
Start: 2021-06-30 — End: 2021-10-14

## 2021-06-30 MED ORDER — MECLIZINE HCL 25 MG PO TABS
25.0000 mg | ORAL_TABLET | Freq: Once | ORAL | Status: AC
  Administered 2021-06-30: 25 mg via ORAL
  Filled 2021-06-30: qty 1

## 2021-06-30 MED ORDER — MECLIZINE HCL 25 MG PO TABS: 25.0000 mg | ORAL_TABLET | Freq: Two times a day (BID) | ORAL | 0 refills | Status: DC | PRN

## 2021-06-30 NOTE — ED Notes (Signed)
Pt yelling at family. Family and pt did not want to wait for MRI. Family put pt in [facility] and left. This RN called MRI and they stated it would be 1.5 hours and pt is not willing to wait. ?

## 2021-06-30 NOTE — ED Notes (Signed)
Going POV to [facility]--Dr. Laverta Baltimore accepting  ?

## 2021-06-30 NOTE — ED Triage Notes (Signed)
Pt arrived via [facility] from home. Per EMS, pt reports that he fell 3 days ago down 2 steps and hit head when fall occurred. Denies LOC. No thinners. Pt c/o dizziness x2 days. No obvious neuro deficit per EMS. Pt caox4 and ambulatory.  ? ?EMS VS ?BP 122/60 ?HR 70 ?CBG 123 ?SpO2 97%  ?RR 16 ? ?

## 2021-06-30 NOTE — ED Notes (Signed)
Pt xferring via POV with SL intact (receiving charge nurse Gretta Cool, RN notified of SL in place at time of departure)   ?

## 2021-06-30 NOTE — ED Provider Notes (Signed)
?[facility] ?Provider Note ? ? ?CSN: [id number] ?Arrival date & time: 06/30/21  1805 ? ?  ? ?History ? ?Chief Complaint  ?Patient presents with  ? Dizziness  ?  Since fall  ? ? ?[facility]. is a 86 y.o. male. ? ?Patient with history of high cholesterol, hypertension who presents the ED with dizziness.  States that he has had dizziness since falling and hitting his head 3 days ago.  Symptoms are intermittent associated with nausea and imbalance.  Has poor vision in his left eye at baseline has dropfoot in his left leg that leads him susceptible to falls.  Since the fall he has been having a hard time with his gait and dizziness at times.  Denies any headaches.  Has had some nausea sometimes.  Denies any new weakness or numbness or speech changes.  Being still makes it better.  Movement makes it worse. ? ?The history is provided by the patient.  ? ?  ? ?Home Medications ?Prior to Admission medications   ?Medication Sig Start Date End Date Taking? Authorizing Provider  ?meclizine (ANTIVERT) 25 MG tablet Take 1 tablet (25 mg total) by mouth 2 (two) times daily as needed for up to 20 doses for dizziness. 06/30/21  Yes Yousef Huge, DO  ?ondansetron (ZOFRAN) 4 MG tablet Take 1 tablet (4 mg total) by mouth every 6 (six) hours. 06/30/21  Yes Lanna Labella, DO  ?acetaminophen (TYLENOL) 500 MG tablet Take 500-1,000 mg by mouth 2 (two) times daily.    [provider]  ?ALPRAZolam Duanne Moron) 0.5 MG tablet Take 0.5 mg by mouth daily as needed for anxiety. 01/19/20   [provider]  ?amLODipine (NORVASC) 10 MG tablet Take 10 mg by mouth daily.    [provider]  ?aspirin 81 MG chewable tablet Chew 1 tablet by mouth daily.    [provider]  ?bismuth subsalicylate (PEPTO BISMOL) 262 MG/15ML suspension Take 30 mLs by mouth every 6 (six) hours as needed for indigestion.     [provider]  ?busPIRone (BUSPAR) 7.5 MG tablet Take 7.5 mg by mouth 2 (two)  times daily. 10/08/20   [provider]  ?COMBIGAN 0.2-0.5 % ophthalmic solution Apply 1 drop to eye 2 (two) times daily. 10/08/20   [provider]  ?dicyclomine (BENTYL) 10 MG capsule Take 1 capsule (10 mg total) by mouth in the morning and at bedtime. 10/29/20   Irene Shipper, MD  ?docusate sodium (COLACE) 100 MG capsule Take 1 capsule (100 mg total) by mouth daily as needed for up to 30 doses. 03/05/20   Janith Lima, MD  ?latanoprost (XALATAN) 0.005 % ophthalmic solution Place 1 drop into both eyes at bedtime. 09/02/20   [provider]  ?levothyroxine (SYNTHROID) 75 MCG tablet Take 75 mcg by mouth daily before breakfast.    [provider]  ?losartan (COZAAR) 50 MG tablet Take 50 mg by mouth daily. 05/17/20   [provider]  ?neomycin-polymyxin b-dexamethasone (MAXITROL) 3.5-10000-0.1 SUSP Place 1 drop into both eyes daily as needed for allergies. 02/02/20   [provider]  ?pantoprazole (PROTONIX) 40 MG tablet Take 40 mg by mouth as needed. 05/19/20   [provider]  ?sertraline (ZOLOFT) 100 MG tablet Take 1 tablet (100 mg total) by mouth at bedtime. 06/06/21   Irene Shipper, MD  ?sildenafil (VIAGRA) 100 MG tablet Take 100 mg by mouth daily as needed for erectile dysfunction. pulmonary hypertension    [provider]  ?tamsulosin (FLOMAX) 0.4 MG CAPS capsule Take 0.4 mg by mouth daily. 10/13/15   [provider]  ?   ? ?Allergies    ?Patient has no known allergies.   ? ?Review of Systems   ?Review of Systems ? ?Physical Exam ?Updated Vital Signs ?BP (!) 154/82 (BP Location: Right Arm)   Pulse 66   Temp 98.8 ?F (37.1 ?C) (Oral)   Resp (!) 24   Ht '5\' 9"'$  (1.753 m)   Wt 79.4 kg   SpO2 97%   BMI 25.84 kg/m?  ?Physical Exam ?Vitals and nursing note reviewed.  ?Constitutional:   ?   General: He is not in acute distress. ?   Appearance: He is well-developed. He is not ill-appearing.  ?HENT:  ?   Head: Normocephalic and atraumatic.  ?    Nose: Nose normal.  ?   Mouth/Throat:  ?   Mouth: Mucous membranes are moist.  ?Eyes:  ?   Extraocular Movements: Extraocular movements intact.  ?   Conjunctiva/sclera: Conjunctivae normal.  ?   Pupils: Pupils are equal, round, and reactive to light.  ?Cardiovascular:  ?   Rate and Rhythm: Normal rate and regular rhythm.  ?   Heart sounds: No murmur heard. ?Pulmonary:  ?   Effort: Pulmonary effort is normal. No respiratory distress.  ?   Breath sounds: Normal breath sounds.  ?Abdominal:  ?   Palpations: Abdomen is soft.  ?   Tenderness: There is no abdominal tenderness.  ?Musculoskeletal:     ?   General: No swelling or tenderness.  ?   Cervical back: Normal range of motion and neck supple.  ?Skin: ?   General: Skin is warm and dry.  ?   Capillary Refill: Capillary refill takes less than 2 seconds.  ?Neurological:  ?   General: No focal deficit present.  ?   Mental Status: He is alert.  ?   Comments: Poor vision in his left eye baseline, no visual field deficit in the right eye, 5+ out of 5 strength except for foot drop in the left ankle, normal sensation, normal speech, normal heel-to-shin, normal finger-to-nose finger  ?Psychiatric:     ?   Mood and Affect: Mood normal.  ? ? ?ED Results / Procedures / Treatments   ?Labs ?(all labs ordered are listed, but only abnormal results are displayed) ?Labs Reviewed  ?CBC WITH DIFFERENTIAL/PLATELET - Abnormal; Notable for the following components:  ?    Result Value  ? WBC 10.8 (*)   ? Monocytes Absolute 1.2 (*)   ? All other components within normal limits  ?BASIC METABOLIC PANEL  ? ? ?EKG ?EKG Interpretation ? ?Date/Time:  Thursday Jun 30 2021 18:38:47 EDT ?Ventricular Rate:  75 ?PR Interval:  290 ?QRS Duration: 90 ?QT Interval:  409 ?QTC Calculation: 457 ?R Axis:   -36 ?Text Interpretation: Sinus rhythm Prolonged PR interval Left axis deviation Confirmed by Ronnald Nian, Laiklynn Raczynski ([id number]) on 06/30/2021 7:01:43 PM ? ?Radiology ?CT Head Wo Contrast ? ?Result Date: 06/30/2021 ?CLINICAL  DATA:  Severe fall, head trauma 3 days ago EXAM: CT HEAD WITHOUT CONTRAST TECHNIQUE: Contiguous axial images were obtained from the base of the skull through the vertex without intravenous contrast. RADIATION DOSE REDUCTION: This exam was performed according to the departmental dose-optimization program which includes automated exposure control, adjustment of the mA and/or kV according to patient size and/or use of iterative reconstruction technique. COMPARISON:  None Available. FINDINGS: Brain: Mild generalized atrophy. Negative for acute  infarct, hemorrhage, mass Vascular: Negative for hyperdense vessel Skull: Negative for skull fracture Sinuses/Orbits: Paranasal sinuses clear. Bilateral cataract extraction Other: None IMPRESSION: Generalized atrophy.  No acute abnormality. Electronically Signed   By: Franchot Gallo M.D.   On: 06/30/2021 18:59   ? ?Procedures ?Procedures  ? ? ?Medications Ordered in ED ?Medications  ?meclizine (ANTIVERT) tablet 25 mg (25 mg Oral Given 06/30/21 1905)  ?ondansetron (ZOFRAN-ODT) disintegrating tablet 4 mg (4 mg Oral Given 06/30/21 1904)  ? ? ?ED Course/ Medical Decision Making/ A&P ?  ?                        ?Medical Decision Making ?Amount and/or Complexity of Data Reviewed ?Labs: ordered. ?Radiology: ordered. ? ?Risk ?Prescription drug management. ? ? ?[name] is here with dizziness.  Normal vitals.  No fever.  EKG per my review and interpretation shows sinus rhythm.  No ischemic changes.  Patient been having dizziness and gait problems since mechanical fall 3 days ago.  He did hit his head.  Did not lose consciousness.  He is not on blood thinners.  Neurologically appears to be at his baseline.  He has foot drop on the left ankle, decreased vision in the left eye at baseline but otherwise neuro exam appears to be normal.  He is having a lot of symptoms when he is walking and changing directions.  Differential is likely concussion/vertigo.  Seems less likely to be stroke  given history and physical. ? ?CT scan performed shows no head injury or head bleed.  CBC shows no significant anemia.  BMP shows no significant AKI or electrolyte abnormality.  Patient was given meclizine a

## 2021-07-04 ENCOUNTER — Other Ambulatory Visit: Payer: Self-pay | Admitting: Internal Medicine

## 2021-07-04 DIAGNOSIS — R42 Dizziness and giddiness: Secondary | ICD-10-CM

## 2021-07-04 DIAGNOSIS — N390 Urinary tract infection, site not specified: Secondary | ICD-10-CM | POA: Diagnosis not present

## 2021-07-05 ENCOUNTER — Other Ambulatory Visit (HOSPITAL_COMMUNITY): Payer: Self-pay | Admitting: Internal Medicine

## 2021-07-05 ENCOUNTER — Other Ambulatory Visit: Payer: Self-pay | Admitting: Internal Medicine

## 2021-07-05 DIAGNOSIS — R42 Dizziness and giddiness: Secondary | ICD-10-CM

## 2021-07-06 ENCOUNTER — Encounter (HOSPITAL_COMMUNITY): Payer: Self-pay

## 2021-07-06 ENCOUNTER — Ambulatory Visit (HOSPITAL_COMMUNITY): Payer: Medicare Other

## 2021-07-06 DIAGNOSIS — R2681 Unsteadiness on feet: Secondary | ICD-10-CM | POA: Diagnosis not present

## 2021-07-06 DIAGNOSIS — R339 Retention of urine, unspecified: Secondary | ICD-10-CM | POA: Diagnosis not present

## 2021-07-06 DIAGNOSIS — H8110 Benign paroxysmal vertigo, unspecified ear: Secondary | ICD-10-CM | POA: Diagnosis not present

## 2021-07-06 DIAGNOSIS — M21372 Foot drop, left foot: Secondary | ICD-10-CM | POA: Diagnosis not present

## 2021-07-06 DIAGNOSIS — R5383 Other fatigue: Secondary | ICD-10-CM | POA: Diagnosis not present

## 2021-07-13 DIAGNOSIS — N3020 Other chronic cystitis without hematuria: Secondary | ICD-10-CM | POA: Diagnosis not present

## 2021-07-15 DIAGNOSIS — N3000 Acute cystitis without hematuria: Secondary | ICD-10-CM | POA: Diagnosis not present

## 2021-07-19 DIAGNOSIS — R339 Retention of urine, unspecified: Secondary | ICD-10-CM | POA: Diagnosis not present

## 2021-07-20 DIAGNOSIS — I10 Essential (primary) hypertension: Secondary | ICD-10-CM | POA: Diagnosis not present

## 2021-07-20 DIAGNOSIS — N390 Urinary tract infection, site not specified: Secondary | ICD-10-CM | POA: Diagnosis not present

## 2021-08-02 DIAGNOSIS — R2681 Unsteadiness on feet: Secondary | ICD-10-CM | POA: Diagnosis not present

## 2021-08-02 DIAGNOSIS — M21372 Foot drop, left foot: Secondary | ICD-10-CM | POA: Diagnosis not present

## 2021-08-02 DIAGNOSIS — H8110 Benign paroxysmal vertigo, unspecified ear: Secondary | ICD-10-CM | POA: Diagnosis not present

## 2021-08-03 DIAGNOSIS — N390 Urinary tract infection, site not specified: Secondary | ICD-10-CM | POA: Diagnosis not present

## 2021-08-12 DIAGNOSIS — N390 Urinary tract infection, site not specified: Secondary | ICD-10-CM | POA: Diagnosis not present

## 2021-08-15 DIAGNOSIS — R3914 Feeling of incomplete bladder emptying: Secondary | ICD-10-CM | POA: Diagnosis not present

## 2021-08-19 DIAGNOSIS — R4781 Slurred speech: Secondary | ICD-10-CM | POA: Diagnosis not present

## 2021-08-19 DIAGNOSIS — N390 Urinary tract infection, site not specified: Secondary | ICD-10-CM | POA: Diagnosis not present

## 2021-08-19 DIAGNOSIS — R339 Retention of urine, unspecified: Secondary | ICD-10-CM | POA: Diagnosis not present

## 2021-08-22 ENCOUNTER — Ambulatory Visit (HOSPITAL_COMMUNITY)
Admission: RE | Admit: 2021-08-22 | Discharge: 2021-08-22 | Disposition: A | Discharge status: Home / Self Care | Payer: Medicare Other | Source: Ambulatory Visit | Attending: Internal Medicine | Admitting: Internal Medicine

## 2021-08-22 DIAGNOSIS — R42 Dizziness and giddiness: Secondary | ICD-10-CM | POA: Diagnosis not present

## 2021-08-22 MED ORDER — GADOBUTROL 1 MMOL/ML IV SOLN
8.0000 mL | Freq: Once | INTRAVENOUS | Status: AC | PRN
  Administered 2021-08-22: 8 mL via INTRAVENOUS

## 2021-08-26 DIAGNOSIS — N390 Urinary tract infection, site not specified: Secondary | ICD-10-CM | POA: Diagnosis not present

## 2021-08-29 DIAGNOSIS — M25561 Pain in right knee: Secondary | ICD-10-CM | POA: Diagnosis not present

## 2021-08-29 DIAGNOSIS — M25562 Pain in left knee: Secondary | ICD-10-CM | POA: Diagnosis not present

## 2021-09-14 DIAGNOSIS — E039 Hypothyroidism, unspecified: Secondary | ICD-10-CM | POA: Diagnosis not present

## 2021-09-14 DIAGNOSIS — Z85828 Personal history of other malignant neoplasm of skin: Secondary | ICD-10-CM | POA: Diagnosis not present

## 2021-09-14 DIAGNOSIS — D692 Other nonthrombocytopenic purpura: Secondary | ICD-10-CM | POA: Diagnosis not present

## 2021-09-14 DIAGNOSIS — C44319 Basal cell carcinoma of skin of other parts of face: Secondary | ICD-10-CM | POA: Diagnosis not present

## 2021-09-14 DIAGNOSIS — L812 Freckles: Secondary | ICD-10-CM | POA: Diagnosis not present

## 2021-09-14 DIAGNOSIS — C44619 Basal cell carcinoma of skin of left upper limb, including shoulder: Secondary | ICD-10-CM | POA: Diagnosis not present

## 2021-09-14 DIAGNOSIS — I10 Essential (primary) hypertension: Secondary | ICD-10-CM | POA: Diagnosis not present

## 2021-09-14 DIAGNOSIS — I700 Atherosclerosis of aorta: Secondary | ICD-10-CM | POA: Diagnosis not present

## 2021-09-14 DIAGNOSIS — R339 Retention of urine, unspecified: Secondary | ICD-10-CM | POA: Diagnosis not present

## 2021-09-14 DIAGNOSIS — D485 Neoplasm of uncertain behavior of skin: Secondary | ICD-10-CM | POA: Diagnosis not present

## 2021-09-14 DIAGNOSIS — M21372 Foot drop, left foot: Secondary | ICD-10-CM | POA: Diagnosis not present

## 2021-09-14 DIAGNOSIS — L570 Actinic keratosis: Secondary | ICD-10-CM | POA: Diagnosis not present

## 2021-09-14 DIAGNOSIS — H8110 Benign paroxysmal vertigo, unspecified ear: Secondary | ICD-10-CM | POA: Diagnosis not present

## 2021-09-14 DIAGNOSIS — M7918 Myalgia, other site: Secondary | ICD-10-CM | POA: Diagnosis not present

## 2021-09-14 DIAGNOSIS — N390 Urinary tract infection, site not specified: Secondary | ICD-10-CM | POA: Diagnosis not present

## 2021-09-14 DIAGNOSIS — R4781 Slurred speech: Secondary | ICD-10-CM | POA: Diagnosis not present

## 2021-09-14 DIAGNOSIS — L821 Other seborrheic keratosis: Secondary | ICD-10-CM | POA: Diagnosis not present

## 2021-09-22 DIAGNOSIS — M21372 Foot drop, left foot: Secondary | ICD-10-CM | POA: Diagnosis not present

## 2021-09-28 DIAGNOSIS — M21372 Foot drop, left foot: Secondary | ICD-10-CM | POA: Diagnosis not present

## 2021-09-28 DIAGNOSIS — E039 Hypothyroidism, unspecified: Secondary | ICD-10-CM | POA: Diagnosis not present

## 2021-09-28 DIAGNOSIS — I10 Essential (primary) hypertension: Secondary | ICD-10-CM | POA: Diagnosis not present

## 2021-09-28 DIAGNOSIS — S060X9A Concussion with loss of consciousness of unspecified duration, initial encounter: Secondary | ICD-10-CM | POA: Diagnosis not present

## 2021-09-28 DIAGNOSIS — R5383 Other fatigue: Secondary | ICD-10-CM | POA: Diagnosis not present

## 2021-09-28 DIAGNOSIS — H401132 Primary open-angle glaucoma, bilateral, moderate stage: Secondary | ICD-10-CM | POA: Diagnosis not present

## 2021-09-28 DIAGNOSIS — R001 Bradycardia, unspecified: Secondary | ICD-10-CM | POA: Diagnosis not present

## 2021-09-28 DIAGNOSIS — H8110 Benign paroxysmal vertigo, unspecified ear: Secondary | ICD-10-CM | POA: Diagnosis not present

## 2021-09-28 DIAGNOSIS — R339 Retention of urine, unspecified: Secondary | ICD-10-CM | POA: Diagnosis not present

## 2021-10-04 DIAGNOSIS — H04123 Dry eye syndrome of bilateral lacrimal glands: Secondary | ICD-10-CM | POA: Diagnosis not present

## 2021-10-04 DIAGNOSIS — H0100A Unspecified blepharitis right eye, upper and lower eyelids: Secondary | ICD-10-CM | POA: Diagnosis not present

## 2021-10-04 DIAGNOSIS — H0100B Unspecified blepharitis left eye, upper and lower eyelids: Secondary | ICD-10-CM | POA: Diagnosis not present

## 2021-10-10 DIAGNOSIS — N390 Urinary tract infection, site not specified: Secondary | ICD-10-CM | POA: Diagnosis not present

## 2021-10-11 DIAGNOSIS — R5383 Other fatigue: Secondary | ICD-10-CM | POA: Diagnosis not present

## 2021-10-12 DIAGNOSIS — R5382 Chronic fatigue, unspecified: Secondary | ICD-10-CM | POA: Diagnosis not present

## 2021-10-12 DIAGNOSIS — R339 Retention of urine, unspecified: Secondary | ICD-10-CM | POA: Diagnosis not present

## 2021-10-14 ENCOUNTER — Encounter: Payer: Self-pay | Admitting: Cardiology

## 2021-10-14 ENCOUNTER — Ambulatory Visit: Payer: Medicare Other | Admitting: Cardiology

## 2021-10-14 ENCOUNTER — Ambulatory Visit (INDEPENDENT_AMBULATORY_CARE_PROVIDER_SITE_OTHER): Payer: Medicare Other

## 2021-10-14 VITALS — BP 114/62 | HR 67 | Ht 69.0 in | Wt 179.4 lb

## 2021-10-14 DIAGNOSIS — I491 Atrial premature depolarization: Secondary | ICD-10-CM | POA: Diagnosis not present

## 2021-10-14 DIAGNOSIS — R001 Bradycardia, unspecified: Secondary | ICD-10-CM | POA: Diagnosis not present

## 2021-10-14 MED ORDER — AMLODIPINE BESYLATE 5 MG PO TABS: 5.0000 mg | ORAL_TABLET | Freq: Every day | ORAL | 3 refills | Status: AC

## 2021-10-14 NOTE — Patient Instructions (Signed)
Medication Instructions:   -Decrease amlodipine (norvasc) to '5mg'$  once daily.  *If you need a refill on your cardiac medications before your next appointment, please call your pharmacy*    Testing/Procedures:  [place] Monitor Instructions  Your physician has requested you wear a ZIO patch monitor for 3 days.  This is a single patch monitor. [employer] supplies one patch monitor per enrollment. Additional stickers are not available. Please do not apply patch if you will be having a Nuclear Stress Test,  Echocardiogram, Cardiac CT, MRI, or Chest Xray during the period you would be wearing the  monitor. The patch cannot be worn during these tests. You cannot remove and re-apply the  ZIO XT patch monitor.  Your ZIO patch monitor will be mailed 3 day USPS to your address on file. It may take 3-5 days  to receive your monitor after you have been enrolled.  Once you have received your monitor, please review the enclosed instructions. Your monitor  has already been registered assigning a specific monitor serial # to you.  Billing and Patient Assistance Program Information  We have supplied [employer] with any of your insurance information on file for billing purposes. [employer] offers a sliding scale Patient Assistance Program for patients that do not have  insurance, or whose insurance does not completely cover the cost of the ZIO monitor.  You must apply for the Patient Assistance Program to qualify for this discounted rate.  To apply, please call [employer] at [phone], select option 4, select option 2, ask to apply for  Patient Assistance Program. Theodore Demark will ask your household income, and how many people  are in your household. They will quote your out-of-pocket cost based on that information.  [employer] will also be able to set up a 70-month interest-free payment plan if needed.  Applying the monitor  Shave hair from upper left chest.  Hold abrader disc by orange tab. Rub abrader  in 40 strokes over the upper left chest as  indicated in your monitor instructions.  Clean area with 4 enclosed alcohol pads. Let dry.  Apply patch as indicated in monitor instructions. Patch will be placed under collarbone on left  side of chest with arrow pointing upward.  Rub patch adhesive wings for 2 minutes. Remove white label marked "1". Remove the white  label marked "2". Rub patch adhesive wings for 2 additional minutes.  While looking in a mirror, press and release button in center of patch. A small green light will  flash 3-4 times. This will be your only indicator that the monitor has been turned on.  Do not shower for the first 24 hours. You may shower after the first 24 hours.  Press the button if you feel a symptom. You will hear a small click. Record Date, Time and  Symptom in the Patient Logbook.  When you are ready to remove the patch, follow instructions on the last 2 pages of Patient  Logbook. Stick patch monitor onto the last page of Patient Logbook.  Place Patient Logbook in the blue and white box. Use locking tab on box and tape box closed  securely. The blue and white box has prepaid postage on it. Please place it in the mailbox as  soon as possible. Your physician should have your test results approximately 7 days after the  monitor has been mailed back to [facility]  Call [facility] [phone] you have questions regarding  your ZIO XT patch monitor. Call  them immediately if you see an orange light blinking on your  monitor.  If your monitor falls off in less than 4 days, contact our Monitor department at [phone].  If your monitor becomes loose or falls off after 4 days call [employer] at [phone] for  suggestions on securing your monitor    Follow-Up: At [facility], you and your health needs are our priority.  As part of our continuing mission to provide you with exceptional heart care, we have created designated  Provider Care Teams.  These Care Teams include your primary Cardiologist (physician) and Advanced Practice Providers (APPs -  Physician Assistants and Nurse Practitioners) who all work together to provide you with the care you need, when you need it.  We recommend signing up for the patient portal called "MyChart".  Sign up information is provided on this After Visit Summary.  MyChart is used to connect with patients for Virtual Visits (Telemedicine).  Patients are able to view lab/test results, encounter notes, upcoming appointments, etc.  Non-urgent messages can be sent to your provider as well.   To learn more about what you can do with MyChart, go to [email or web address].    Your next appointment:   3 month(s)  The format for your next appointment:   In Person  Provider:   Fabian Sharp, PA-C, Sande Rives, PA-C, Caron Presume, PA-C, Jory Sims, DNP, ANP, Almyra Deforest, PA-C, or Diona Browner, NP

## 2021-10-14 NOTE — Progress Notes (Signed)
Cardiology Office Note   Date:  10/14/2021   ID:  Marvin Dickenson., DOB 1935-10-03, MRN [id number]  PCP:  Sueanne Margarita, DO  Cardiologist:   Minus Breeding, MD   Chief Complaint  Patient presents with   Fatigue      History of Present Illness: Marvin Jones. is a 86 y.o. male who presents for evaluation of fatigue he was added to my schedule today.    I last saw him in April 2022.  He had some shortness of breath but this was not different than when he had had a negative treadmill test some years before that.  No further testing was suggested.  He was managing his blood pressure.  He presents now with complaints of predominantly fatigue.  He was actually apparently in his primary care office recently and looks like he was noted to have premature atrial contractions.  He has been significantly fatigued.  He said this has been going on for about a year and a half.  He wakes up and just feels achy.  He has taken Tylenol and drink some coffee.  He is not having any frank syncope.  He is not having localized pain.  He does have bilateral knee pain and walks with a cane but this has been chronic.  He is actually able to do some light weight training.  He swims 10-15 laps most days.  He does not have any new shortness of breath with this.  He has not had any PND or orthopnea.  Does not feel palpitations.  He is in atrial bigeminy in the office today.     Past Medical History:  Diagnosis Date   Anxiety and depression    h/o   Arthritis    Barrett's esophagus    Diaphragmatic hernia without mention of obstruction or gangrene    Diverticulosis of colon (without mention of hemorrhage)    Esophageal reflux    HTN (hypertension)    Hyperlipidemia    Hypertrophy of prostate with urinary obstruction and other lower urinary tract symptoms (LUTS)    Irritable bowel syndrome    Personal history of colonic polyps    Skin cancer    sees Dr Ronnald Ramp    Stricture and stenosis of esophagus      Past Surgical History:  Procedure Laterality Date   CATARACT EXTRACTION, BILATERAL     COLONOSCOPY     TONSILLECTOMY     TRANSURETHRAL RESECTION OF PROSTATE N/A 03/05/2020   Procedure: TRANSURETHRAL RESECTION OF THE PROSTATE (TURP), BIPOLAR;  Surgeon: Janith Lima, MD;  Location: [facility];  Service: Urology;  Laterality: N/A;   UPPER GASTROINTESTINAL ENDOSCOPY       Current Outpatient Medications  Medication Sig Dispense Refill   acetaminophen (TYLENOL) 500 MG tablet Take 500-1,000 mg by mouth 2 (two) times daily.     ALPRAZolam (XANAX) 0.5 MG tablet Take 0.5 mg by mouth daily as needed for anxiety.     aspirin 81 MG chewable tablet Chew 1 tablet by mouth daily.     bismuth subsalicylate (PEPTO BISMOL) 262 MG/15ML suspension Take 30 mLs by mouth every 6 (six) hours as needed for indigestion.      busPIRone (BUSPAR) 7.5 MG tablet Take 7.5 mg by mouth 2 (two) times daily.     COMBIGAN 0.2-0.5 % ophthalmic solution Apply 1 drop to eye 2 (two) times daily.     dicyclomine (BENTYL) 10 MG capsule Take 1 capsule (10 mg total) by  mouth in the morning and at bedtime. 180 capsule 0   docusate sodium (COLACE) 100 MG capsule Take 1 capsule (100 mg total) by mouth daily as needed for up to 30 doses. 30 capsule 0   latanoprost (XALATAN) 0.005 % ophthalmic solution Place 1 drop into both eyes at bedtime.     levothyroxine (SYNTHROID) 75 MCG tablet Take 75 mcg by mouth daily before breakfast.     losartan (COZAAR) 50 MG tablet Take 50 mg by mouth daily.     pantoprazole (PROTONIX) 40 MG tablet Take 40 mg by mouth as needed.     sertraline (ZOLOFT) 100 MG tablet Take 1 tablet (100 mg total) by mouth at bedtime. 90 tablet 3   sildenafil (VIAGRA) 100 MG tablet Take 100 mg by mouth daily as needed for erectile dysfunction. pulmonary hypertension     tamsulosin (FLOMAX) 0.4 MG CAPS capsule Take 0.4 mg by mouth daily.     amLODipine (NORVASC) 5 MG tablet Take 1 tablet (5 mg total) by mouth daily. 90  tablet 3   Current Facility-Administered Medications  Medication Dose Route Frequency Provider Last Rate Last Admin   hyoscyamine (LEVBID) 0.375 MG 12 hr tablet 0.375 mg  0.375 mg Oral Q12H Cirigliano, Vito V, DO        Allergies:   Patient has no known allergies.    ROS:  Please see the history of present illness.   Otherwise, review of systems are positive for none.   All other systems are reviewed and negative.    PHYSICAL EXAM: VS:  BP 114/62   Pulse 67   Ht '5\' 9"'$  (1.753 m)   Wt 179 lb 6.4 oz (81.4 kg)   SpO2 96%   BMI 26.49 kg/m  , BMI Body mass index is 26.49 kg/m. GENERAL:  Well appearing NECK:  No jugular venous distention, waveform within normal limits, carotid upstroke brisk and symmetric, no bruits, no thyromegaly LUNGS:  Clear to auscultation bilaterally CHEST:  Unremarkable HEART:  PMI not displaced or sustained,S1 and S2 within normal limits, no S3, no S4, no clicks, no rubs, no murmurs ABD:  Flat, positive bowel sounds normal in frequency in pitch, no bruits, no rebound, no guarding, no midline pulsatile mass, no hepatomegaly, no splenomegaly EXT:  2 plus pulses throughout, no edema, no cyanosis no clubbing   EKG:  EKG is  ordered today. The ekg ordered today demonstrates sinus rhythm, rate 67, left axis deviation, premature atrial contractions in a bigeminal pattern   Recent Labs: 06/30/2021: BUN 16; Creatinine, Ser 0.85; Hemoglobin 14.4; Platelets 258; Potassium 3.7; Sodium 136    Lipid Panel    Component Value Date/Time   CHOL 196 03/09/2014 0957   TRIG 160.0 (H) 03/09/2014 0957   HDL 45.00 03/09/2014 0957   CHOLHDL 4 03/09/2014 0957   VLDL 32.0 03/09/2014 0957   LDLCALC 119 (H) 03/09/2014 0957   LDLDIRECT 128.5 01/30/2011 1110      Wt Readings from Last 3 Encounters:  10/14/21 179 lb 6.4 oz (81.4 kg)  06/30/21 175 lb (79.4 kg)  01/03/21 172 lb 9.6 oz (78.3 kg)      Other studies Reviewed: Additional studies/ records that were reviewed  today include: None. Review of the above records demonstrates:  Please see elsewhere in the note.  See elsewhere   ASSESSMENT AND PLAN:  FATIGUE: He does have a blood pressure that runs on the lower side although his cuff at home suggest might be higher.  The readings I  see are in the 1 teens systolic.  Given his age and his fatigue I think it would be prudent to just try reducing of the amlodipine to 5 mg to see if he feels better.  While I do not think this is related to any arrhythmia I will apply a 3-day monitor given the atrial bigeminy.  Of note he was not anemic in May.  In March he had a normal thyroid  HTN: This will be managed as above.    BRUIT:  He had no carotid stenosis.   Current medicines are reviewed at length with the patient today.  The patient does not have concerns regarding medicines.  The following changes have been made:  no change  Labs/ tests ordered today include:  Orders Placed This Encounter  Zio  Disposition:   FU with APP in 3 months. Ronnell Guadalajara, MD  10/14/2021 4:38 PM    [place]

## 2021-10-14 NOTE — Progress Notes (Unsigned)
Enrolled for [employer] to mail a ZIO XT long term holter monitor to the patients address on file.  

## 2021-10-18 DIAGNOSIS — R001 Bradycardia, unspecified: Secondary | ICD-10-CM | POA: Diagnosis not present

## 2021-10-18 DIAGNOSIS — I491 Atrial premature depolarization: Secondary | ICD-10-CM | POA: Diagnosis not present

## 2021-10-25 DIAGNOSIS — N390 Urinary tract infection, site not specified: Secondary | ICD-10-CM | POA: Diagnosis not present

## 2021-10-28 DIAGNOSIS — I491 Atrial premature depolarization: Secondary | ICD-10-CM | POA: Diagnosis not present

## 2021-10-28 DIAGNOSIS — R001 Bradycardia, unspecified: Secondary | ICD-10-CM | POA: Diagnosis not present

## 2021-10-31 DIAGNOSIS — R748 Abnormal levels of other serum enzymes: Secondary | ICD-10-CM | POA: Diagnosis not present

## 2021-11-07 ENCOUNTER — Telehealth: Payer: Self-pay | Admitting: Internal Medicine

## 2021-11-07 NOTE — Telephone Encounter (Signed)
Pt called stating that he would like to establish care with Dr. Charlett Blake. Pt states that his son and sister, Sade Mehlhoff III and Wilver Tignor, see Dr. Charlett Blake and have really good things to say about her. Pt was advised that Dr. Charlett Blake was not taking on new patients and a message would have to be sent back for approval. Pt acknowledged understanding.  Is this pt approved for NP care establishment?

## 2021-11-10 NOTE — Telephone Encounter (Signed)
Pt called and advised of decision. Pt did not want to pursue NP establishment at this time.

## 2021-11-14 DIAGNOSIS — R339 Retention of urine, unspecified: Secondary | ICD-10-CM | POA: Diagnosis not present

## 2021-11-15 DIAGNOSIS — D3502 Benign neoplasm of left adrenal gland: Secondary | ICD-10-CM | POA: Diagnosis not present

## 2021-11-15 DIAGNOSIS — R2681 Unsteadiness on feet: Secondary | ICD-10-CM | POA: Diagnosis not present

## 2021-11-15 DIAGNOSIS — E039 Hypothyroidism, unspecified: Secondary | ICD-10-CM | POA: Diagnosis not present

## 2021-11-15 DIAGNOSIS — M791 Myalgia, unspecified site: Secondary | ICD-10-CM | POA: Diagnosis not present

## 2021-11-15 DIAGNOSIS — R5383 Other fatigue: Secondary | ICD-10-CM | POA: Diagnosis not present

## 2021-11-17 ENCOUNTER — Encounter: Payer: Self-pay | Admitting: *Deleted

## 2021-11-21 DIAGNOSIS — I10 Essential (primary) hypertension: Secondary | ICD-10-CM | POA: Diagnosis not present

## 2021-11-21 DIAGNOSIS — R7989 Other specified abnormal findings of blood chemistry: Secondary | ICD-10-CM | POA: Diagnosis not present

## 2021-11-25 DIAGNOSIS — Z1389 Encounter for screening for other disorder: Secondary | ICD-10-CM | POA: Diagnosis not present

## 2021-11-25 DIAGNOSIS — R5383 Other fatigue: Secondary | ICD-10-CM | POA: Diagnosis not present

## 2021-11-25 DIAGNOSIS — M7918 Myalgia, other site: Secondary | ICD-10-CM | POA: Diagnosis not present

## 2021-11-25 DIAGNOSIS — Z13828 Encounter for screening for other musculoskeletal disorder: Secondary | ICD-10-CM | POA: Diagnosis not present

## 2021-11-25 DIAGNOSIS — R001 Bradycardia, unspecified: Secondary | ICD-10-CM | POA: Diagnosis not present

## 2021-11-25 DIAGNOSIS — Z0000 Encounter for general adult medical examination without abnormal findings: Secondary | ICD-10-CM | POA: Diagnosis not present

## 2021-11-25 DIAGNOSIS — R339 Retention of urine, unspecified: Secondary | ICD-10-CM | POA: Diagnosis not present

## 2021-11-25 DIAGNOSIS — E039 Hypothyroidism, unspecified: Secondary | ICD-10-CM | POA: Diagnosis not present

## 2021-11-25 DIAGNOSIS — Z23 Encounter for immunization: Secondary | ICD-10-CM | POA: Diagnosis not present

## 2021-11-25 DIAGNOSIS — R2681 Unsteadiness on feet: Secondary | ICD-10-CM | POA: Diagnosis not present

## 2021-11-25 DIAGNOSIS — I10 Essential (primary) hypertension: Secondary | ICD-10-CM | POA: Diagnosis not present

## 2021-11-25 DIAGNOSIS — I700 Atherosclerosis of aorta: Secondary | ICD-10-CM | POA: Diagnosis not present

## 2021-12-01 DIAGNOSIS — M170 Bilateral primary osteoarthritis of knee: Secondary | ICD-10-CM | POA: Diagnosis not present

## 2021-12-05 DIAGNOSIS — I10 Essential (primary) hypertension: Secondary | ICD-10-CM | POA: Diagnosis not present

## 2021-12-05 DIAGNOSIS — N390 Urinary tract infection, site not specified: Secondary | ICD-10-CM | POA: Diagnosis not present

## 2021-12-14 DIAGNOSIS — R339 Retention of urine, unspecified: Secondary | ICD-10-CM | POA: Diagnosis not present

## 2021-12-21 DIAGNOSIS — N390 Urinary tract infection, site not specified: Secondary | ICD-10-CM | POA: Diagnosis not present

## 2021-12-30 NOTE — Progress Notes (Deleted)
Assessment/Plan:    1.  Tremor  -As per previous notes, it is really so mild that I would not want to add further medications.   -He tried low-dose primidone in the past and he did not think it was helpful.  -I have really never seen any significant degree of tremor and he has followed me for several years   2.  Probable PN  -Lab work-up for reversible causes was negative. 3.  Foot drop, L  -probable L5 radiculopathy.  Has been told by neurosx that surgery won't help  -wear AFO (has one at home) as I think that certainly contributes to the balance issue.  4.  Fatigue, chronic  -Primary care records reviewed.  They have worked the patient up extensively for this and felt that causes have been multifactorial, including deconditioning, grieving the loss of his wife, age, etc.  -Cardiology just saw him in regards to same and amlodipine dose was decreased.    Subjective:   Marvin Jones. was seen today in follow up for tremor.  My previous records were reviewed prior to todays visit.  Last visit, I did not see much tremor (which has been true since I have started seeing him).  Because of this, we have never recommended medication.  He has not called within the last year.  We did discuss neurocognitive testing last visit, but he wanted to think about it and said he would call us back and let us know.  He did not do that.  He has been following with urology because of recurrent UTIs.  He saw Dr. Percival Spanish for bradycardia and fatigue and it was recommended that he reduce the amlodipine.   Primidone, 50 mg at bedtime   ALLERGIES:  No Known Allergies  CURRENT MEDICATIONS:  Outpatient Encounter Medications as of 01/03/2022  Medication Sig   acetaminophen (TYLENOL) 500 MG tablet Take 500-1,000 mg by mouth 2 (two) times daily.   ALPRAZolam (XANAX) 0.5 MG tablet Take 0.5 mg by mouth daily as needed for anxiety.   amLODipine (NORVASC) 5 MG tablet Take 1 tablet (5 mg total) by mouth  daily.   aspirin 81 MG chewable tablet Chew 1 tablet by mouth daily.   bismuth subsalicylate (PEPTO BISMOL) 262 MG/15ML suspension Take 30 mLs by mouth every 6 (six) hours as needed for indigestion.    busPIRone (BUSPAR) 7.5 MG tablet Take 7.5 mg by mouth 2 (two) times daily.   COMBIGAN 0.2-0.5 % ophthalmic solution Apply 1 drop to eye 2 (two) times daily.   dicyclomine (BENTYL) 10 MG capsule Take 1 capsule (10 mg total) by mouth in the morning and at bedtime.   docusate sodium (COLACE) 100 MG capsule Take 1 capsule (100 mg total) by mouth daily as needed for up to 30 doses.   latanoprost (XALATAN) 0.005 % ophthalmic solution Place 1 drop into both eyes at bedtime.   levothyroxine (SYNTHROID) 75 MCG tablet Take 75 mcg by mouth daily before breakfast.   losartan (COZAAR) 50 MG tablet Take 50 mg by mouth daily.   pantoprazole (PROTONIX) 40 MG tablet Take 40 mg by mouth as needed.   sertraline (ZOLOFT) 100 MG tablet Take 1 tablet (100 mg total) by mouth at bedtime.   sildenafil (VIAGRA) 100 MG tablet Take 100 mg by mouth daily as needed for erectile dysfunction. pulmonary hypertension   tamsulosin (FLOMAX) 0.4 MG CAPS capsule Take 0.4 mg by mouth daily.   Facility-Administered Encounter Medications as of 01/03/2022  Medication  hyoscyamine (LEVBID) 0.375 MG 12 hr tablet 0.375 mg     Objective:    PHYSICAL EXAMINATION:    VITALS:   There were no vitals filed for this visit.    GEN:  The patient appears stated age and is in NAD. HEENT:  Normocephalic, atraumatic.  The mucous membranes are moist. The superficial temporal arteries are without ropiness or tenderness. CV:  RRR Lungs:  CTAB Neck/HEME:  There are no carotid bruits bilaterally.  Neurological examination:  Orientation: The patient is alert and oriented x3. Cranial nerves: There is good facial symmetry. The speech is fluent and clear. Soft palate rises symmetrically and there is no tongue deviation. Hearing is intact to  conversational tone. Sensation: Sensation is intact to light touch throughout.  He has decreased vibration distally. Deep tendon reflexes: 1/4 at the bilateral biceps, triceps, brachioradialis, patella Motor: Strength is at least antigravity x4.  Movement examination: Tone: There is normal tone in the UE/LE Abnormal movements: no rest tremor.  He has mild postural tremor.  No trouble pouring a water from one glass to another.  No trouble with archimedes spirals.   Coordination:  There is no decremation with RAM's Gait and Station: The patient has mild difficulty arising out of a deep-seated chair without the use of the hands. The patient is very wide based and he has foot drop on the L and marches on that side b/c of that. I have reviewed and interpreted the following labs independently   Chemistry      Component Value Date/Time   NA 136 06/30/2021 1927   K 3.7 06/30/2021 1927   CL 103 06/30/2021 1927   CO2 24 06/30/2021 1927   BUN 16 06/30/2021 1927   CREATININE 0.85 06/30/2021 1927   CREATININE 1.12 08/02/2011 1022      Component Value Date/Time   CALCIUM 9.2 06/30/2021 1927   ALKPHOS 56 01/12/2020 1326   AST 35 01/12/2020 1326   ALT 28 01/12/2020 1326   BILITOT 0.6 01/12/2020 1326      Lab Results  Component Value Date   WBC 10.8 (H) 06/30/2021   HGB 14.4 06/30/2021   HCT 43.8 06/30/2021   MCV 93.4 06/30/2021   PLT 258 06/30/2021   Lab Results  Component Value Date   TSH 1.790 01/12/2020     Chemistry      Component Value Date/Time   NA 136 06/30/2021 1927   K 3.7 06/30/2021 1927   CL 103 06/30/2021 1927   CO2 24 06/30/2021 1927   BUN 16 06/30/2021 1927   CREATININE 0.85 06/30/2021 1927   CREATININE 1.12 08/02/2011 1022      Component Value Date/Time   CALCIUM 9.2 06/30/2021 1927   ALKPHOS 56 01/12/2020 1326   AST 35 01/12/2020 1326   ALT 28 01/12/2020 1326   BILITOT 0.6 01/12/2020 1326     Lab Results  Component Value Date   [id number]  12/25/2014       Total time spent on today's visit was ***minutes, including both face-to-face time and nonface-to-face time.  Time included that spent on review of records (prior notes available to me/labs/imaging if pertinent), discussing treatment and goals, answering patient's questions and coordinating care.  Cc:  Sueanne Margarita, DO

## 2022-01-02 DIAGNOSIS — N390 Urinary tract infection, site not specified: Secondary | ICD-10-CM | POA: Diagnosis not present

## 2022-01-03 ENCOUNTER — Ambulatory Visit: Payer: Medicare Other | Admitting: Neurology

## 2022-01-03 ENCOUNTER — Encounter: Payer: Self-pay | Admitting: Neurology

## 2022-01-03 DIAGNOSIS — E039 Hypothyroidism, unspecified: Secondary | ICD-10-CM | POA: Diagnosis not present

## 2022-01-03 DIAGNOSIS — R5383 Other fatigue: Secondary | ICD-10-CM | POA: Diagnosis not present

## 2022-01-03 DIAGNOSIS — M7918 Myalgia, other site: Secondary | ICD-10-CM | POA: Diagnosis not present

## 2022-01-03 DIAGNOSIS — I10 Essential (primary) hypertension: Secondary | ICD-10-CM | POA: Diagnosis not present

## 2022-01-03 DIAGNOSIS — Z029 Encounter for administrative examinations, unspecified: Secondary | ICD-10-CM

## 2022-01-03 DIAGNOSIS — R339 Retention of urine, unspecified: Secondary | ICD-10-CM | POA: Diagnosis not present

## 2022-01-03 DIAGNOSIS — L989 Disorder of the skin and subcutaneous tissue, unspecified: Secondary | ICD-10-CM | POA: Diagnosis not present

## 2022-01-03 DIAGNOSIS — I700 Atherosclerosis of aorta: Secondary | ICD-10-CM | POA: Diagnosis not present

## 2022-01-16 DIAGNOSIS — R339 Retention of urine, unspecified: Secondary | ICD-10-CM | POA: Diagnosis not present

## 2022-01-17 ENCOUNTER — Ambulatory Visit: Payer: Medicare Other | Admitting: Cardiology

## 2022-01-17 ENCOUNTER — Ambulatory Visit: Payer: Medicare Other | Attending: Cardiology | Admitting: Nurse Practitioner

## 2022-01-17 NOTE — Progress Notes (Deleted)
Office Visit    Patient Name: Marvin Jones. Date of Encounter: 01/17/2022  Primary Care Provider:  Sueanne Margarita, DO Primary Cardiologist:  Minus Breeding, MD  Chief Complaint    86 year old male with a history of chronic fatigue, hypertension, hyperlipidemia, carotid bruit, IBS, Barrett's esophagus, anxiety, and depression who presents for follow-up related to hypertension and fatigue.  Past Medical History    Past Medical History:  Diagnosis Date   Anxiety and depression    h/o   Arthritis    Barrett's esophagus    Diaphragmatic hernia without mention of obstruction or gangrene    Diverticulosis of colon (without mention of hemorrhage)    Esophageal reflux    HTN (hypertension)    Hyperlipidemia    Hypertrophy of prostate with urinary obstruction and other lower urinary tract symptoms (LUTS)    Irritable bowel syndrome    Personal history of colonic polyps    Skin cancer    sees Dr Ronnald Ramp    Stricture and stenosis of esophagus    Past Surgical History:  Procedure Laterality Date   CATARACT EXTRACTION, BILATERAL     COLONOSCOPY     TONSILLECTOMY     TRANSURETHRAL RESECTION OF PROSTATE N/A 03/05/2020   Procedure: TRANSURETHRAL RESECTION OF THE PROSTATE (TURP), BIPOLAR;  Surgeon: Janith Lima, MD;  Location: [facility];  Service: Urology;  Laterality: N/A;   UPPER GASTROINTESTINAL ENDOSCOPY      Allergies  No Known Allergies  History of Present Illness    86 year old male with the above past medical history including chronic fatigue, hypertension, hyperlipidemia, carotid bruit, IBS, Barrett's esophagus, anxiety, and depression.  ETT in 2020 was negative for ischemia.  Carotid Dopplers in 06/2020 in the setting of carotid bruit showed no evidence of carotid artery stenosis.  Last in the office on 10/14/2021 and noted to significant fatigue. Outpatient cardiac monitor in 10/2021 showed predominantly normal sinus rhythm, frequent SVT, 1 episode of NSVT (4 beats), and  frequent PVCs (10% burden).  His amlodipine was decreased to 5 mg daily.  He presents today for follow-up.  Since his last visit  Chronic fatigue: Hypertension: Hyperlipidemia: Carotid bruit: Disposition:  Home Medications    Current Outpatient Medications  Medication Sig Dispense Refill   acetaminophen (TYLENOL) 500 MG tablet Take 500-1,000 mg by mouth 2 (two) times daily.     ALPRAZolam (XANAX) 0.5 MG tablet Take 0.5 mg by mouth daily as needed for anxiety.     amLODipine (NORVASC) 5 MG tablet Take 1 tablet (5 mg total) by mouth daily. 90 tablet 3   aspirin 81 MG chewable tablet Chew 1 tablet by mouth daily.     bismuth subsalicylate (PEPTO BISMOL) 262 MG/15ML suspension Take 30 mLs by mouth every 6 (six) hours as needed for indigestion.      busPIRone (BUSPAR) 7.5 MG tablet Take 7.5 mg by mouth 2 (two) times daily.     COMBIGAN 0.2-0.5 % ophthalmic solution Apply 1 drop to eye 2 (two) times daily.     dicyclomine (BENTYL) 10 MG capsule Take 1 capsule (10 mg total) by mouth in the morning and at bedtime. 180 capsule 0   docusate sodium (COLACE) 100 MG capsule Take 1 capsule (100 mg total) by mouth daily as needed for up to 30 doses. 30 capsule 0   latanoprost (XALATAN) 0.005 % ophthalmic solution Place 1 drop into both eyes at bedtime.     levothyroxine (SYNTHROID) 75 MCG tablet Take 75 mcg by mouth daily  before breakfast.     losartan (COZAAR) 50 MG tablet Take 50 mg by mouth daily.     pantoprazole (PROTONIX) 40 MG tablet Take 40 mg by mouth as needed.     sertraline (ZOLOFT) 100 MG tablet Take 1 tablet (100 mg total) by mouth at bedtime. 90 tablet 3   sildenafil (VIAGRA) 100 MG tablet Take 100 mg by mouth daily as needed for erectile dysfunction. pulmonary hypertension     tamsulosin (FLOMAX) 0.4 MG CAPS capsule Take 0.4 mg by mouth daily.     Current Facility-Administered Medications  Medication Dose Route Frequency Provider Last Rate Last Admin   hyoscyamine (LEVBID) 0.375 MG  12 hr tablet 0.375 mg  0.375 mg Oral Q12H Cirigliano, Vito V, DO         Review of Systems    ***.  All other systems reviewed and are otherwise negative except as noted above.    Physical Exam    VS:  There were no vitals taken for this visit. , BMI There is no height or weight on file to calculate BMI.     GEN: Well nourished, well developed, in no acute distress. HEENT: normal. Neck: Supple, no JVD, carotid bruits, or masses. Cardiac: RRR, no murmurs, rubs, or gallops. No clubbing, cyanosis, edema.  Radials/DP/PT 2+ and equal bilaterally.  Respiratory:  Respirations regular and unlabored, clear to auscultation bilaterally. GI: Soft, nontender, nondistended, BS + x 4. MS: no deformity or atrophy. Skin: warm and dry, no rash. Neuro:  Strength and sensation are intact. Psych: Normal affect.  Accessory Clinical Findings    ECG personally reviewed by me today - *** - no acute changes.   Lab Results  Component Value Date   WBC 10.8 (H) 06/30/2021   HGB 14.4 06/30/2021   HCT 43.8 06/30/2021   MCV 93.4 06/30/2021   PLT 258 06/30/2021   Lab Results  Component Value Date   CREATININE 0.85 06/30/2021   BUN 16 06/30/2021   NA 136 06/30/2021   K 3.7 06/30/2021   CL 103 06/30/2021   CO2 24 06/30/2021   Lab Results  Component Value Date   ALT 28 01/12/2020   AST 35 01/12/2020   ALKPHOS 56 01/12/2020   BILITOT 0.6 01/12/2020   Lab Results  Component Value Date   CHOL 196 03/09/2014   HDL 45.00 03/09/2014   LDLCALC 119 (H) 03/09/2014   LDLDIRECT 128.5 01/30/2011   TRIG 160.0 (H) 03/09/2014   CHOLHDL 4 03/09/2014    Lab Results  Component Value Date   HGBA1C 5.7 (H) 12/25/2014    Assessment & Plan    1.  ***  No BP recorded.  {Refresh Note OR Click here to enter BP  :1}***   Lenna Sciara, NP 01/17/2022, 6:50 AM

## 2022-01-18 ENCOUNTER — Emergency Department (HOSPITAL_COMMUNITY): Payer: Medicare Other

## 2022-01-18 ENCOUNTER — Emergency Department (HOSPITAL_COMMUNITY)
Admission: EM | Admit: 2022-01-18 | Discharge: 2022-01-18 | Disposition: A | Discharge status: Home / Self Care | Payer: Medicare Other | Attending: Emergency Medicine | Admitting: Emergency Medicine

## 2022-01-18 DIAGNOSIS — Z79899 Other long term (current) drug therapy: Secondary | ICD-10-CM | POA: Diagnosis not present

## 2022-01-18 DIAGNOSIS — Z7982 Long term (current) use of aspirin: Secondary | ICD-10-CM | POA: Diagnosis not present

## 2022-01-18 DIAGNOSIS — Z743 Need for continuous supervision: Secondary | ICD-10-CM | POA: Diagnosis not present

## 2022-01-18 DIAGNOSIS — I10 Essential (primary) hypertension: Secondary | ICD-10-CM | POA: Diagnosis not present

## 2022-01-18 DIAGNOSIS — R6889 Other general symptoms and signs: Secondary | ICD-10-CM | POA: Diagnosis not present

## 2022-01-18 DIAGNOSIS — U071 COVID-19: Secondary | ICD-10-CM | POA: Insufficient documentation

## 2022-01-18 DIAGNOSIS — R531 Weakness: Secondary | ICD-10-CM | POA: Diagnosis not present

## 2022-01-18 LAB — CBC
HCT: 46.5 % (ref 39.0–52.0)
Hemoglobin: 15.5 g/dL (ref 13.0–17.0)
MCH: 31.5 pg (ref 26.0–34.0)
MCHC: 33.3 g/dL (ref 30.0–36.0)
MCV: 94.5 fL (ref 80.0–100.0)
Platelets: 211 10*3/uL (ref 150–400)
RBC: 4.92 MIL/uL (ref 4.22–5.81)
RDW: 13.7 % (ref 11.5–15.5)
WBC: 12 10*3/uL — ABNORMAL HIGH (ref 4.0–10.5)
nRBC: 0 % (ref 0.0–0.2)

## 2022-01-18 LAB — URINALYSIS, ROUTINE W REFLEX MICROSCOPIC
Bilirubin Urine: NEGATIVE
Glucose, UA: NEGATIVE mg/dL
Ketones, ur: NEGATIVE mg/dL
Leukocytes,Ua: NEGATIVE
Nitrite: NEGATIVE
Protein, ur: >=300 mg/dL — AB
Specific Gravity, Urine: 1.013 (ref 1.005–1.030)
pH: 7 (ref 5.0–8.0)

## 2022-01-18 LAB — RESP PANEL BY RT-PCR (FLU A&B, COVID) ARPGX2
Influenza A by PCR: NEGATIVE
Influenza B by PCR: NEGATIVE
SARS Coronavirus 2 by RT PCR: POSITIVE — AB

## 2022-01-18 LAB — BASIC METABOLIC PANEL
Anion gap: 14 (ref 5–15)
BUN: 15 mg/dL (ref 8–23)
CO2: 22 mmol/L (ref 22–32)
Calcium: 9.2 mg/dL (ref 8.9–10.3)
Chloride: 99 mmol/L (ref 98–111)
Creatinine, Ser: 0.94 mg/dL (ref 0.61–1.24)
GFR, Estimated: >60 mL/min (ref >60)
Glucose, Bld: 114 mg/dL — ABNORMAL HIGH (ref 70–99)
Potassium: 4.1 mmol/L (ref 3.5–5.1)
Sodium: 135 mmol/L (ref 135–145)

## 2022-01-18 LAB — CBG MONITORING, ED: Glucose-Capillary: 118 mg/dL — ABNORMAL HIGH (ref 70–99)

## 2022-01-18 MED ORDER — SODIUM CHLORIDE 0.9 % IV BOLUS
1000.0000 mL | Freq: Once | INTRAVENOUS | Status: AC
  Administered 2022-01-18: 1000 mL via INTRAVENOUS

## 2022-01-18 MED ORDER — ACETAMINOPHEN 500 MG PO TABS
1000.0000 mg | ORAL_TABLET | Freq: Once | ORAL | Status: AC
  Administered 2022-01-18: 1000 mg via ORAL
  Filled 2022-01-18: qty 2

## 2022-01-18 MED ORDER — NIRMATRELVIR/RITONAVIR (PAXLOVID)TABLET: 3.0000 | ORAL_TABLET | Freq: Two times a day (BID) | ORAL | 0 refills | Status: AC

## 2022-01-18 MED ORDER — ACETAMINOPHEN 500 MG PO TABS: 500.0000 mg | ORAL_TABLET | Freq: Two times a day (BID) | ORAL | 0 refills | Status: AC

## 2022-01-18 NOTE — ED Notes (Signed)
Pt ambulated to the bathroom with the assistance of his personal cane. Ambulated without difficulty.  Pt states he is feeling much better.

## 2022-01-18 NOTE — Discharge Instructions (Addendum)
Recommendations for at home COVID-19 symptoms management:  Please continue isolation at home.  If have acute worsening of symptoms please go to ER/urgent care for further evaluation. Check pulse oximetry and if below 90-92% please go to ER. The following supplements MAY help:  Vitamin C 500mg twice a day and Quercetin 250-500 mg twice a day Vitamin D3 2000 - 4000 u/day B Complex vitamins Zinc 75-100 mg/day Melatonin 6-10 mg at night (the optimal dose is unknown) Aspirin 81mg/day (if no history of bleeding issues)  

## 2022-01-18 NOTE — ED Triage Notes (Signed)
Pt brought in by EMS [facility]) for complaints of weakness.  Pt coming from home, has been feeling bad for the past 2 days and woke up this morning with weakness and unable to get out of bed.  Pt states he has had cold symptoms the last 2 days with congestion and head fullness.  Denies cough or shortness of breath. Pt also reports he had chills last night but he did not check his temperature.

## 2022-01-18 NOTE — ED Provider Notes (Signed)
[facility] EMERGENCY DEPARTMENT Provider Note   CSN: [id number] Arrival date & time: 01/18/22  8366     History  Chief Complaint  Patient presents with  . Weakness    Marvin Jones. is a 86 y.o. male.  The history is provided by the patient and medical records. No language interpreter was used.  Weakness    86 year old male significant history of hypertension, anxiety, hyperlipidemia brought here via EMS from home with complaints of weakness.  Patient reportedly lives at home by himself.  For the past several days he has had persistent sneezing and congestion and this morning upon waking he reported he felt very very weak and was having trouble with walking due to weakness and dizziness.  He also complaining of bodyaches.  He does not endorse any headache, vision changes, sore throat, productive cough, hemoptysis, shortness of breath, chest pain, abdominal pain, dysuria, focal numbness or focal weakness.  He does have a left foot drop that he uses a cane but denies any worsening leg weakness.  He denies any recent sick contact.  He is not on any blood thinner medication.  He lives at home by himself.  Denies nausea vomiting or diarrhea.  Home Medications Prior to Admission medications   Medication Sig Start Date End Date Taking? Authorizing Provider  acetaminophen (TYLENOL) 500 MG tablet Take 500-1,000 mg by mouth 2 (two) times daily.    [provider]  ALPRAZolam Duanne Moron) 0.5 MG tablet Take 0.5 mg by mouth daily as needed for anxiety. 01/19/20   [provider]  amLODipine (NORVASC) 5 MG tablet Take 1 tablet (5 mg total) by mouth daily. 10/14/21   Minus Breeding, MD  aspirin 81 MG chewable tablet Chew 1 tablet by mouth daily.    [provider]  bismuth subsalicylate (PEPTO BISMOL) 262 MG/15ML suspension Take 30 mLs by mouth every 6 (six) hours as needed for indigestion.     [provider]  busPIRone (BUSPAR) 7.5 MG tablet  Take 7.5 mg by mouth 2 (two) times daily. 10/08/20   [provider]  COMBIGAN 0.2-0.5 % ophthalmic solution Apply 1 drop to eye 2 (two) times daily. 10/08/20   [provider]  dicyclomine (BENTYL) 10 MG capsule Take 1 capsule (10 mg total) by mouth in the morning and at bedtime. 10/29/20   Irene Shipper, MD  docusate sodium (COLACE) 100 MG capsule Take 1 capsule (100 mg total) by mouth daily as needed for up to 30 doses. 03/05/20   Janith Lima, MD  latanoprost (XALATAN) 0.005 % ophthalmic solution Place 1 drop into both eyes at bedtime. 09/02/20   [provider]  levothyroxine (SYNTHROID) 75 MCG tablet Take 75 mcg by mouth daily before breakfast.    [provider]  losartan (COZAAR) 50 MG tablet Take 50 mg by mouth daily. 05/17/20   [provider]  pantoprazole (PROTONIX) 40 MG tablet Take 40 mg by mouth as needed. 05/19/20   [provider]  sertraline (ZOLOFT) 100 MG tablet Take 1 tablet (100 mg total) by mouth at bedtime. 06/06/21   Irene Shipper, MD  sildenafil (VIAGRA) 100 MG tablet Take 100 mg by mouth daily as needed for erectile dysfunction. pulmonary hypertension    [provider]  tamsulosin (FLOMAX) 0.4 MG CAPS capsule Take 0.4 mg by mouth daily. 10/13/15   [provider]      Allergies    Patient has no known allergies.  Review of Systems   Review of Systems  Neurological:  Positive for weakness.  All other systems reviewed and are negative.   Physical Exam Updated Vital Signs BP (!) 162/101   Pulse 80   Temp 98.3 F (36.8 C) (Oral)   Resp 16   Ht '5\' 9"'$  (1.753 m)   Wt 80.7 kg   SpO2 96%   BMI 26.29 kg/m  Physical Exam Vitals and nursing note reviewed.  Constitutional:      General: He is not in acute distress.    Appearance: He is well-developed.  HENT:     Head: Atraumatic.  Eyes:     Conjunctiva/sclera: Conjunctivae normal.  Cardiovascular:     Rate and Rhythm: Normal rate and regular  rhythm.     Pulses: Normal pulses.     Heart sounds: Normal heart sounds.  Pulmonary:     Effort: Pulmonary effort is normal.     Breath sounds: Normal breath sounds. No wheezing, rhonchi or rales.  Abdominal:     Palpations: Abdomen is soft.     Tenderness: There is no abdominal tenderness.  Musculoskeletal:     Cervical back: Neck supple.     Comments: 5 out of 5 strength all 4 extremities  Skin:    Findings: No rash.  Neurological:     Mental Status: He is alert and oriented to person, place, and time.     GCS: GCS eye subscore is 4. GCS verbal subscore is 5. GCS motor subscore is 6.     Cranial Nerves: Cranial nerves 2-12 are intact.     Sensory: Sensation is intact.     Motor: Motor function is intact.  Psychiatric:        Mood and Affect: Mood normal.     ED Results / Procedures / Treatments   Labs (all labs ordered are listed, but only abnormal results are displayed) Labs Reviewed  RESP PANEL BY RT-PCR (FLU A&B, COVID) ARPGX2 - Abnormal; Notable for the following components:      Result Value   SARS Coronavirus 2 by RT PCR POSITIVE (*)    All other components within normal limits  BASIC METABOLIC PANEL - Abnormal; Notable for the following components:   Glucose, Bld 114 (*)    All other components within normal limits  CBC - Abnormal; Notable for the following components:   WBC 12.0 (*)    All other components within normal limits  CBG MONITORING, ED - Abnormal; Notable for the following components:   Glucose-Capillary 118 (*)    All other components within normal limits  URINALYSIS, ROUTINE W REFLEX MICROSCOPIC    EKG None ED ECG REPORT   Date: 01/18/2022  Rate: 87  Rhythm: normal sinus rhythm and premature ventricular contractions (PVC)  QRS Axis: left  Intervals: normal  ST/T Wave abnormalities: normal  Conduction Disutrbances:none  Narrative Interpretation:   Old EKG Reviewed: unchanged  I have personally reviewed the EKG tracing and agree with  the computerized printout as noted.   Radiology DG Chest 2 View  Result Date: 01/18/2022 CLINICAL DATA:  weak, cold sxs EXAM: CHEST - 2 VIEW COMPARISON:  05/20/2017 FINDINGS: Cardiac silhouette is unremarkable. No pneumothorax or pleural effusion. The lungs are clear. Aorta is calcified. The visualized skeletal structures are unremarkable. IMPRESSION: No acute cardiopulmonary process. Electronically Signed   By: Sammie Bench M.D.   On: 01/18/2022 10:57    Procedures Procedures    Medications Ordered in ED Medications  sodium chloride 0.9 %  bolus 1,000 mL (1,000 mLs Intravenous New Bag/Given 01/18/22 1056)  acetaminophen (TYLENOL) tablet 1,000 mg (1,000 mg Oral Given 01/18/22 1055)    ED Course/ Medical Decision Making/ A&P Clinical Course as of 01/18/22 1323  Wed Jan 18, 2022  1319 I evaluated this patient at bedside.  He is a 86 year old male presenting with a chief complaint of weakness and fatigue.  He felt a Marvin unsteady on his feet but this has improved after administration of IV fluids.  He has  coronavirus infection. [CC]    Clinical Course User Index [CC] Tretha Sciara, MD                           Medical Decision Making Amount and/or Complexity of Data Reviewed Labs: ordered. Radiology: ordered.  Risk OTC drugs.   BP (!) 162/101   Pulse 80   Temp 98.3 F (36.8 C) (Oral)   Resp 16   Ht '5\' 9"'$  (1.753 m)   Wt 80.7 kg   SpO2 96%   BMI 26.29 kg/m   63:15 AM  86 year old male significant history of hypertension, anxiety, hyperlipidemia brought here via EMS from home with complaints of weakness.  Patient reportedly lives at home by himself.  For the past several days he has had persistent sneezing and congestion and this morning upon waking he reported he felt very very weak and was having trouble with walking due to weakness and dizziness.  He also complaining of bodyaches.  He does not endorse any headache, vision changes, sore throat, productive cough,  hemoptysis, shortness of breath, chest pain, abdominal pain, dysuria, focal numbness or focal weakness.  He does have a left foot drop that he uses a cane but denies any worsening leg weakness.  He denies any recent sick contact.  He is not on any blood thinner medication.  He lives at home by himself.  Denies nausea vomiting or diarrhea.  On exam, patient is resting comfortably in bed appears to be in no acute discomfort.  Head exam unremarkable, ear nose and throat unremarkable, normal heart rate, lungs clear to auscultation abdomen is soft nontender.  He does not have any focal neurodeficit.  He does have a slight left foot drop but this is chronic.  He is answering questions appropriately.  Vital signs remarkable for mildly elevated blood pressure of 162/101.  He is not hypoxic, does not have any fever.  -Labs ordered, independently viewed and interpreted by me.  Labs remarkable for Covid+.  His electrolytes panel are reassuring -The patient was maintained on a cardiac monitor.  I personally viewed and interpreted the cardiac monitored which showed an underlying rhythm of: NSR with PVC -Imaging independently viewed and interpreted by me and I agree with radiologist's interpretation.  Result remarkable for CXR without acute changes -This patient presents to the ED for concern of weakness, this involves an extensive number of treatment options, and is a complaint that carries with it a high risk of complications and morbidity.  The differential diagnosis includes covid, flu, electrolytes derangement, pna, uti, stroke, mi -Co morbidities that complicate the patient evaluation includes HLD, GERD, HTN -Treatment includes IVF, tylenol -Reevaluation of the patient after these medicines showed that the patient improved -PCP office notes or outside notes reviewed -Discussion with attending DR. Countryman -Escalation to admission/observation considered: patients feels much better, is comfortable with  discharge, and will follow up with PCP -Prescription medication considered, patient comfortable with Paxlovid and supportive care -  Social Determinant of Health considered   1:08 PM Workup remarkable for positive COVID infection.  This is consistent with patient's presentation.  Patient is not hypoxic and he prefers to go home.  Patient qualifies for Paxlovid.  He was made aware of potential side effects and agree with the medication.  Will provide supportive care as well.  Return precaution discussed.          Final Clinical Impression(s) / ED Diagnoses Final diagnoses:  COVID-19 virus infection    Rx / DC Orders ED Discharge Orders          Ordered    nirmatrelvir/ritonavir EUA (PAXLOVID) 20 x 150 MG & 10 x '100MG'$  TABS  2 times daily        01/18/22 1310    acetaminophen (TYLENOL) 500 MG tablet  2 times daily        01/18/22 1310              Domenic Moras, PA-C 01/18/22 1323    Varney Biles, MD 01/19/22 1209

## 2022-01-26 ENCOUNTER — Encounter: Payer: Self-pay | Admitting: Nurse Practitioner

## 2022-01-30 DIAGNOSIS — R0602 Shortness of breath: Secondary | ICD-10-CM | POA: Diagnosis not present

## 2022-01-30 DIAGNOSIS — U071 COVID-19: Secondary | ICD-10-CM | POA: Diagnosis not present

## 2022-01-30 DIAGNOSIS — Z85828 Personal history of other malignant neoplasm of skin: Secondary | ICD-10-CM | POA: Diagnosis not present

## 2022-01-30 DIAGNOSIS — M7981 Nontraumatic hematoma of soft tissue: Secondary | ICD-10-CM | POA: Diagnosis not present

## 2022-01-30 DIAGNOSIS — Z8616 Personal history of COVID-19: Secondary | ICD-10-CM | POA: Diagnosis not present

## 2022-01-30 DIAGNOSIS — L812 Freckles: Secondary | ICD-10-CM | POA: Diagnosis not present

## 2022-01-30 DIAGNOSIS — L570 Actinic keratosis: Secondary | ICD-10-CM | POA: Diagnosis not present

## 2022-01-30 DIAGNOSIS — L821 Other seborrheic keratosis: Secondary | ICD-10-CM | POA: Diagnosis not present

## 2022-02-01 ENCOUNTER — Other Ambulatory Visit (HOSPITAL_COMMUNITY): Payer: Self-pay | Admitting: Internal Medicine

## 2022-02-01 ENCOUNTER — Ambulatory Visit (HOSPITAL_COMMUNITY)
Admission: RE | Admit: 2022-02-01 | Discharge: 2022-02-01 | Disposition: A | Discharge status: Home / Self Care | Payer: Medicare Other | Source: Ambulatory Visit | Attending: Internal Medicine | Admitting: Internal Medicine

## 2022-02-01 DIAGNOSIS — R55 Syncope and collapse: Secondary | ICD-10-CM | POA: Diagnosis not present

## 2022-02-01 DIAGNOSIS — M7918 Myalgia, other site: Secondary | ICD-10-CM | POA: Diagnosis not present

## 2022-02-01 DIAGNOSIS — E039 Hypothyroidism, unspecified: Secondary | ICD-10-CM | POA: Diagnosis not present

## 2022-02-01 DIAGNOSIS — R2681 Unsteadiness on feet: Secondary | ICD-10-CM | POA: Diagnosis not present

## 2022-02-01 DIAGNOSIS — L989 Disorder of the skin and subcutaneous tissue, unspecified: Secondary | ICD-10-CM | POA: Diagnosis not present

## 2022-02-01 DIAGNOSIS — I10 Essential (primary) hypertension: Secondary | ICD-10-CM | POA: Diagnosis not present

## 2022-02-01 DIAGNOSIS — S060X9A Concussion with loss of consciousness of unspecified duration, initial encounter: Secondary | ICD-10-CM | POA: Diagnosis not present

## 2022-02-01 DIAGNOSIS — R5383 Other fatigue: Secondary | ICD-10-CM | POA: Diagnosis not present

## 2022-02-01 DIAGNOSIS — R339 Retention of urine, unspecified: Secondary | ICD-10-CM | POA: Diagnosis not present

## 2022-02-01 DIAGNOSIS — M21372 Foot drop, left foot: Secondary | ICD-10-CM | POA: Diagnosis not present

## 2022-02-07 ENCOUNTER — Telehealth: Payer: Self-pay

## 2022-02-07 NOTE — Telephone Encounter (Signed)
     Patient  visit on 01/18/2022  at [facility] was for COVID-19.  Have you been able to follow up with your primary care physician? Yes  The patient was or was not able to obtain any needed medicine or equipment. Patient obtained medication.  Are there diet recommendations that you are having difficulty following? No dietary recommendations.   Patient expresses understanding of discharge instructions and education provided has no other needs at this time.    [place] Resource Care Guide   [email or web address]  ?? [phone]   Website: [email or web address]  [email or web address]

## 2022-02-17 DIAGNOSIS — R339 Retention of urine, unspecified: Secondary | ICD-10-CM | POA: Diagnosis not present

## 2022-02-23 DIAGNOSIS — I10 Essential (primary) hypertension: Secondary | ICD-10-CM | POA: Diagnosis not present

## 2022-02-23 DIAGNOSIS — L989 Disorder of the skin and subcutaneous tissue, unspecified: Secondary | ICD-10-CM | POA: Diagnosis not present

## 2022-02-23 DIAGNOSIS — E039 Hypothyroidism, unspecified: Secondary | ICD-10-CM | POA: Diagnosis not present

## 2022-02-23 DIAGNOSIS — M7918 Myalgia, other site: Secondary | ICD-10-CM | POA: Diagnosis not present

## 2022-02-23 DIAGNOSIS — R339 Retention of urine, unspecified: Secondary | ICD-10-CM | POA: Diagnosis not present

## 2022-02-23 DIAGNOSIS — I700 Atherosclerosis of aorta: Secondary | ICD-10-CM | POA: Diagnosis not present

## 2022-02-23 DIAGNOSIS — M21372 Foot drop, left foot: Secondary | ICD-10-CM | POA: Diagnosis not present

## 2022-02-24 DIAGNOSIS — R338 Other retention of urine: Secondary | ICD-10-CM | POA: Diagnosis not present

## 2022-02-24 DIAGNOSIS — R4781 Slurred speech: Secondary | ICD-10-CM | POA: Diagnosis not present

## 2022-02-24 DIAGNOSIS — K222 Esophageal obstruction: Secondary | ICD-10-CM | POA: Diagnosis not present

## 2022-02-24 DIAGNOSIS — I493 Ventricular premature depolarization: Secondary | ICD-10-CM | POA: Diagnosis not present

## 2022-02-24 DIAGNOSIS — I700 Atherosclerosis of aorta: Secondary | ICD-10-CM | POA: Diagnosis not present

## 2022-02-24 DIAGNOSIS — L989 Disorder of the skin and subcutaneous tissue, unspecified: Secondary | ICD-10-CM | POA: Diagnosis not present

## 2022-02-24 DIAGNOSIS — N390 Urinary tract infection, site not specified: Secondary | ICD-10-CM | POA: Diagnosis not present

## 2022-02-24 DIAGNOSIS — I10 Essential (primary) hypertension: Secondary | ICD-10-CM | POA: Diagnosis not present

## 2022-02-24 DIAGNOSIS — G629 Polyneuropathy, unspecified: Secondary | ICD-10-CM | POA: Diagnosis not present

## 2022-02-24 DIAGNOSIS — M810 Age-related osteoporosis without current pathological fracture: Secondary | ICD-10-CM | POA: Diagnosis not present

## 2022-02-24 DIAGNOSIS — E039 Hypothyroidism, unspecified: Secondary | ICD-10-CM | POA: Diagnosis not present

## 2022-02-24 DIAGNOSIS — S060X9D Concussion with loss of consciousness of unspecified duration, subsequent encounter: Secondary | ICD-10-CM | POA: Diagnosis not present

## 2022-02-24 DIAGNOSIS — K2270 Barrett's esophagus without dysplasia: Secondary | ICD-10-CM | POA: Diagnosis not present

## 2022-02-24 DIAGNOSIS — M170 Bilateral primary osteoarthritis of knee: Secondary | ICD-10-CM | POA: Diagnosis not present

## 2022-02-24 DIAGNOSIS — M21371 Foot drop, right foot: Secondary | ICD-10-CM | POA: Diagnosis not present

## 2022-02-24 DIAGNOSIS — M21372 Foot drop, left foot: Secondary | ICD-10-CM | POA: Diagnosis not present

## 2022-02-24 DIAGNOSIS — K219 Gastro-esophageal reflux disease without esophagitis: Secondary | ICD-10-CM | POA: Diagnosis not present

## 2022-02-24 DIAGNOSIS — H8110 Benign paroxysmal vertigo, unspecified ear: Secondary | ICD-10-CM | POA: Diagnosis not present

## 2022-02-24 DIAGNOSIS — F32A Depression, unspecified: Secondary | ICD-10-CM | POA: Diagnosis not present

## 2022-02-24 DIAGNOSIS — M48061 Spinal stenosis, lumbar region without neurogenic claudication: Secondary | ICD-10-CM | POA: Diagnosis not present

## 2022-02-24 DIAGNOSIS — H9190 Unspecified hearing loss, unspecified ear: Secondary | ICD-10-CM | POA: Diagnosis not present

## 2022-02-26 DIAGNOSIS — G629 Polyneuropathy, unspecified: Secondary | ICD-10-CM | POA: Diagnosis not present

## 2022-02-26 DIAGNOSIS — M170 Bilateral primary osteoarthritis of knee: Secondary | ICD-10-CM | POA: Diagnosis not present

## 2022-02-26 DIAGNOSIS — K219 Gastro-esophageal reflux disease without esophagitis: Secondary | ICD-10-CM | POA: Diagnosis not present

## 2022-02-26 DIAGNOSIS — R338 Other retention of urine: Secondary | ICD-10-CM | POA: Diagnosis not present

## 2022-02-26 DIAGNOSIS — I700 Atherosclerosis of aorta: Secondary | ICD-10-CM | POA: Diagnosis not present

## 2022-02-26 DIAGNOSIS — M810 Age-related osteoporosis without current pathological fracture: Secondary | ICD-10-CM | POA: Diagnosis not present

## 2022-02-26 DIAGNOSIS — H9190 Unspecified hearing loss, unspecified ear: Secondary | ICD-10-CM | POA: Diagnosis not present

## 2022-02-26 DIAGNOSIS — L989 Disorder of the skin and subcutaneous tissue, unspecified: Secondary | ICD-10-CM | POA: Diagnosis not present

## 2022-02-26 DIAGNOSIS — I10 Essential (primary) hypertension: Secondary | ICD-10-CM | POA: Diagnosis not present

## 2022-02-26 DIAGNOSIS — I493 Ventricular premature depolarization: Secondary | ICD-10-CM | POA: Diagnosis not present

## 2022-02-26 DIAGNOSIS — F32A Depression, unspecified: Secondary | ICD-10-CM | POA: Diagnosis not present

## 2022-02-26 DIAGNOSIS — R4781 Slurred speech: Secondary | ICD-10-CM | POA: Diagnosis not present

## 2022-02-26 DIAGNOSIS — M21371 Foot drop, right foot: Secondary | ICD-10-CM | POA: Diagnosis not present

## 2022-02-26 DIAGNOSIS — M21372 Foot drop, left foot: Secondary | ICD-10-CM | POA: Diagnosis not present

## 2022-02-26 DIAGNOSIS — M48061 Spinal stenosis, lumbar region without neurogenic claudication: Secondary | ICD-10-CM | POA: Diagnosis not present

## 2022-02-26 DIAGNOSIS — N390 Urinary tract infection, site not specified: Secondary | ICD-10-CM | POA: Diagnosis not present

## 2022-02-26 DIAGNOSIS — K222 Esophageal obstruction: Secondary | ICD-10-CM | POA: Diagnosis not present

## 2022-02-26 DIAGNOSIS — H8110 Benign paroxysmal vertigo, unspecified ear: Secondary | ICD-10-CM | POA: Diagnosis not present

## 2022-02-26 DIAGNOSIS — E039 Hypothyroidism, unspecified: Secondary | ICD-10-CM | POA: Diagnosis not present

## 2022-02-26 DIAGNOSIS — S060X9D Concussion with loss of consciousness of unspecified duration, subsequent encounter: Secondary | ICD-10-CM | POA: Diagnosis not present

## 2022-02-26 DIAGNOSIS — K2270 Barrett's esophagus without dysplasia: Secondary | ICD-10-CM | POA: Diagnosis not present

## 2022-02-27 DIAGNOSIS — R338 Other retention of urine: Secondary | ICD-10-CM | POA: Diagnosis not present

## 2022-02-27 DIAGNOSIS — S060X9D Concussion with loss of consciousness of unspecified duration, subsequent encounter: Secondary | ICD-10-CM | POA: Diagnosis not present

## 2022-02-27 DIAGNOSIS — M810 Age-related osteoporosis without current pathological fracture: Secondary | ICD-10-CM | POA: Diagnosis not present

## 2022-02-27 DIAGNOSIS — E039 Hypothyroidism, unspecified: Secondary | ICD-10-CM | POA: Diagnosis not present

## 2022-02-27 DIAGNOSIS — F32A Depression, unspecified: Secondary | ICD-10-CM | POA: Diagnosis not present

## 2022-02-27 DIAGNOSIS — M21371 Foot drop, right foot: Secondary | ICD-10-CM | POA: Diagnosis not present

## 2022-02-27 DIAGNOSIS — M170 Bilateral primary osteoarthritis of knee: Secondary | ICD-10-CM | POA: Diagnosis not present

## 2022-02-27 DIAGNOSIS — H9190 Unspecified hearing loss, unspecified ear: Secondary | ICD-10-CM | POA: Diagnosis not present

## 2022-02-27 DIAGNOSIS — K222 Esophageal obstruction: Secondary | ICD-10-CM | POA: Diagnosis not present

## 2022-02-27 DIAGNOSIS — M48061 Spinal stenosis, lumbar region without neurogenic claudication: Secondary | ICD-10-CM | POA: Diagnosis not present

## 2022-02-27 DIAGNOSIS — I493 Ventricular premature depolarization: Secondary | ICD-10-CM | POA: Diagnosis not present

## 2022-02-27 DIAGNOSIS — M21372 Foot drop, left foot: Secondary | ICD-10-CM | POA: Diagnosis not present

## 2022-02-27 DIAGNOSIS — I700 Atherosclerosis of aorta: Secondary | ICD-10-CM | POA: Diagnosis not present

## 2022-02-27 DIAGNOSIS — G629 Polyneuropathy, unspecified: Secondary | ICD-10-CM | POA: Diagnosis not present

## 2022-02-27 DIAGNOSIS — R4781 Slurred speech: Secondary | ICD-10-CM | POA: Diagnosis not present

## 2022-02-27 DIAGNOSIS — L989 Disorder of the skin and subcutaneous tissue, unspecified: Secondary | ICD-10-CM | POA: Diagnosis not present

## 2022-02-27 DIAGNOSIS — I10 Essential (primary) hypertension: Secondary | ICD-10-CM | POA: Diagnosis not present

## 2022-02-27 DIAGNOSIS — K219 Gastro-esophageal reflux disease without esophagitis: Secondary | ICD-10-CM | POA: Diagnosis not present

## 2022-02-27 DIAGNOSIS — H8110 Benign paroxysmal vertigo, unspecified ear: Secondary | ICD-10-CM | POA: Diagnosis not present

## 2022-02-27 DIAGNOSIS — K2270 Barrett's esophagus without dysplasia: Secondary | ICD-10-CM | POA: Diagnosis not present

## 2022-02-27 DIAGNOSIS — N390 Urinary tract infection, site not specified: Secondary | ICD-10-CM | POA: Diagnosis not present

## 2022-02-28 DIAGNOSIS — R5383 Other fatigue: Secondary | ICD-10-CM | POA: Diagnosis not present

## 2022-02-28 DIAGNOSIS — N390 Urinary tract infection, site not specified: Secondary | ICD-10-CM | POA: Diagnosis not present

## 2022-03-01 IMAGING — CT CT ABD-PELV W/O CM
2 of 4 series · 12 of 46 positions shown, 14 images · non-contrast
Comparison: CT abdomen 04/14/2016 CT L-spine 10/30/2016

CLINICAL DATA: Unexplained weight loss. 7 pound weight loss in 1
year. History of diverticulosis. Benign LEFT adrenal adenoma.

EXAM:
CT ABDOMEN AND PELVIS WITHOUT CONTRAST
TECHNIQUE: Multidetector CT imaging of the abdomen and pelvis was performed
following the standard protocol without IV contrast.

[Series 2: routine abdomen pelvis without 5.00 br40 s3 axial · axial · non-contrast · 0.51mm/px · z∈[+1147,+1582]mm · 9 of 105 slices shown, 11 images]
[im 9/105  soft-tissue]
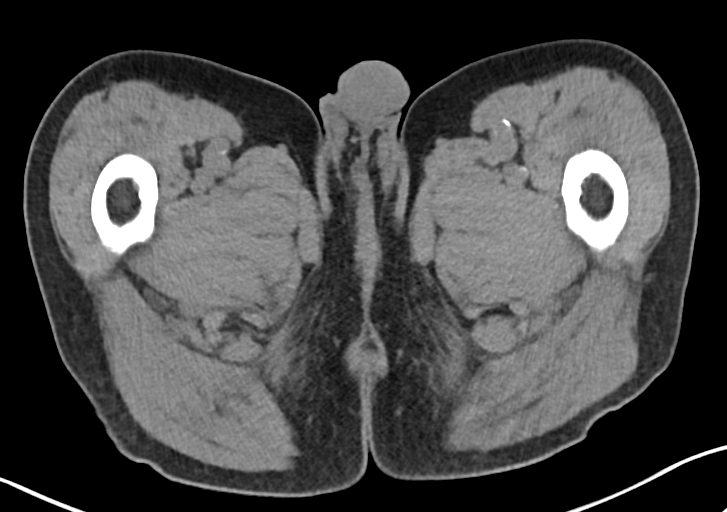
[im 9/105  bone]
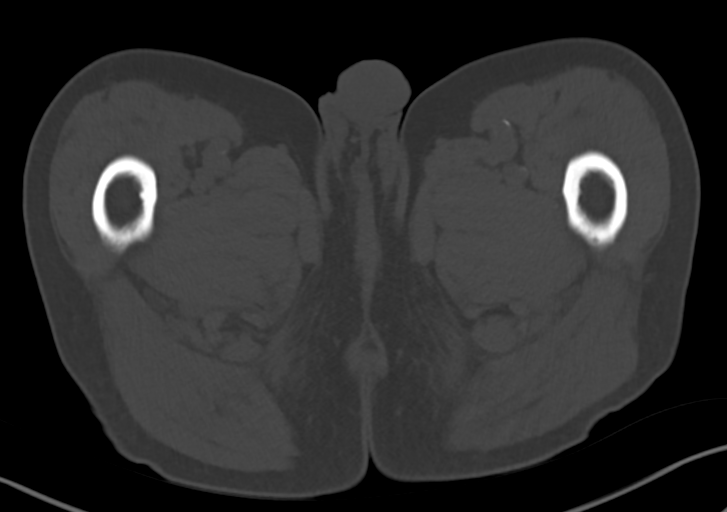
[im 21/105  soft-tissue]
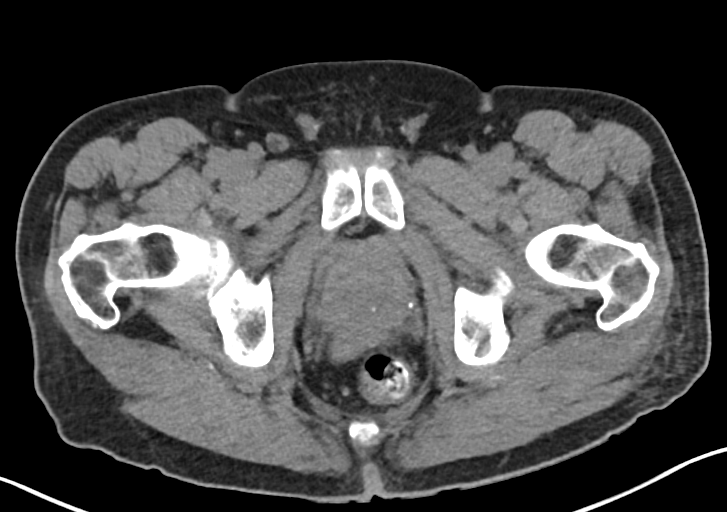
[im 30/105  soft-tissue]
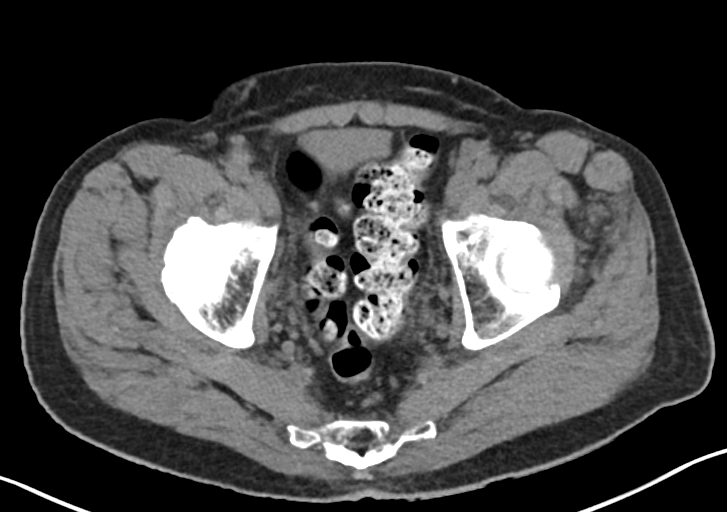
[im 42/105  soft-tissue]
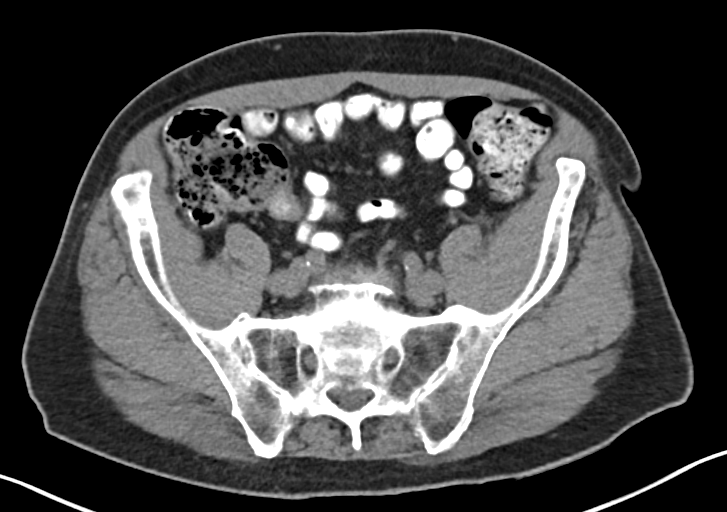
[im 55/105  soft-tissue]
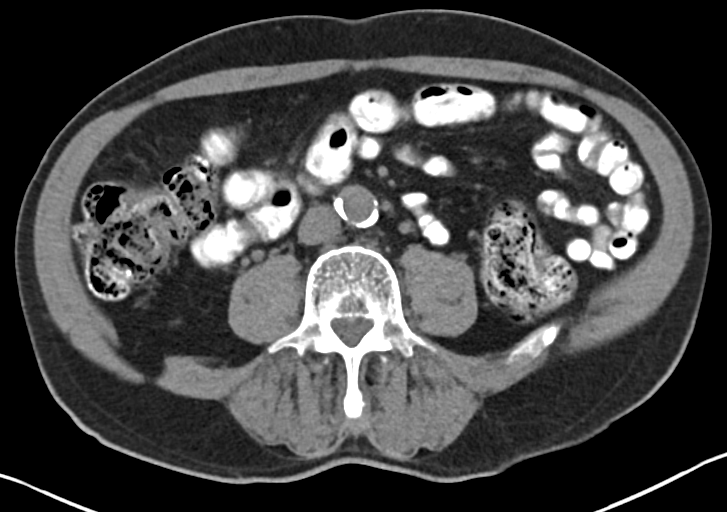
[im 63/105  soft-tissue]
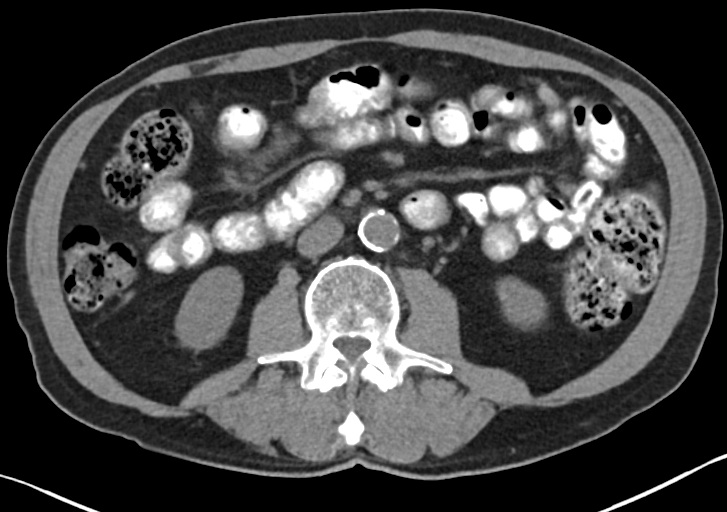
[im 75/105  soft-tissue]
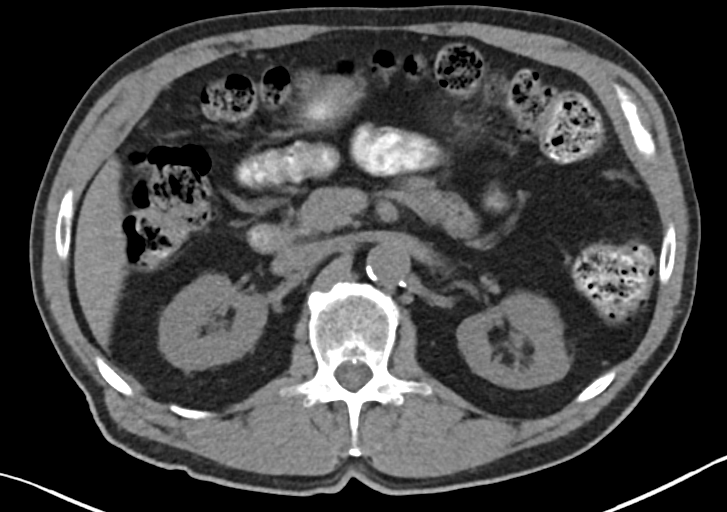
[im 88/105  soft-tissue]
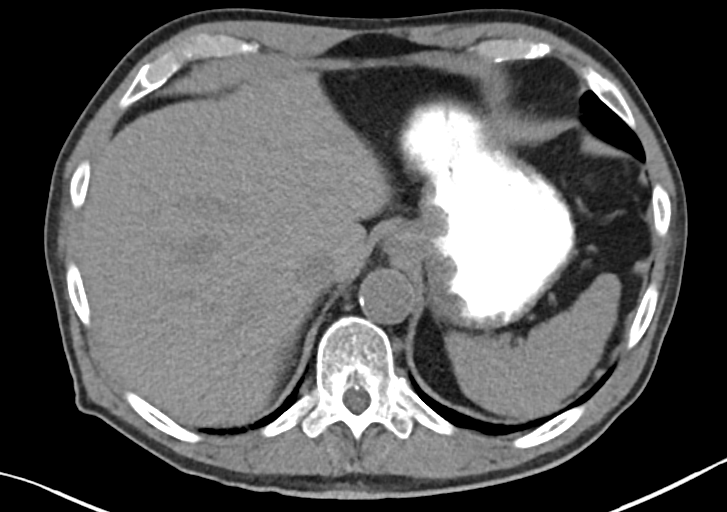
[im 96/105  soft-tissue]
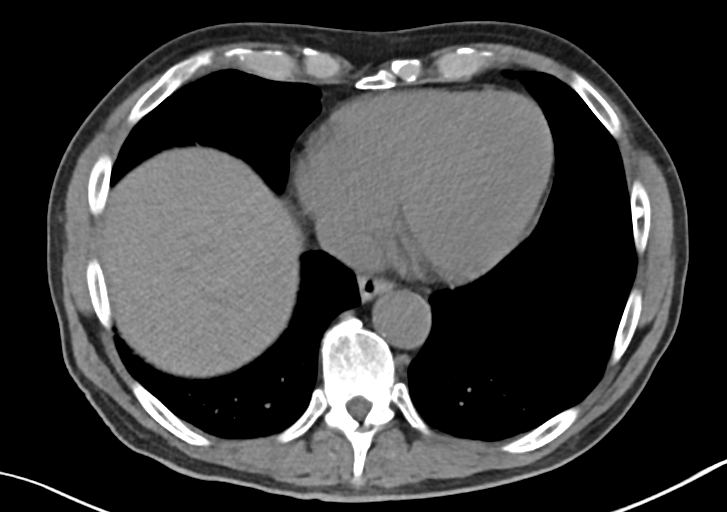
[im 96/105  bone]
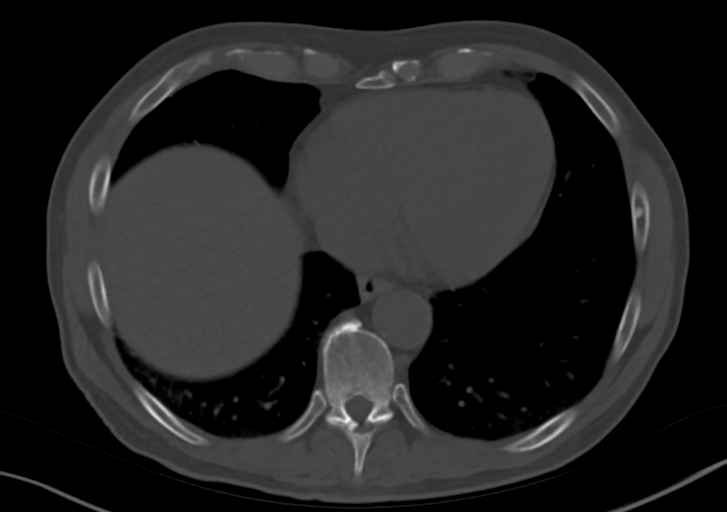

[Series 4: routine abdomen pelvis without 2.00 br40 s3 cor · coronal · non-contrast · 0.72mm/px · 3 of 129 slices shown]
[im 43/129  soft-tissue]
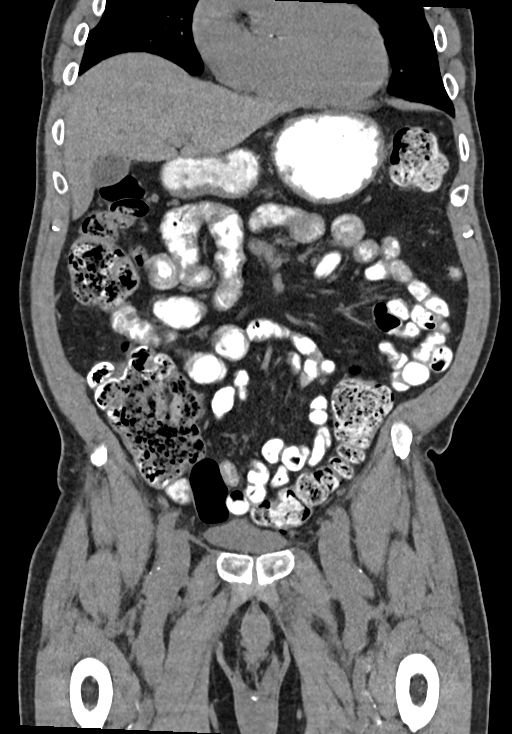
[im 57/129  soft-tissue]
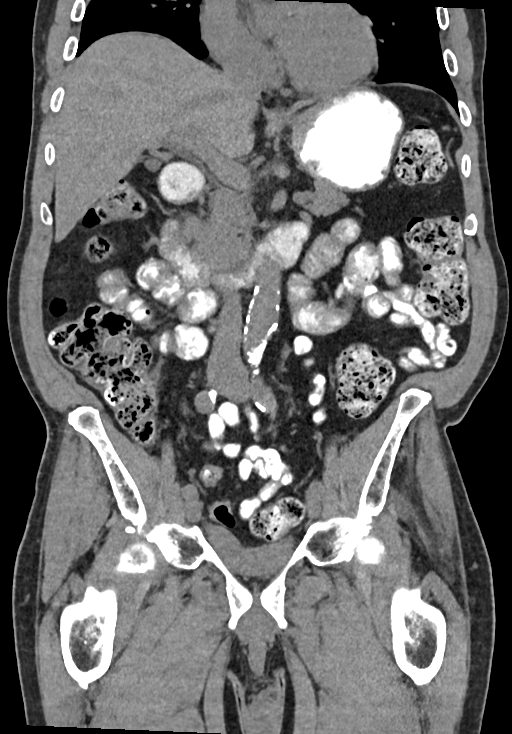
[im 72/129  soft-tissue]
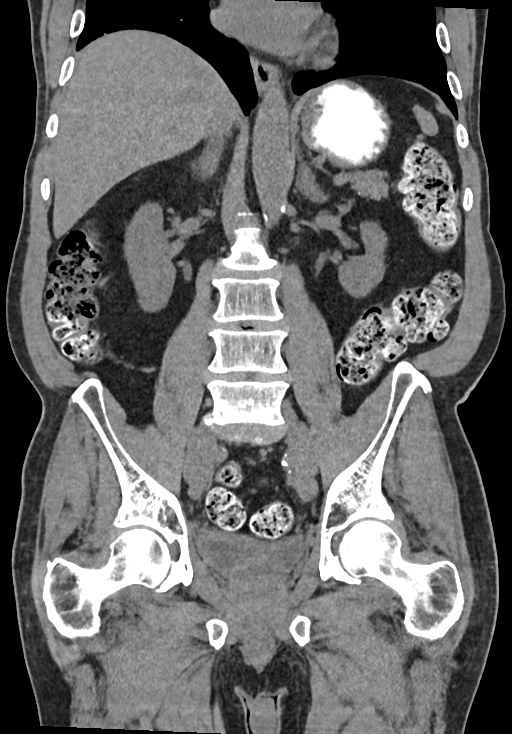

[12 of 46 positions shown; findings below may reference images not displayed]

FINDINGS: Lower chest: Lung bases are clear.

Hepatobiliary: No focal hepatic lesion on non IV contrast exam.
Gallbladder normal. No biliary duct dilatation evident. No biliary
duct dilatation. Common bile duct is normal.

Pancreas: Pancreas is normal on non IV contrast exam. No ductal
dilatation. No pancreatic inflammation.

Spleen: Normal spleen

Adrenals/urinary tract: Small benign LEFT adrenal adenoma again
noted. Kidneys, ureters and bladder normal.

Stomach/Bowel: Stomach, small bowel, appendix, and cecum are
normal. The colon and rectosigmoid colon are normal.

Vascular/Lymphatic: Abdominal aorta is normal caliber with
atherosclerotic calcification. There is no retroperitoneal or
periportal lymphadenopathy. No pelvic lymphadenopathy.

Reproductive: Prostate gland mildly enlarged at 5.0 x 4.4 x 4.9 cm
(volume = 56 cm^3).

Other: No free fluid.

Musculoskeletal: Bilateral pars defects at L5 with 7 mm of
anterolisthesis of L5 on S1 increased from 5 mm on 10/30/2016.
IMPRESSION: 1. No explanation for weight loss.
2. Mild prostatomegaly.
3. Bilateral pars defects at L5 with grade 1 anterolisthesis mildly
progressed from 4099.
4. Aortic Atherosclerosis ([id number]).

## 2022-03-03 DIAGNOSIS — F32A Depression, unspecified: Secondary | ICD-10-CM | POA: Diagnosis not present

## 2022-03-03 DIAGNOSIS — K2270 Barrett's esophagus without dysplasia: Secondary | ICD-10-CM | POA: Diagnosis not present

## 2022-03-03 DIAGNOSIS — K222 Esophageal obstruction: Secondary | ICD-10-CM | POA: Diagnosis not present

## 2022-03-03 DIAGNOSIS — I10 Essential (primary) hypertension: Secondary | ICD-10-CM | POA: Diagnosis not present

## 2022-03-03 DIAGNOSIS — K219 Gastro-esophageal reflux disease without esophagitis: Secondary | ICD-10-CM | POA: Diagnosis not present

## 2022-03-03 DIAGNOSIS — M170 Bilateral primary osteoarthritis of knee: Secondary | ICD-10-CM | POA: Diagnosis not present

## 2022-03-03 DIAGNOSIS — L989 Disorder of the skin and subcutaneous tissue, unspecified: Secondary | ICD-10-CM | POA: Diagnosis not present

## 2022-03-03 DIAGNOSIS — H9190 Unspecified hearing loss, unspecified ear: Secondary | ICD-10-CM | POA: Diagnosis not present

## 2022-03-03 DIAGNOSIS — S060X9D Concussion with loss of consciousness of unspecified duration, subsequent encounter: Secondary | ICD-10-CM | POA: Diagnosis not present

## 2022-03-03 DIAGNOSIS — M21372 Foot drop, left foot: Secondary | ICD-10-CM | POA: Diagnosis not present

## 2022-03-03 DIAGNOSIS — M21371 Foot drop, right foot: Secondary | ICD-10-CM | POA: Diagnosis not present

## 2022-03-03 DIAGNOSIS — I700 Atherosclerosis of aorta: Secondary | ICD-10-CM | POA: Diagnosis not present

## 2022-03-03 DIAGNOSIS — E039 Hypothyroidism, unspecified: Secondary | ICD-10-CM | POA: Diagnosis not present

## 2022-03-03 DIAGNOSIS — R338 Other retention of urine: Secondary | ICD-10-CM | POA: Diagnosis not present

## 2022-03-03 DIAGNOSIS — R4781 Slurred speech: Secondary | ICD-10-CM | POA: Diagnosis not present

## 2022-03-03 DIAGNOSIS — I493 Ventricular premature depolarization: Secondary | ICD-10-CM | POA: Diagnosis not present

## 2022-03-03 DIAGNOSIS — H8110 Benign paroxysmal vertigo, unspecified ear: Secondary | ICD-10-CM | POA: Diagnosis not present

## 2022-03-03 DIAGNOSIS — G629 Polyneuropathy, unspecified: Secondary | ICD-10-CM | POA: Diagnosis not present

## 2022-03-03 DIAGNOSIS — M810 Age-related osteoporosis without current pathological fracture: Secondary | ICD-10-CM | POA: Diagnosis not present

## 2022-03-03 DIAGNOSIS — N390 Urinary tract infection, site not specified: Secondary | ICD-10-CM | POA: Diagnosis not present

## 2022-03-03 DIAGNOSIS — M48061 Spinal stenosis, lumbar region without neurogenic claudication: Secondary | ICD-10-CM | POA: Diagnosis not present

## 2022-03-07 DIAGNOSIS — F32A Depression, unspecified: Secondary | ICD-10-CM | POA: Diagnosis not present

## 2022-03-07 DIAGNOSIS — M170 Bilateral primary osteoarthritis of knee: Secondary | ICD-10-CM | POA: Diagnosis not present

## 2022-03-07 DIAGNOSIS — H8110 Benign paroxysmal vertigo, unspecified ear: Secondary | ICD-10-CM | POA: Diagnosis not present

## 2022-03-07 DIAGNOSIS — H9190 Unspecified hearing loss, unspecified ear: Secondary | ICD-10-CM | POA: Diagnosis not present

## 2022-03-07 DIAGNOSIS — K219 Gastro-esophageal reflux disease without esophagitis: Secondary | ICD-10-CM | POA: Diagnosis not present

## 2022-03-07 DIAGNOSIS — G629 Polyneuropathy, unspecified: Secondary | ICD-10-CM | POA: Diagnosis not present

## 2022-03-07 DIAGNOSIS — M21372 Foot drop, left foot: Secondary | ICD-10-CM | POA: Diagnosis not present

## 2022-03-07 DIAGNOSIS — R4781 Slurred speech: Secondary | ICD-10-CM | POA: Diagnosis not present

## 2022-03-07 DIAGNOSIS — L989 Disorder of the skin and subcutaneous tissue, unspecified: Secondary | ICD-10-CM | POA: Diagnosis not present

## 2022-03-07 DIAGNOSIS — M48061 Spinal stenosis, lumbar region without neurogenic claudication: Secondary | ICD-10-CM | POA: Diagnosis not present

## 2022-03-07 DIAGNOSIS — M21371 Foot drop, right foot: Secondary | ICD-10-CM | POA: Diagnosis not present

## 2022-03-07 DIAGNOSIS — I493 Ventricular premature depolarization: Secondary | ICD-10-CM | POA: Diagnosis not present

## 2022-03-07 DIAGNOSIS — K222 Esophageal obstruction: Secondary | ICD-10-CM | POA: Diagnosis not present

## 2022-03-07 DIAGNOSIS — M810 Age-related osteoporosis without current pathological fracture: Secondary | ICD-10-CM | POA: Diagnosis not present

## 2022-03-07 DIAGNOSIS — R338 Other retention of urine: Secondary | ICD-10-CM | POA: Diagnosis not present

## 2022-03-07 DIAGNOSIS — S060X9D Concussion with loss of consciousness of unspecified duration, subsequent encounter: Secondary | ICD-10-CM | POA: Diagnosis not present

## 2022-03-07 DIAGNOSIS — K2270 Barrett's esophagus without dysplasia: Secondary | ICD-10-CM | POA: Diagnosis not present

## 2022-03-07 DIAGNOSIS — I10 Essential (primary) hypertension: Secondary | ICD-10-CM | POA: Diagnosis not present

## 2022-03-07 DIAGNOSIS — E039 Hypothyroidism, unspecified: Secondary | ICD-10-CM | POA: Diagnosis not present

## 2022-03-07 DIAGNOSIS — I700 Atherosclerosis of aorta: Secondary | ICD-10-CM | POA: Diagnosis not present

## 2022-03-07 DIAGNOSIS — N390 Urinary tract infection, site not specified: Secondary | ICD-10-CM | POA: Diagnosis not present

## 2022-03-09 DIAGNOSIS — R3914 Feeling of incomplete bladder emptying: Secondary | ICD-10-CM | POA: Diagnosis not present

## 2022-03-13 DIAGNOSIS — M7918 Myalgia, other site: Secondary | ICD-10-CM | POA: Diagnosis not present

## 2022-03-13 DIAGNOSIS — R2681 Unsteadiness on feet: Secondary | ICD-10-CM | POA: Diagnosis not present

## 2022-03-13 DIAGNOSIS — M170 Bilateral primary osteoarthritis of knee: Secondary | ICD-10-CM | POA: Diagnosis not present

## 2022-03-13 DIAGNOSIS — I10 Essential (primary) hypertension: Secondary | ICD-10-CM | POA: Diagnosis not present

## 2022-03-13 DIAGNOSIS — M21372 Foot drop, left foot: Secondary | ICD-10-CM | POA: Diagnosis not present

## 2022-03-13 DIAGNOSIS — H8110 Benign paroxysmal vertigo, unspecified ear: Secondary | ICD-10-CM | POA: Diagnosis not present

## 2022-03-13 DIAGNOSIS — R339 Retention of urine, unspecified: Secondary | ICD-10-CM | POA: Diagnosis not present

## 2022-03-13 DIAGNOSIS — R5383 Other fatigue: Secondary | ICD-10-CM | POA: Diagnosis not present

## 2022-03-13 DIAGNOSIS — M1712 Unilateral primary osteoarthritis, left knee: Secondary | ICD-10-CM | POA: Diagnosis not present

## 2022-03-13 DIAGNOSIS — E039 Hypothyroidism, unspecified: Secondary | ICD-10-CM | POA: Diagnosis not present

## 2022-03-15 DIAGNOSIS — K219 Gastro-esophageal reflux disease without esophagitis: Secondary | ICD-10-CM | POA: Diagnosis not present

## 2022-03-15 DIAGNOSIS — F32A Depression, unspecified: Secondary | ICD-10-CM | POA: Diagnosis not present

## 2022-03-15 DIAGNOSIS — L989 Disorder of the skin and subcutaneous tissue, unspecified: Secondary | ICD-10-CM | POA: Diagnosis not present

## 2022-03-15 DIAGNOSIS — K2270 Barrett's esophagus without dysplasia: Secondary | ICD-10-CM | POA: Diagnosis not present

## 2022-03-15 DIAGNOSIS — H8110 Benign paroxysmal vertigo, unspecified ear: Secondary | ICD-10-CM | POA: Diagnosis not present

## 2022-03-15 DIAGNOSIS — M21371 Foot drop, right foot: Secondary | ICD-10-CM | POA: Diagnosis not present

## 2022-03-15 DIAGNOSIS — E039 Hypothyroidism, unspecified: Secondary | ICD-10-CM | POA: Diagnosis not present

## 2022-03-15 DIAGNOSIS — I493 Ventricular premature depolarization: Secondary | ICD-10-CM | POA: Diagnosis not present

## 2022-03-15 DIAGNOSIS — S060X9D Concussion with loss of consciousness of unspecified duration, subsequent encounter: Secondary | ICD-10-CM | POA: Diagnosis not present

## 2022-03-15 DIAGNOSIS — I10 Essential (primary) hypertension: Secondary | ICD-10-CM | POA: Diagnosis not present

## 2022-03-15 DIAGNOSIS — R4781 Slurred speech: Secondary | ICD-10-CM | POA: Diagnosis not present

## 2022-03-15 DIAGNOSIS — R338 Other retention of urine: Secondary | ICD-10-CM | POA: Diagnosis not present

## 2022-03-15 DIAGNOSIS — M170 Bilateral primary osteoarthritis of knee: Secondary | ICD-10-CM | POA: Diagnosis not present

## 2022-03-15 DIAGNOSIS — N390 Urinary tract infection, site not specified: Secondary | ICD-10-CM | POA: Diagnosis not present

## 2022-03-15 DIAGNOSIS — M21372 Foot drop, left foot: Secondary | ICD-10-CM | POA: Diagnosis not present

## 2022-03-15 DIAGNOSIS — I700 Atherosclerosis of aorta: Secondary | ICD-10-CM | POA: Diagnosis not present

## 2022-03-15 DIAGNOSIS — H9190 Unspecified hearing loss, unspecified ear: Secondary | ICD-10-CM | POA: Diagnosis not present

## 2022-03-15 DIAGNOSIS — M810 Age-related osteoporosis without current pathological fracture: Secondary | ICD-10-CM | POA: Diagnosis not present

## 2022-03-15 DIAGNOSIS — G629 Polyneuropathy, unspecified: Secondary | ICD-10-CM | POA: Diagnosis not present

## 2022-03-15 DIAGNOSIS — M48061 Spinal stenosis, lumbar region without neurogenic claudication: Secondary | ICD-10-CM | POA: Diagnosis not present

## 2022-03-15 DIAGNOSIS — K222 Esophageal obstruction: Secondary | ICD-10-CM | POA: Diagnosis not present

## 2022-03-28 DIAGNOSIS — N390 Urinary tract infection, site not specified: Secondary | ICD-10-CM | POA: Diagnosis not present

## 2022-03-29 DIAGNOSIS — I10 Essential (primary) hypertension: Secondary | ICD-10-CM | POA: Diagnosis not present

## 2022-03-29 DIAGNOSIS — F5101 Primary insomnia: Secondary | ICD-10-CM | POA: Diagnosis not present

## 2022-04-06 DIAGNOSIS — H401122 Primary open-angle glaucoma, left eye, moderate stage: Secondary | ICD-10-CM | POA: Diagnosis not present

## 2022-04-13 DIAGNOSIS — M810 Age-related osteoporosis without current pathological fracture: Secondary | ICD-10-CM | POA: Diagnosis not present

## 2022-04-13 DIAGNOSIS — E039 Hypothyroidism, unspecified: Secondary | ICD-10-CM | POA: Diagnosis not present

## 2022-04-13 DIAGNOSIS — I10 Essential (primary) hypertension: Secondary | ICD-10-CM | POA: Diagnosis not present

## 2022-04-17 ENCOUNTER — Other Ambulatory Visit: Payer: Self-pay | Admitting: Internal Medicine

## 2022-04-17 DIAGNOSIS — F419 Anxiety disorder, unspecified: Secondary | ICD-10-CM

## 2022-04-18 DIAGNOSIS — M810 Age-related osteoporosis without current pathological fracture: Secondary | ICD-10-CM | POA: Diagnosis not present

## 2022-04-18 DIAGNOSIS — I10 Essential (primary) hypertension: Secondary | ICD-10-CM | POA: Diagnosis not present

## 2022-04-18 DIAGNOSIS — R339 Retention of urine, unspecified: Secondary | ICD-10-CM | POA: Diagnosis not present

## 2022-04-18 DIAGNOSIS — G250 Essential tremor: Secondary | ICD-10-CM | POA: Diagnosis not present

## 2022-04-18 DIAGNOSIS — E039 Hypothyroidism, unspecified: Secondary | ICD-10-CM | POA: Diagnosis not present

## 2022-04-18 DIAGNOSIS — K589 Irritable bowel syndrome without diarrhea: Secondary | ICD-10-CM | POA: Diagnosis not present

## 2022-04-18 DIAGNOSIS — M21372 Foot drop, left foot: Secondary | ICD-10-CM | POA: Diagnosis not present

## 2022-04-18 DIAGNOSIS — K219 Gastro-esophageal reflux disease without esophagitis: Secondary | ICD-10-CM | POA: Diagnosis not present

## 2022-04-18 DIAGNOSIS — R413 Other amnesia: Secondary | ICD-10-CM | POA: Diagnosis not present

## 2022-04-18 DIAGNOSIS — M8589 Other specified disorders of bone density and structure, multiple sites: Secondary | ICD-10-CM | POA: Diagnosis not present

## 2022-04-18 DIAGNOSIS — Z0000 Encounter for general adult medical examination without abnormal findings: Secondary | ICD-10-CM | POA: Diagnosis not present

## 2022-04-18 DIAGNOSIS — Z23 Encounter for immunization: Secondary | ICD-10-CM | POA: Diagnosis not present

## 2022-06-05 ENCOUNTER — Ambulatory Visit: Payer: Medicare Other

## 2022-06-19 ENCOUNTER — Ambulatory Visit: Payer: Medicare Other | Admitting: Physical Therapy

## 2022-06-28 ENCOUNTER — Ambulatory Visit: Payer: Medicare Other

## 2022-06-29 ENCOUNTER — Ambulatory Visit: Payer: Medicare Other

## 2022-07-11 ENCOUNTER — Ambulatory Visit: Payer: Medicare Other | Admitting: Neurology

## 2022-07-21 ENCOUNTER — Encounter (HOSPITAL_COMMUNITY): Payer: Self-pay

## 2022-08-10 NOTE — Patient Instructions (Addendum)
SURGICAL WAITING ROOM VISITATION  Patients having surgery or a procedure may have no more than 2 support people in the waiting area - these visitors may rotate.    Children under the age of 42 must have an adult with them who is not the patient.  Due to an increase in RSV and influenza rates and associated hospitalizations, children ages 16 and under may not visit patients in [facility] hospitals.  If the patient needs to stay at the hospital during part of their recovery, the visitor guidelines for inpatient rooms apply. Pre-op nurse will coordinate an appropriate time for 1 support person to accompany patient in pre-op.  This support person may not rotate.    Please refer to the [facility] website for the visitor guidelines for Inpatients (after your surgery is over and you are in a regular room).       Your procedure is scheduled on: 08/22/22   Report to [facility] Main Entrance    Report to admitting at  7:30 AM   Call this number if you have problems the morning of surgery [phone]   Do not eat food :After Midnight.   After Midnight you may have the following liquids until 7 AM DAY OF SURGERY  Water Non-Citrus Juices (without pulp, NO RED-Apple, White grape, White cranberry) Black Coffee (NO MILK/CREAM OR CREAMERS, sugar ok)  Clear Tea (NO MILK/CREAM OR CREAMERS, sugar ok) regular and decaf                             Plain Jell-O (NO RED)                                           Fruit ices (not with fruit pulp, NO RED)                                     Popsicles (NO RED)                                                               Sports drinks like Gatorade (NO RED)                The day of surgery:  Drink ONE (1) Pre-Surgery Clear Ensure at 7 AM the morning of surgery. Drink in one sitting. Do not sip.  This drink was given to you during your hospital  pre-op appointment visit. Nothing else to drink after completing the  Pre-Surgery Clear Ensure       Oral Hygiene is also important to reduce your risk of infection.                                    Remember - BRUSH YOUR TEETH THE MORNING OF SURGERY WITH YOUR REGULAR TOOTHPASTE   Do NOT smoke after Midnight   Take these medicines the morning of surgery with A SIP OF WATER: Tylenol, Xanax, Amlodipine(Norvasc), Buspar, Levothyroxine(Synthroid), Pantaprazole(Protonix)  You may not have any metal on your body including hair pins, jewelry, and body piercing             Do not wear , lotions, powders,cologne, or deodorant              Men may shave face and neck.   Do not bring valuables to the hospital. [facility] IS NOT             RESPONSIBLE   FOR VALUABLES.   Contacts, glasses, dentures or bridgework may not be worn into surgery.   Bring small overnight bag day of surgery.   DO NOT BRING YOUR HOME MEDICATIONS TO THE HOSPITAL. PHARMACY WILL DISPENSE MEDICATIONS LISTED ON YOUR MEDICATION LIST TO YOU DURING YOUR ADMISSION IN THE HOSPITAL!    Patients discharged on the day of surgery will not be allowed to drive home.  Someone NEEDS to stay with you for the first 24 hours after anesthesia.   Special Instructions: Bring a copy of your healthcare power of attorney and living will documents the day of surgery if you haven't scanned them before.              Please read over the following fact sheets you were given: IF YOU HAVE QUESTIONS ABOUT YOUR PRE-OP INSTRUCTIONS PLEASE CALL [phone]   If you received a COVID test during your pre-op visit  it is requested that you wear a mask when out in public, stay away from anyone that may not be feeling well and notify your surgeon if you develop symptoms. If you test positive for Covid or have been in contact with anyone that has tested positive in the last 10 days please notify you surgeon.      Pre-operative 5 CHG Bath Instructions   You can play a key role in reducing the risk of infection after surgery. Your skin  needs to be as free of germs as possible. You can reduce the number of germs on your skin by washing with CHG (chlorhexidine gluconate) soap before surgery. CHG is an antiseptic soap that kills germs and continues to kill germs even after washing.   DO NOT use if you have an allergy to chlorhexidine/CHG or antibacterial soaps. If your skin becomes reddened or irritated, stop using the CHG and notify one of our RNs at [phone].   Please shower with the CHG soap starting 4 days before surgery using the following schedule:     Please keep in mind the following:  DO NOT shave, including legs and underarms, starting the day of your first shower.   You may shave your face at any point before/day of surgery.  Place clean sheets on your bed the day you start using CHG soap. Use a clean washcloth (not used since being washed) for each shower. DO NOT sleep with pets once you start using the CHG.   CHG Shower Instructions:  If you choose to wash your hair and private area, wash first with your normal shampoo/soap.  After you use shampoo/soap, rinse your hair and body thoroughly to remove shampoo/soap residue.  Turn the water OFF and apply about 3 tablespoons (45 ml) of CHG soap to a CLEAN washcloth.  Apply CHG soap ONLY FROM YOUR NECK DOWN TO YOUR TOES (washing for 3-5 minutes)  DO NOT use CHG soap on face, private areas, open wounds, or sores.  Pay special attention to the area where your surgery is being performed.  If you are having back  surgery, having someone wash your back for you may be helpful. Wait 2 minutes after CHG soap is applied, then you may rinse off the CHG soap.  Pat dry with a clean towel  Put on clean clothes/pajamas   If you choose to wear lotion, please use ONLY the CHG-compatible lotions on the back of this paper.     Additional instructions for the day of surgery: DO NOT APPLY any lotions, deodorants, cologne, or perfumes.   Put on clean/comfortable clothes.  Brush  your teeth.  Ask your nurse before applying any prescription medications to the skin.      CHG Compatible Lotions   Aveeno Moisturizing lotion  Cetaphil Moisturizing Cream  Cetaphil Moisturizing Lotion  Clairol Herbal Essence Moisturizing Lotion, Dry Skin  Clairol Herbal Essence Moisturizing Lotion, Extra Dry Skin  Clairol Herbal Essence Moisturizing Lotion, Normal Skin  Curel Age Defying Therapeutic Moisturizing Lotion with Alpha Hydroxy  Curel Extreme Care Body Lotion  Curel Soothing Hands Moisturizing Hand Lotion  Curel Therapeutic Moisturizing Cream, Fragrance-Free  Curel Therapeutic Moisturizing Lotion, Fragrance-Free  Curel Therapeutic Moisturizing Lotion, Original Formula  Eucerin Daily Replenishing Lotion  Eucerin Dry Skin Therapy Plus Alpha Hydroxy Crme  Eucerin Dry Skin Therapy Plus Alpha Hydroxy Lotion  Eucerin Original Crme  Eucerin Original Lotion  Eucerin Plus Crme Eucerin Plus Lotion  Eucerin TriLipid Replenishing Lotion  Keri Anti-Bacterial Hand Lotion  Keri Deep Conditioning Original Lotion Dry Skin Formula Softly Scented  Keri Deep Conditioning Original Lotion, Fragrance Free Sensitive Skin Formula  Keri Lotion Fast Absorbing Fragrance Free Sensitive Skin Formula  Keri Lotion Fast Absorbing Softly Scented Dry Skin Formula  Keri Original Lotion  Keri Skin Renewal Lotion Keri Silky Smooth Lotion  Keri Silky Smooth Sensitive Skin Lotion  Nivea Body Creamy Conditioning Oil  Nivea Body Extra Enriched Teacher, adult education Moisturizing Lotion Nivea Crme  Nivea Skin Firming Lotion  NutraDerm 30 Skin Lotion  NutraDerm Skin Lotion  NutraDerm Therapeutic Skin Cream  NutraDerm Therapeutic Skin Lotion  ProShield Protective Hand Cream  Provon moisturizing lotion

## 2022-08-10 NOTE — Progress Notes (Addendum)
COVID Vaccine received:  []  No [x]  Yes Date of any COVID positive Test in last 90 days:  PCP Merri Brunette MD Cardiologist - Rollene Rotunda MD  Chest x-ray - 01/18/22  EPIC EKG -  01/18/22  EPIC Stress Test - 03/08/18  EPIC ECHO -  Cardiac Cath - nO  Bowel Prep - [x]  No  []   Yes ______  Pacemaker / ICD device [x]  No []  Yes   Spinal Cord Stimulator:[]  No []  Yes       History of Sleep Apnea? [x]  No []  Yes   CPAP used?- [x]  No []  Yes    Does the patient monitor blood sugar?          [x]  No []  Yes  []  N/A  Patient has: [x]  NO Hx DM   []  Pre-DM                 []  DM1  []   DM2 Does patient have a [employer] or Dexacom? [x]  No []  Yes   Fasting Blood Sugar Ranges-  Checks Blood Sugar _____ times a day  GLP1 agonist / usual dose - NO GLP1 instructions:  SGLT-2 inhibitors / usual dose - no SGLT-2 instructions:   Blood Thinner / Instructions: Aspirin Instructions:81MG . hOLD X5 DAYS. lAST DOSE 6.26  Comments: Pt. Has nickel size open area above Right knee where he had growth removed. Area is pink and has no drainage. Instructed pt. To cover with band aid on day of surgery.  Activity level: Patient is able to climb a flight of stairs without difficulty; [x]  No CP  [x]  No SOB,     Patient can perform ADLs without assistance.   Anesthesia review:   Patient denies shortness of breath, fever, cough and chest pain at PAT appointment.  Patient verbalized understanding and agreement to the Pre-Surgical Instructions that were given to them at this PAT appointment. Patient was also educated of the need to review these PAT instructions again prior to his/her surgery.I reviewed the appropriate phone numbers to call if they have any and questions or concerns.

## 2022-08-11 ENCOUNTER — Encounter (HOSPITAL_COMMUNITY)
Admission: RE | Admit: 2022-08-11 | Discharge: 2022-08-11 | Disposition: A | Discharge status: Home / Self Care | Payer: Medicare Other | Source: Ambulatory Visit | Attending: Orthopedic Surgery | Admitting: Orthopedic Surgery

## 2022-08-11 ENCOUNTER — Encounter (HOSPITAL_COMMUNITY): Payer: Self-pay

## 2022-08-11 ENCOUNTER — Other Ambulatory Visit: Payer: Self-pay

## 2022-08-11 VITALS — BP 148/100 | HR 69 | Temp 97.8°F | Resp 20 | Ht 69.0 in | Wt 182.0 lb

## 2022-08-11 DIAGNOSIS — I10 Essential (primary) hypertension: Secondary | ICD-10-CM | POA: Diagnosis not present

## 2022-08-11 DIAGNOSIS — Z01818 Encounter for other preprocedural examination: Secondary | ICD-10-CM | POA: Insufficient documentation

## 2022-08-11 HISTORY — DX: Hypothyroidism, unspecified: E03.9

## 2022-08-11 LAB — SURGICAL PCR SCREEN
MRSA, PCR: NEGATIVE
Staphylococcus aureus: NEGATIVE

## 2022-08-11 LAB — BASIC METABOLIC PANEL
Anion gap: 9 (ref 5–15)
BUN: 23 mg/dL (ref 8–23)
CO2: 23 mmol/L (ref 22–32)
Calcium: 8.6 mg/dL — ABNORMAL LOW (ref 8.9–10.3)
Chloride: 103 mmol/L (ref 98–111)
Creatinine, Ser: 0.95 mg/dL (ref 0.61–1.24)
GFR, Estimated: >60 mL/min (ref >60)
Glucose, Bld: 109 mg/dL — ABNORMAL HIGH (ref 70–99)
Potassium: 4.3 mmol/L (ref 3.5–5.1)
Sodium: 135 mmol/L (ref 135–145)

## 2022-08-11 LAB — CBC
HCT: 43.1 % (ref 39.0–52.0)
Hemoglobin: 14.1 g/dL (ref 13.0–17.0)
MCH: 31.1 pg (ref 26.0–34.0)
MCHC: 32.7 g/dL (ref 30.0–36.0)
MCV: 95.1 fL (ref 80.0–100.0)
Platelets: 237 10*3/uL (ref 150–400)
RBC: 4.53 MIL/uL (ref 4.22–5.81)
RDW: 13.2 % (ref 11.5–15.5)
WBC: 7.6 10*3/uL (ref 4.0–10.5)
nRBC: 0 % (ref 0.0–0.2)

## 2022-08-21 NOTE — H&P (Signed)
TOTAL KNEE ADMISSION H&P  Patient is being admitted for right total knee arthroplasty.  Subjective:  Chief Complaint:right knee pain.  HPI: Amelia Jo., 87 y.o. male, has a history of pain and functional disability in the right knee due to arthritis and has failed non-surgical conservative treatments for greater than 12 weeks to includeNSAID's and/or analgesics, corticosteriod injections, and activity modification.  Onset of symptoms was gradual, starting 2 years ago with gradually worsening course since that time. The patient noted no past surgery on the right knee(s).  Patient currently rates pain in the right knee(s) at 8 out of 10 with activity. Patient has worsening of pain with activity and weight bearing, pain that interferes with activities of daily living, and pain with passive range of motion.  Patient has evidence of joint space narrowing by imaging studies.  There is no active infection.  Patient Active Problem List   Diagnosis Date Noted   DOE (dyspnea on exertion) 03/06/2018   PVC's (premature ventricular contractions) 03/06/2018   Essential hypertension 03/06/2018   E. coli bacteremia 10/17/2015   Catheter-associated urinary tract infection (HCC) 10/14/2015   Sepsis secondary to UTI (HCC) 10/14/2015   PCP NOTES >>>>>>>>>>>> 11/19/2014   Anxiety and depression 03/09/2014   Annual physical exam 03/09/2014   Toe avulsion 08/06/2011   BARRETTS ESOPHAGUS 03/04/2010   IRRITABLE BOWEL SYNDROME 03/04/2010   ESOPHAGEAL STRICTURE 12/16/2009   HIATAL HERNIA WITH REFLUX 12/16/2009   DIVERTICULOSIS, COLON 12/16/2009   COLONIC POLYPS, ADENOMATOUS, HX OF 12/16/2009   Hyperlipidemia 11/23/2005   GERD 11/23/2005   BPH (benign prostatic hyperplasia) 11/23/2005   Past Medical History:  Diagnosis Date   Anxiety and depression    h/o   Arthritis    Barrett's esophagus    Diaphragmatic hernia without mention of obstruction or gangrene    Diverticulosis of colon (without mention  of hemorrhage)    Esophageal reflux    HTN (hypertension)    Hyperlipidemia    Hypertrophy of prostate with urinary obstruction and other lower urinary tract symptoms (LUTS)    Hypothyroidism    Irritable bowel syndrome    Personal history of colonic polyps    Skin cancer    sees Dr Yetta Barre    Stricture and stenosis of esophagus     Past Surgical History:  Procedure Laterality Date   CATARACT EXTRACTION, BILATERAL     COLONOSCOPY     TONSILLECTOMY     TRANSURETHRAL RESECTION OF PROSTATE N/A 03/05/2020   Procedure: TRANSURETHRAL RESECTION OF THE PROSTATE (TURP), BIPOLAR;  Surgeon: Jannifer Hick, MD;  Location: [facility];  Service: Urology;  Laterality: N/A;   UPPER GASTROINTESTINAL ENDOSCOPY      No current facility-administered medications for this encounter.   Current Outpatient Medications  Medication Sig Dispense Refill Last Dose   aspirin 81 MG chewable tablet Chew 1 tablet by mouth daily.      acetaminophen (TYLENOL) 500 MG tablet Take 1-2 tablets (500-1,000 mg total) by mouth 2 (two) times daily. 30 tablet 0    amLODipine (NORVASC) 5 MG tablet Take 1 tablet (5 mg total) by mouth daily. 90 tablet 3    bismuth subsalicylate (PEPTO BISMOL) 262 MG/15ML suspension Take 30 mLs by mouth every 6 (six) hours as needed for indigestion.       buPROPion (WELLBUTRIN XL) 150 MG 24 hr tablet Take 150 mg by mouth daily.      busPIRone (BUSPAR) 7.5 MG tablet Take 7.5 mg by mouth 2 (two) times daily.  COMBIGAN 0.2-0.5 % ophthalmic solution Apply 1 drop to eye 2 (two) times daily.      dicyclomine (BENTYL) 10 MG capsule Take 1 capsule (10 mg total) by mouth in the morning and at bedtime. 180 capsule 0    docusate sodium (COLACE) 100 MG capsule Take 1 capsule (100 mg total) by mouth daily as needed for up to 30 doses. (Patient not taking: Reported on 08/11/2022) 30 capsule 0 Not Taking   latanoprost (XALATAN) 0.005 % ophthalmic solution Place 1 drop into both eyes at bedtime.      levothyroxine  (SYNTHROID) 75 MCG tablet Take 75 mcg by mouth daily before breakfast.      losartan (COZAAR) 50 MG tablet Take 50 mg by mouth daily.      pantoprazole (PROTONIX) 40 MG tablet Take 40 mg by mouth as needed.      sertraline (ZOLOFT) 100 MG tablet TAKE 1 TABLET BY MOUTH AT  BEDTIME 90 tablet 3    sildenafil (VIAGRA) 100 MG tablet Take 100 mg by mouth daily as needed for erectile dysfunction. pulmonary hypertension      tamsulosin (FLOMAX) 0.4 MG CAPS capsule Take 0.4 mg by mouth daily.      No Known Allergies  Social History   Tobacco Use   Smoking status: Former    Types: Cigars   Smokeless tobacco: Never   Tobacco comments:    cigars, quit ~ 2005  Substance Use Topics   Alcohol use: Yes    Alcohol/week: 0.0 standard drinks of alcohol    Comment: 1 drinks per week    Family History  Problem Relation Age of Onset   Heart attack Father 95   Colon cancer Neg Hx    Prostate cancer Neg Hx    Hypertension Neg Hx    Rectal cancer Neg Hx    Stomach cancer Neg Hx      Review of Systems  Constitutional:  Negative for chills and fever.  Respiratory:  Negative for cough and shortness of breath.   Cardiovascular:  Negative for chest pain.  Gastrointestinal:  Negative for nausea and vomiting.  Musculoskeletal:  Positive for arthralgias.     Objective:  Physical Exam Very pleasant 87 year old male awake alert and oriented.  Right knee exam: Slight flexion contracture with flexion to 110 degrees with tightness and pain anterior medially Tenderness medially Stable medial and lateral collateral ligaments He does have evidence of healed abrasions over the anterior aspect of his right knee and proximal right leg from falls related to his foot drop on the left lower extremity  Left knee No significant palpable effusion, warmth erythema Tenderness at the joint lines but less so than the right.  He is noted to have bilateral 2+ edema without erythema  Vital signs in last 24 hours:     Labs:   Estimated body mass index is 26.88 kg/m as calculated from the following:   Height as of 08/11/22: 5\' 9"  (1.753 m).   Weight as of 08/11/22: 82.6 kg.   Imaging Review Plain radiographs demonstrate severe degenerative joint disease of the right knee(s). The overall alignment isneutral. The bone quality appears to be adequate for age and reported activity level.      Assessment/Plan:  End stage arthritis, right knee   The patient history, physical examination, clinical judgment of the provider and imaging studies are consistent with end stage degenerative joint disease of the right knee(s) and total knee arthroplasty is deemed medically necessary. The treatment options including  medical management, injection therapy arthroscopy and arthroplasty were discussed at length. The risks and benefits of total knee arthroplasty were presented and reviewed. The risks due to aseptic loosening, infection, stiffness, patella tracking problems, thromboembolic complications and other imponderables were discussed. The patient acknowledged the explanation, agreed to proceed with the plan and consent was signed. Patient is being admitted for inpatient treatment for surgery, pain control, PT, OT, prophylactic antibiotics, VTE prophylaxis, progressive ambulation and ADL's and discharge planning. The patient is planning to be discharged  home.  Therapy Plans: outpatient therapy at [facility] Disposition: Home with daughters Planned DVT Prophylaxis: aspirin 81mg  BID DME needed: none PCP: Dr. Renne Crigler, will call for clearance TXA: IV Allergies: NKDA Anesthesia Concerns: none BMI: 25.5 Last HgbA1c: Not diabetic   Other: - oxycodone, robaxin tylenol - Asked about HH RN for help with managing meds, etc - we dicussed this, no plan for this at the time - Has to self cath at home sometimes due to difficulty with emptying >> foley first night - No hx of VTE , hx of mild prostate cancer - ice machine ** -  Known left foot drop   Patient's anticipated LOS is less than 2 midnights, meeting these requirements: - Younger than 42 - Lives within 1 hour of care - Has a competent adult at home to recover with post-op recover - NO history of  - Chronic pain requiring opiods  - Diabetes  - Coronary Artery Disease  - Heart failure  - Heart attack  - Stroke  - DVT/VTE  - Cardiac arrhythmia  - Respiratory Failure/COPD  - Renal failure  - Anemia  - Advanced Liver disease  Rosalene Billings, PA-C Orthopedic Surgery [facility] [place] [phone]

## 2022-08-22 ENCOUNTER — Encounter (HOSPITAL_COMMUNITY): Payer: Self-pay | Admitting: Orthopedic Surgery

## 2022-08-22 ENCOUNTER — Ambulatory Visit (HOSPITAL_BASED_OUTPATIENT_CLINIC_OR_DEPARTMENT_OTHER): Payer: Medicare Other | Admitting: Anesthesiology

## 2022-08-22 ENCOUNTER — Other Ambulatory Visit: Payer: Self-pay

## 2022-08-22 ENCOUNTER — Observation Stay (HOSPITAL_COMMUNITY)
Admission: RE | Admit: 2022-08-22 | Discharge: 2022-08-23 | Disposition: A | Discharge status: Home / Self Care | Payer: Medicare Other | Source: Ambulatory Visit | Attending: Orthopedic Surgery | Admitting: Orthopedic Surgery

## 2022-08-22 ENCOUNTER — Ambulatory Visit (HOSPITAL_COMMUNITY): Payer: Medicare Other | Admitting: Anesthesiology

## 2022-08-22 ENCOUNTER — Encounter (HOSPITAL_COMMUNITY)
Admission: RE | Disposition: A | Discharge status: Home / Self Care | Payer: Self-pay | Source: Ambulatory Visit | Attending: Orthopedic Surgery

## 2022-08-22 DIAGNOSIS — Z79899 Other long term (current) drug therapy: Secondary | ICD-10-CM | POA: Insufficient documentation

## 2022-08-22 DIAGNOSIS — Z87891 Personal history of nicotine dependence: Secondary | ICD-10-CM | POA: Insufficient documentation

## 2022-08-22 DIAGNOSIS — Z7982 Long term (current) use of aspirin: Secondary | ICD-10-CM | POA: Insufficient documentation

## 2022-08-22 DIAGNOSIS — M1711 Unilateral primary osteoarthritis, right knee: Secondary | ICD-10-CM | POA: Diagnosis not present

## 2022-08-22 DIAGNOSIS — Z85828 Personal history of other malignant neoplasm of skin: Secondary | ICD-10-CM | POA: Diagnosis not present

## 2022-08-22 DIAGNOSIS — S71101A Unspecified open wound, right thigh, initial encounter: Secondary | ICD-10-CM

## 2022-08-22 DIAGNOSIS — I10 Essential (primary) hypertension: Secondary | ICD-10-CM

## 2022-08-22 DIAGNOSIS — Z96651 Presence of right artificial knee joint: Secondary | ICD-10-CM

## 2022-08-22 DIAGNOSIS — E785 Hyperlipidemia, unspecified: Secondary | ICD-10-CM

## 2022-08-22 DIAGNOSIS — E039 Hypothyroidism, unspecified: Secondary | ICD-10-CM | POA: Insufficient documentation

## 2022-08-22 HISTORY — PX: TOTAL KNEE ARTHROPLASTY: SHX125

## 2022-08-22 SURGERY — ARTHROPLASTY, KNEE, TOTAL
Anesthesia: Regional | Site: Knee | Laterality: Right

## 2022-08-22 MED ORDER — SODIUM CHLORIDE (PF) 0.9 % IJ SOLN
INTRAMUSCULAR | Status: AC
  Filled 2022-08-22: qty 50

## 2022-08-22 MED ORDER — POVIDONE-IODINE 10 % EX SWAB: 2.0000 | Freq: Once | CUTANEOUS | Status: DC

## 2022-08-22 MED ORDER — METHOCARBAMOL 500 MG IVPB - SIMPLE MED: 500.0000 mg | Freq: Four times a day (QID) | INTRAVENOUS | Status: DC | PRN

## 2022-08-22 MED ORDER — HYDROMORPHONE HCL 1 MG/ML IJ SOLN: 0.5000 mg | INTRAMUSCULAR | Status: DC | PRN

## 2022-08-22 MED ORDER — 0.9 % SODIUM CHLORIDE (POUR BTL) OPTIME
TOPICAL | Status: DC | PRN
  Administered 2022-08-22: 1000 mL

## 2022-08-22 MED ORDER — LACTATED RINGERS IV SOLN: INTRAVENOUS | Status: DC

## 2022-08-22 MED ORDER — LACTATED RINGERS IV SOLN: INTRAVENOUS | Status: DC | PRN

## 2022-08-22 MED ORDER — METHOCARBAMOL 500 MG PO TABS: 500.0000 mg | ORAL_TABLET | Freq: Four times a day (QID) | ORAL | Status: DC | PRN

## 2022-08-22 MED ORDER — BISACODYL 10 MG RE SUPP: 10.0000 mg | Freq: Every day | RECTAL | Status: DC | PRN

## 2022-08-22 MED ORDER — FENTANYL CITRATE PF 50 MCG/ML IJ SOSY
50.0000 ug | PREFILLED_SYRINGE | INTRAMUSCULAR | Status: DC
  Administered 2022-08-22: 50 ug via INTRAVENOUS
  Filled 2022-08-22: qty 2

## 2022-08-22 MED ORDER — DIPHENHYDRAMINE HCL 12.5 MG/5ML PO ELIX: 12.5000 mg | ORAL_SOLUTION | ORAL | Status: DC | PRN

## 2022-08-22 MED ORDER — METOCLOPRAMIDE HCL 5 MG/ML IJ SOLN: 5.0000 mg | Freq: Three times a day (TID) | INTRAMUSCULAR | Status: DC | PRN

## 2022-08-22 MED ORDER — BUPIVACAINE-EPINEPHRINE 0.5% -1:200000 IJ SOLN
INTRAMUSCULAR | Status: DC | PRN
  Administered 2022-08-22: 30 mL

## 2022-08-22 MED ORDER — OXYCODONE HCL 5 MG PO TABS
5.0000 mg | ORAL_TABLET | ORAL | Status: DC | PRN
  Administered 2022-08-22 – 2022-08-23 (×2): 5 mg via ORAL
  Filled 2022-08-22 (×2): qty 1

## 2022-08-22 MED ORDER — AMLODIPINE BESYLATE 5 MG PO TABS
5.0000 mg | ORAL_TABLET | Freq: Every day | ORAL | Status: DC
  Administered 2022-08-23: 5 mg via ORAL
  Filled 2022-08-22: qty 1

## 2022-08-22 MED ORDER — PROPOFOL 500 MG/50ML IV EMUL
INTRAVENOUS | Status: DC | PRN
  Administered 2022-08-22: 50 ug/kg/min via INTRAVENOUS

## 2022-08-22 MED ORDER — DICYCLOMINE HCL 10 MG PO CAPS
10.0000 mg | ORAL_CAPSULE | Freq: Two times a day (BID) | ORAL | Status: DC
  Administered 2022-08-22 – 2022-08-23 (×2): 10 mg via ORAL
  Filled 2022-08-22 (×3): qty 1

## 2022-08-22 MED ORDER — TRANEXAMIC ACID-NACL 1000-0.7 MG/100ML-% IV SOLN
1000.0000 mg | INTRAVENOUS | Status: AC
  Administered 2022-08-22: 1000 mg via INTRAVENOUS
  Filled 2022-08-22: qty 100

## 2022-08-22 MED ORDER — EPHEDRINE SULFATE (PRESSORS) 50 MG/ML IJ SOLN
INTRAMUSCULAR | Status: DC | PRN
  Administered 2022-08-22: 20 mg via INTRAVENOUS
  Administered 2022-08-22 (×2): 10 mg via INTRAVENOUS

## 2022-08-22 MED ORDER — PHENYLEPHRINE HCL-NACL 20-0.9 MG/250ML-% IV SOLN
INTRAVENOUS | Status: DC | PRN
  Administered 2022-08-22: 25 ug/min via INTRAVENOUS

## 2022-08-22 MED ORDER — STERILE WATER FOR IRRIGATION IR SOLN
Status: DC | PRN
  Administered 2022-08-22: 2000 mL

## 2022-08-22 MED ORDER — ONDANSETRON HCL 4 MG/2ML IJ SOLN: 4.0000 mg | Freq: Four times a day (QID) | INTRAMUSCULAR | Status: DC | PRN

## 2022-08-22 MED ORDER — ONDANSETRON HCL 4 MG/2ML IJ SOLN
INTRAMUSCULAR | Status: DC | PRN
  Administered 2022-08-22: 4 mg via INTRAVENOUS

## 2022-08-22 MED ORDER — BRIMONIDINE TARTRATE 0.2 % OP SOLN
1.0000 [drp] | Freq: Two times a day (BID) | OPHTHALMIC | Status: DC
  Administered 2022-08-22 – 2022-08-23 (×2): 1 [drp] via OPHTHALMIC
  Filled 2022-08-22: qty 5

## 2022-08-22 MED ORDER — PHENOL 1.4 % MT LIQD: 1.0000 | OROMUCOSAL | Status: DC | PRN

## 2022-08-22 MED ORDER — ASPIRIN 81 MG PO CHEW
81.0000 mg | CHEWABLE_TABLET | Freq: Two times a day (BID) | ORAL | Status: DC
  Administered 2022-08-22 – 2022-08-23 (×2): 81 mg via ORAL
  Filled 2022-08-22 (×2): qty 1

## 2022-08-22 MED ORDER — ROPIVACAINE HCL 5 MG/ML IJ SOLN
INTRAMUSCULAR | Status: DC | PRN
  Administered 2022-08-22: 30 mL via PERINEURAL

## 2022-08-22 MED ORDER — SODIUM CHLORIDE (PF) 0.9 % IJ SOLN
INTRAMUSCULAR | Status: DC | PRN
  Administered 2022-08-22: 30 mL via INTRAVENOUS

## 2022-08-22 MED ORDER — ORAL CARE MOUTH RINSE: 15.0000 mL | Freq: Once | OROMUCOSAL | Status: AC

## 2022-08-22 MED ORDER — CHLORHEXIDINE GLUCONATE 0.12 % MT SOLN
15.0000 mL | Freq: Once | OROMUCOSAL | Status: AC
  Administered 2022-08-22: 15 mL via OROMUCOSAL

## 2022-08-22 MED ORDER — ONDANSETRON HCL 4 MG PO TABS: 4.0000 mg | ORAL_TABLET | Freq: Four times a day (QID) | ORAL | Status: DC | PRN

## 2022-08-22 MED ORDER — DEXAMETHASONE SODIUM PHOSPHATE 10 MG/ML IJ SOLN
8.0000 mg | Freq: Once | INTRAMUSCULAR | Status: AC
  Administered 2022-08-22: 8 mg via INTRAVENOUS

## 2022-08-22 MED ORDER — LOSARTAN POTASSIUM 50 MG PO TABS
50.0000 mg | ORAL_TABLET | Freq: Every day | ORAL | Status: DC
  Administered 2022-08-23: 50 mg via ORAL
  Filled 2022-08-22: qty 1

## 2022-08-22 MED ORDER — TAMSULOSIN HCL 0.4 MG PO CAPS
0.4000 mg | ORAL_CAPSULE | Freq: Every day | ORAL | Status: DC
  Administered 2022-08-22 – 2022-08-23 (×2): 0.4 mg via ORAL
  Filled 2022-08-22 (×2): qty 1

## 2022-08-22 MED ORDER — ONDANSETRON HCL 4 MG/2ML IJ SOLN
INTRAMUSCULAR | Status: AC
  Filled 2022-08-22: qty 2

## 2022-08-22 MED ORDER — LEVOTHYROXINE SODIUM 75 MCG PO TABS
75.0000 ug | ORAL_TABLET | Freq: Every day | ORAL | Status: DC
  Administered 2022-08-23: 75 ug via ORAL
  Filled 2022-08-22: qty 1

## 2022-08-22 MED ORDER — DEXAMETHASONE SODIUM PHOSPHATE 10 MG/ML IJ SOLN
10.0000 mg | Freq: Once | INTRAMUSCULAR | Status: AC
  Administered 2022-08-23: 10 mg via INTRAVENOUS
  Filled 2022-08-22: qty 1

## 2022-08-22 MED ORDER — TIMOLOL MALEATE 0.5 % OP SOLN
1.0000 [drp] | Freq: Two times a day (BID) | OPHTHALMIC | Status: DC
  Administered 2022-08-22 – 2022-08-23 (×2): 1 [drp] via OPHTHALMIC
  Filled 2022-08-22: qty 5

## 2022-08-22 MED ORDER — DEXAMETHASONE SODIUM PHOSPHATE 10 MG/ML IJ SOLN
INTRAMUSCULAR | Status: AC
  Filled 2022-08-22: qty 1

## 2022-08-22 MED ORDER — OXYCODONE HCL 5 MG PO TABS: 10.0000 mg | ORAL_TABLET | ORAL | Status: DC | PRN

## 2022-08-22 MED ORDER — TRANEXAMIC ACID-NACL 1000-0.7 MG/100ML-% IV SOLN
1000.0000 mg | Freq: Once | INTRAVENOUS | Status: AC
  Administered 2022-08-22: 1000 mg via INTRAVENOUS
  Filled 2022-08-22: qty 100

## 2022-08-22 MED ORDER — ACETAMINOPHEN 325 MG PO TABS: 325.0000 mg | ORAL_TABLET | Freq: Four times a day (QID) | ORAL | Status: DC | PRN

## 2022-08-22 MED ORDER — CEFAZOLIN SODIUM-DEXTROSE 2-4 GM/100ML-% IV SOLN
2.0000 g | Freq: Four times a day (QID) | INTRAVENOUS | Status: AC
  Administered 2022-08-22 (×2): 2 g via INTRAVENOUS
  Filled 2022-08-22 (×2): qty 100

## 2022-08-22 MED ORDER — MENTHOL 3 MG MT LOZG: 1.0000 | LOZENGE | OROMUCOSAL | Status: DC | PRN

## 2022-08-22 MED ORDER — LATANOPROST 0.005 % OP SOLN
1.0000 [drp] | Freq: Every day | OPHTHALMIC | Status: DC
  Administered 2022-08-22: 1 [drp] via OPHTHALMIC
  Filled 2022-08-22: qty 2.5

## 2022-08-22 MED ORDER — DOCUSATE SODIUM 100 MG PO CAPS
100.0000 mg | ORAL_CAPSULE | Freq: Two times a day (BID) | ORAL | Status: DC
  Administered 2022-08-22 – 2022-08-23 (×2): 100 mg via ORAL
  Filled 2022-08-22 (×3): qty 1

## 2022-08-22 MED ORDER — FENTANYL CITRATE PF 50 MCG/ML IJ SOSY: 25.0000 ug | PREFILLED_SYRINGE | INTRAMUSCULAR | Status: DC | PRN

## 2022-08-22 MED ORDER — PANTOPRAZOLE SODIUM 40 MG PO TBEC
40.0000 mg | DELAYED_RELEASE_TABLET | Freq: Every day | ORAL | Status: DC
  Administered 2022-08-22 – 2022-08-23 (×2): 40 mg via ORAL
  Filled 2022-08-22 (×2): qty 1

## 2022-08-22 MED ORDER — SODIUM CHLORIDE 0.9 % IR SOLN
Status: DC | PRN
  Administered 2022-08-22: 1000 mL

## 2022-08-22 MED ORDER — SODIUM CHLORIDE 0.9 % IV SOLN: INTRAVENOUS | Status: DC

## 2022-08-22 MED ORDER — CEFAZOLIN SODIUM-DEXTROSE 2-4 GM/100ML-% IV SOLN
2.0000 g | INTRAVENOUS | Status: AC
  Administered 2022-08-22: 2 g via INTRAVENOUS
  Filled 2022-08-22: qty 100

## 2022-08-22 MED ORDER — BUPROPION HCL ER (XL) 150 MG PO TB24
150.0000 mg | ORAL_TABLET | Freq: Every day | ORAL | Status: DC
  Administered 2022-08-22 – 2022-08-23 (×2): 150 mg via ORAL
  Filled 2022-08-22 (×2): qty 1

## 2022-08-22 MED ORDER — KETOROLAC TROMETHAMINE 30 MG/ML IJ SOLN
INTRAMUSCULAR | Status: AC
  Filled 2022-08-22: qty 1

## 2022-08-22 MED ORDER — BUPIVACAINE IN DEXTROSE 0.75-8.25 % IT SOLN
INTRATHECAL | Status: DC | PRN
  Administered 2022-08-22: 1.6 mL via INTRATHECAL

## 2022-08-22 MED ORDER — METOCLOPRAMIDE HCL 5 MG PO TABS: 5.0000 mg | ORAL_TABLET | Freq: Three times a day (TID) | ORAL | Status: DC | PRN

## 2022-08-22 MED ORDER — BRIMONIDINE TARTRATE-TIMOLOL 0.2-0.5 % OP SOLN
1.0000 [drp] | Freq: Two times a day (BID) | OPHTHALMIC | Status: DC
  Filled 2022-08-22: qty 5

## 2022-08-22 MED ORDER — ACETAMINOPHEN 500 MG PO TABS
1000.0000 mg | ORAL_TABLET | Freq: Four times a day (QID) | ORAL | Status: DC
  Administered 2022-08-22 – 2022-08-23 (×4): 1000 mg via ORAL
  Filled 2022-08-22 (×4): qty 2

## 2022-08-22 MED ORDER — KETOROLAC TROMETHAMINE 30 MG/ML IJ SOLN
INTRAMUSCULAR | Status: DC | PRN
  Administered 2022-08-22: 30 mg via INTRA_ARTICULAR

## 2022-08-22 MED ORDER — BUPIVACAINE-EPINEPHRINE (PF) 0.5% -1:200000 IJ SOLN
INTRAMUSCULAR | Status: AC
  Filled 2022-08-22: qty 30

## 2022-08-22 MED ORDER — ONDANSETRON HCL 4 MG/2ML IJ SOLN: 4.0000 mg | Freq: Once | INTRAMUSCULAR | Status: DC | PRN

## 2022-08-22 MED ORDER — AMISULPRIDE (ANTIEMETIC) 5 MG/2ML IV SOLN: 10.0000 mg | Freq: Once | INTRAVENOUS | Status: DC | PRN

## 2022-08-22 MED ORDER — ACETAMINOPHEN 500 MG PO TABS
1000.0000 mg | ORAL_TABLET | Freq: Once | ORAL | Status: AC
  Administered 2022-08-22: 500 mg via ORAL
  Filled 2022-08-22: qty 2

## 2022-08-22 MED ORDER — POLYETHYLENE GLYCOL 3350 17 G PO PACK
17.0000 g | PACK | Freq: Two times a day (BID) | ORAL | Status: DC
  Administered 2022-08-22: 17 g via ORAL
  Filled 2022-08-22 (×3): qty 1

## 2022-08-22 MED ORDER — BUSPIRONE HCL 5 MG PO TABS
7.5000 mg | ORAL_TABLET | Freq: Two times a day (BID) | ORAL | Status: DC
  Administered 2022-08-22 – 2022-08-23 (×2): 7.5 mg via ORAL
  Filled 2022-08-22 (×2): qty 2

## 2022-08-22 MED ORDER — SERTRALINE HCL 100 MG PO TABS
100.0000 mg | ORAL_TABLET | Freq: Every day | ORAL | Status: DC
  Administered 2022-08-22: 100 mg via ORAL
  Filled 2022-08-22: qty 1

## 2022-08-22 SURGICAL SUPPLY — 62 items
ADH SKN CLS APL DERMABOND .7 (GAUZE/BANDAGES/DRESSINGS) ×1
ATTUNE MED ANAT PAT 41 KNEE (Knees) IMPLANT
BAG COUNTER SPONGE SURGICOUNT (BAG) IMPLANT
BAG SPEC THK2 15X12 ZIP CLS (MISCELLANEOUS)
BAG SPNG CNTER NS LX DISP (BAG)
BAG ZIPLOCK 12X15 (MISCELLANEOUS) IMPLANT
BASE TIBIAL CEM ATTUNE SZ 7 (Knees) ×1 IMPLANT
BASEPLATE TIB CEM ATTUNE SZ7 (Knees) IMPLANT
BLADE SAW SGTL 11.0X1.19X90.0M (BLADE) IMPLANT
BLADE SAW SGTL 13.0X1.19X90.0M (BLADE) ×1 IMPLANT
BNDG CMPR 5X62 HK CLSR LF (GAUZE/BANDAGES/DRESSINGS) ×1
BNDG CMPR 6"X 5 YARDS HK CLSR (GAUZE/BANDAGES/DRESSINGS) ×1
BNDG CMPR MED 15X6 ELC VLCR LF (GAUZE/BANDAGES/DRESSINGS) ×1
BNDG ELASTIC 6INX 5YD STR LF (GAUZE/BANDAGES/DRESSINGS) ×1 IMPLANT
BNDG ELASTIC 6X15 VLCR STRL LF (GAUZE/BANDAGES/DRESSINGS) IMPLANT
BOWL SMART MIX CTS (DISPOSABLE) ×1 IMPLANT
BSPLAT TIB 7 CMNT FX BRNG STRL (Knees) ×1 IMPLANT
CEMENT HV SMART SET (Cement) IMPLANT
COMP FEM CMT ATTUNE RT 6 (Joint) ×1 IMPLANT
COMPONENT FEM CMT ATTUNE RT 6 (Joint) IMPLANT
COOLER ICEMAN CLASSIC (MISCELLANEOUS) IMPLANT
COVER SURGICAL LIGHT HANDLE (MISCELLANEOUS) ×1 IMPLANT
CUFF TOURN SGL QUICK 34 (TOURNIQUET CUFF) ×1
CUFF TRNQT CYL 34X4.125X (TOURNIQUET CUFF) ×1 IMPLANT
DERMABOND ADVANCED .7 DNX12 (GAUZE/BANDAGES/DRESSINGS) ×1 IMPLANT
DRAPE U-SHAPE 47X51 STRL (DRAPES) ×1 IMPLANT
DRESSING AQUACEL AG SP 3.5X10 (GAUZE/BANDAGES/DRESSINGS) ×1 IMPLANT
DRSG AQUACEL AG ADV 3.5X14 (GAUZE/BANDAGES/DRESSINGS) IMPLANT
DRSG AQUACEL AG SP 3.5X10 (GAUZE/BANDAGES/DRESSINGS) ×1
DURAPREP 26ML APPLICATOR (WOUND CARE) ×2 IMPLANT
ELECT REM PT RETURN 15FT ADLT (MISCELLANEOUS) ×1 IMPLANT
GLOVE BIO SURGEON STRL SZ 6 (GLOVE) ×1 IMPLANT
GLOVE BIOGEL PI IND STRL 6.5 (GLOVE) ×1 IMPLANT
GLOVE BIOGEL PI IND STRL 7.5 (GLOVE) ×1 IMPLANT
GLOVE ORTHO TXT STRL SZ7.5 (GLOVE) ×2 IMPLANT
GOWN STRL REUS W/ TWL LRG LVL3 (GOWN DISPOSABLE) ×2 IMPLANT
GOWN STRL REUS W/TWL LRG LVL3 (GOWN DISPOSABLE) ×2
HANDPIECE INTERPULSE COAX TIP (DISPOSABLE) ×1
HOLDER FOLEY CATH W/STRAP (MISCELLANEOUS) IMPLANT
INSERT TIB MED ATTUNE 6 8 RT (Insert) IMPLANT
KIT TURNOVER KIT A (KITS) IMPLANT
MANIFOLD NEPTUNE II (INSTRUMENTS) ×1 IMPLANT
NDL SAFETY ECLIP 18X1.5 (MISCELLANEOUS) IMPLANT
NS IRRIG 1000ML POUR BTL (IV SOLUTION) ×1 IMPLANT
PACK TOTAL KNEE CUSTOM (KITS) ×1 IMPLANT
PAD COLD SHLDR UNI XL WRAP-ON (PAD) ×1
PAD COLD UNI XL WRAP-ON (PAD) IMPLANT
PIN FIX SIGMA LCS THRD HI (PIN) IMPLANT
PROTECTOR NERVE ULNAR (MISCELLANEOUS) ×1 IMPLANT
SET HNDPC FAN SPRY TIP SCT (DISPOSABLE) ×1 IMPLANT
SET PAD KNEE POSITIONER (MISCELLANEOUS) ×1 IMPLANT
SPIKE FLUID TRANSFER (MISCELLANEOUS) ×2 IMPLANT
SUT MNCRL AB 4-0 PS2 18 (SUTURE) ×1 IMPLANT
SUT STRATAFIX PDS+ 0 24IN (SUTURE) ×1 IMPLANT
SUT VIC AB 1 CT1 36 (SUTURE) ×1 IMPLANT
SUT VIC AB 2-0 CT1 27 (SUTURE) ×3
SUT VIC AB 2-0 CT1 TAPERPNT 27 (SUTURE) ×2 IMPLANT
SYR 3ML LL SCALE MARK (SYRINGE) ×1 IMPLANT
TOWEL GREEN STERILE FF (TOWEL DISPOSABLE) ×1 IMPLANT
TRAY FOLEY MTR SLVR 16FR STAT (SET/KITS/TRAYS/PACK) ×1 IMPLANT
TUBE SUCTION HIGH CAP CLEAR NV (SUCTIONS) ×1 IMPLANT
WATER STERILE IRR 1000ML POUR (IV SOLUTION) ×2 IMPLANT

## 2022-08-22 NOTE — Transfer of Care (Signed)
Immediate Anesthesia Transfer of Care Note  Patient: Marvin Jones.  Procedure(s) Performed: TOTAL KNEE ARTHROPLASTY, EXCISION SKIN WOUND (Right: Knee)  Patient Location: PACU  Anesthesia Type:MAC combined with regional for post-op pain  Level of Consciousness: awake and alert   Airway & Oxygen Therapy: Patient Spontanous Breathing and Patient connected to nasal cannula oxygen  Post-op Assessment: Report given to RN and Post -op Vital signs reviewed and stable  Post vital signs: Reviewed and stable  Last Vitals:  Vitals Value Taken Time  BP 149/74 08/22/22 1148  Temp    Pulse 55 08/22/22 1150  Resp 14 08/22/22 1150  SpO2 99 % 08/22/22 1150  Vitals shown include unvalidated device data.  Last Pain:  Vitals:   08/22/22 0826  TempSrc:   PainSc: 0-No pain      Patients Stated Pain Goal: 4 (08/22/22 0826)  Complications: No notable events documented.

## 2022-08-22 NOTE — Anesthesia Procedure Notes (Signed)
Spinal  Patient location during procedure: OR Start time: 08/22/2022 10:00 AM End time: 08/22/2022 10:05 AM Reason for block: surgical anesthesia Staffing Performed: anesthesiologist  Anesthesiologist: Leonides Grills, MD Performed by: Leonides Grills, MD Authorized by: Leonides Grills, MD   Preanesthetic Checklist Completed: patient identified, IV checked, risks and benefits discussed, surgical consent, monitors and equipment checked, pre-op evaluation and timeout performed Spinal Block Patient position: sitting Prep: DuraPrep Patient monitoring: cardiac monitor, continuous pulse ox and blood pressure Approach: midline Location: L4-5 Injection technique: single-shot Needle Needle type: Pencan  Needle gauge: 24 G Needle length: 9 cm Assessment Sensory level: T10 Events: CSF return Additional Notes Functioning IV was confirmed and monitors were applied. Sterile prep and drape, including hand hygiene and sterile gloves were used. The patient was positioned and the spine was prepped. The skin was anesthetized with lidocaine.  Free flow of clear CSF was obtained prior to injecting local anesthetic into the CSF.  The spinal needle aspirated freely following injection.  The needle was carefully withdrawn.  The patient tolerated the procedure well.

## 2022-08-22 NOTE — Evaluation (Signed)
Physical Therapy Evaluation Patient Details Name: Marvin Jones. MRN: [id number] DOB: 1936-01-11 Today's Date: 08/22/2022  History of Present Illness  87 yo male presents to therapy s/p R TKA on 08/22/2022 due to failure of conservative measures. Pt PMH includes but is not limited to: PVC, DOE, HTN, toe avulsion, IBS, esophageal stricture, diverticulosis, L foot drop and HDL.  Clinical Impression    Marvin Jones. is a 87 y.o. male POD 0 s/p R TKA. Patient reports IND with mobility at baseline. Patient is now limited by functional impairments (see PT problem list below) and requires S for bed mobility and min guard and cues for transfers. Patient was able to ambulate 35 feet with RW and min guard level of assist. Patient instructed in exercise to facilitate ROM and circulation to manage edema.  Patient will benefit from continued skilled PT interventions to address impairments and progress towards PLOF. Acute PT will follow to progress mobility and stair training in preparation for safe discharge home with family support and OPPT services.       Assistance Recommended at Discharge Intermittent Supervision/Assistance  If plan is discharge home, recommend the following:  Can travel by private vehicle  A little help with walking and/or transfers;A little help with bathing/dressing/bathroom;Assistance with cooking/housework;Assist for transportation;Help with stairs or ramp for entrance        Equipment Recommendations Rolling walker (2 wheels)  Recommendations for Other Services       Functional Status Assessment Patient has had a recent decline in their functional status and demonstrates the ability to make significant improvements in function in a reasonable and predictable amount of time.     Precautions / Restrictions Precautions Precautions: Knee;Fall (L foot drop) Restrictions Weight Bearing Restrictions: No      Mobility  Bed Mobility Overal bed mobility: Needs  Assistance Bed Mobility: Supine to Sit     Supine to sit: Supervision, HOB elevated     General bed mobility comments: min cues    Transfers Overall transfer level: Needs assistance Equipment used: Rolling walker (2 wheels) Transfers: Sit to/from Stand Sit to Stand: Min guard, From elevated surface           General transfer comment: cues for safety, controlled movements and proper UE placement, pt indicated hx of dizziness with level changes and reported slight dizziness that quickly resolved    Ambulation/Gait Ambulation/Gait assistance: Min guard Gait Distance (Feet): 35 Feet Assistive device: Rolling walker (2 wheels) Gait Pattern/deviations: Step-to pattern, Antalgic Gait velocity: decreased     General Gait Details: heavy reliance on B UE for support, L hip and knee flexion to compensatory stratagy to clear L foot, minimal R foot clearance and limited stance time, recliner close  Stairs            Wheelchair Mobility     Tilt Bed    Modified Rankin (Stroke Patients Only)       Balance Overall balance assessment: Needs assistance, History of Falls (3 falls last 6 months) Sitting-balance support: Feet supported Sitting balance-Leahy Scale: Good     Standing balance support: Bilateral upper extremity supported, During functional activity, Reliant on assistive device for balance Standing balance-Leahy Scale: Poor                               Pertinent Vitals/Pain Pain Assessment Pain Assessment: 0-10 Pain Score: 2  Pain Location: R knee Pain Descriptors / Indicators: Aching, Constant,  Operative site guarding, Discomfort Pain Intervention(s): Limited activity within patient's tolerance, Monitored during session, Premedicated before session, Repositioned, Ice applied (pt requested tylenol only prior to PT eval)    Home Living Family/patient expects to be discharged to:: Private residence Living Arrangements: Alone Available Help at  Discharge: Family Type of Home: House Home Access: Stairs to enter Entrance Stairs-Rails: None Entrance Stairs-Number of Steps: 1   Home Layout: Two level;Able to live on main level with bedroom/bathroom Home Equipment: Rollator (4 wheels);Grab bars - tub/shower;Cane - single point Additional Comments: iceman    Prior Function Prior Level of Function : Independent/Modified Independent             Mobility Comments: Mod I with SPC for all ADLs, self care tasks, IADLs       Hand Dominance        Extremity/Trunk Assessment        Lower Extremity Assessment Lower Extremity Assessment: RLE deficits/detail;LLE deficits/detail RLE Deficits / Details: ankle DF 4/5, PF 4-/5; SLR < 10 degree lag RLE Sensation: WNL LLE Deficits / Details: absent L DF, PF 4-/5 LLE Sensation: WNL    Cervical / Trunk Assessment Cervical / Trunk Assessment:  (slight head forward)  Communication   Communication: HOH (increased volume)  Cognition Arousal/Alertness: Awake/alert Behavior During Therapy: WFL for tasks assessed/performed Overall Cognitive Status: Within Functional Limits for tasks assessed                                          General Comments      Exercises Total Joint Exercises Ankle Circles/Pumps: AROM, Both, 20 reps Straight Leg Raises: AROM, Right, 5 reps   Assessment/Plan    PT Assessment Patient needs continued PT services  PT Problem List Decreased strength;Decreased range of motion;Decreased activity tolerance;Decreased balance;Decreased mobility;Decreased coordination;Pain       PT Treatment Interventions DME instruction;Gait training;Functional mobility training;Stair training;Therapeutic activities;Therapeutic exercise;Balance training;Neuromuscular re-education;Patient/family education;Modalities    PT Goals (Current goals can be found in the Care Plan section)  Acute Rehab PT Goals Patient Stated Goal: to be able to do a walking program  and return to swiming laps PT Goal Formulation: With patient Time For Goal Achievement: 09/05/22 Potential to Achieve Goals: Good    Frequency 7X/week     Co-evaluation               AM-PAC PT "6 Clicks" Mobility  Outcome Measure Help needed turning from your back to your side while in a flat bed without using bedrails?: None Help needed moving from lying on your back to sitting on the side of a flat bed without using bedrails?: None Help needed moving to and from a bed to a chair (including a wheelchair)?: A Little Help needed standing up from a chair using your arms (e.g., wheelchair or bedside chair)?: A Little Help needed to walk in hospital room?: A Little Help needed climbing 3-5 steps with a railing? : A Lot 6 Click Score: 19    End of Session Equipment Utilized During Treatment: Gait belt Activity Tolerance: Patient tolerated treatment well;No increased pain Patient left: in chair;with call bell/phone within reach;with chair alarm set;with family/visitor present Nurse Communication: Mobility status PT Visit Diagnosis: Unsteadiness on feet (R26.81);Other abnormalities of gait and mobility (R26.89);Muscle weakness (generalized) (M62.81);Difficulty in walking, not elsewhere classified (R26.2);Pain;History of falling (Z91.81) Pain - Right/Left: Right Pain - part of body: Knee  Time: 1610-9604 PT Time Calculation (min) (ACUTE ONLY): 32 min   Charges:   PT Evaluation $PT Eval Low Complexity: 1 Low PT Treatments $Gait Training: 8-22 mins PT General Charges $$ ACUTE PT VISIT: 1 Visit         Johnny Bridge, PT Acute Rehab   Jacqualyn Posey 08/22/2022, 5:29 PM

## 2022-08-22 NOTE — Anesthesia Procedure Notes (Signed)
Anesthesia Regional Block: Adductor canal block   Pre-Anesthetic Checklist: , timeout performed,  Correct Patient, Correct Site, Correct Laterality,  Correct Procedure,, site marked,  Risks and benefits discussed,  Surgical consent,  Pre-op evaluation,  At surgeon's request and post-op pain management  Laterality: Right  Prep: chloraprep       Needles:  Injection technique: Single-shot  Needle Type: Echogenic Stimulator Needle     Needle Length: 10cm  Needle Gauge: 20     Additional Needles:   Procedures:,,,, ultrasound used (permanent image in chart),,    Narrative:  Start time: 08/22/2022 9:15 AM End time: 08/22/2022 9:25 AM Injection made incrementally with aspirations every 5 mL.  Performed by: Personally  Anesthesiologist: Leonides Grills, MD  Additional Notes: Functioning IV was confirmed and monitors were applied. A time-out was performed. Hand hygiene and sterile gloves were used. The thigh was placed in a frog-leg position and prepped in a sterile fashion. A 20ga Bbraun echogenic stimulator needle was placed using ultrasound guidance.  Negative aspiration and negative test dose prior to incremental administration of local anesthetic. The patient tolerated the procedure well.

## 2022-08-22 NOTE — Anesthesia Preprocedure Evaluation (Addendum)
Anesthesia Evaluation  Patient identified by MRN, date of birth, ID band Patient awake    Reviewed: Allergy & Precautions, NPO status , Patient's Chart, lab work & pertinent test results  Airway Mallampati: III  TM Distance: >3 FB Neck ROM: Full    Dental no notable dental hx.    Pulmonary former smoker   Pulmonary exam normal        Cardiovascular hypertension, Pt. on medications Normal cardiovascular exam     Neuro/Psych  PSYCHIATRIC DISORDERS Anxiety Depression     Neuromuscular disease    GI/Hepatic Neg liver ROS,GERD  Medicated and Controlled,,  Endo/Other  Hypothyroidism    Renal/GU negative Renal ROS     Musculoskeletal  (+) Arthritis ,    Abdominal   Peds  Hematology negative hematology ROS (+)   Anesthesia Other Findings Right knee osteoarthritis  Reproductive/Obstetrics                             Anesthesia Physical Anesthesia Plan  ASA: 3  Anesthesia Plan: Spinal and Regional   Post-op Pain Management: Regional block*   Induction: Intravenous  PONV Risk Score and Plan: 1 and Ondansetron, Dexamethasone, Propofol infusion and Treatment may vary due to age or medical condition  Airway Management Planned: Simple Face Mask  Additional Equipment:   Intra-op Plan:   Post-operative Plan:   Informed Consent: I have reviewed the patients History and Physical, chart, labs and discussed the procedure including the risks, benefits and alternatives for the proposed anesthesia with the patient or authorized representative who has indicated his/her understanding and acceptance.     Dental advisory given  Plan Discussed with: CRNA  Anesthesia Plan Comments:        Anesthesia Quick Evaluation

## 2022-08-22 NOTE — Anesthesia Postprocedure Evaluation (Signed)
Anesthesia Post Note  Patient: Marvin Jones.  Procedure(s) Performed: TOTAL KNEE ARTHROPLASTY, EXCISION SKIN WOUND (Right: Knee)     Patient location during evaluation: PACU Anesthesia Type: Regional and Spinal Level of consciousness: awake Pain management: pain level controlled Vital Signs Assessment: post-procedure vital signs reviewed and stable Respiratory status: spontaneous breathing, nonlabored ventilation and respiratory function stable Cardiovascular status: blood pressure returned to baseline and stable Postop Assessment: no apparent nausea or vomiting Anesthetic complications: no   No notable events documented.  Last Vitals:  Vitals:   08/22/22 1300 08/22/22 1535  BP: (!) 154/84 (!) 137/98  Pulse: (!) 54 75  Resp: 18 18  Temp: (!) 36.4 C 36.6 C  SpO2: 95% 98%    Last Pain:  Vitals:   08/22/22 1535  TempSrc: Oral  PainSc: 0-No pain                 Tallis Soledad P Tyress Loden

## 2022-08-22 NOTE — Discharge Instructions (Signed)

## 2022-08-22 NOTE — Op Note (Signed)
NAME:  Marvin Jones.                      MEDICAL RECORD NO.:  [id number]                             FACILITY:  [facility]      PHYSICIAN:  Madlyn Frankel. Charlann Boxer, M.D.  DATE OF BIRTH:  12-Sep-1935      DATE OF PROCEDURE:  08/22/2022                                     OPERATIVE REPORT         PREOPERATIVE DIAGNOSIS: 1.  Right knee osteoarthritis.  2.  Right distal thigh nonhealing wound, rule out skin cancer     POSTOPERATIVE DIAGNOSIS: 1.  Right knee osteoarthritis. 2.  Right distal thigh nonhealing wound, rule out skin cancer     FINDINGS:  The patient was noted to have complete loss of cartilage and   bone-on-bone arthritis with associated osteophytes in the medial and patellofemoral compartments of   the knee with a significant synovitis and associated effusion.  The patient had failed months of conservative treatment including medications, injection therapy, activity modification.     PROCEDURE: 1.  Right total knee replacement.  2.  Excision of nonhealing right distal thigh wound, rule out skin cancer     COMPONENTS USED:  DePuy Attune FB CR MS knee   system, a size 6 femur, 7 tibia, size 7 mm CR MS AOX insert, and 41 anatomic patellar   button.      SURGEON:  Madlyn Frankel. Charlann Boxer, M.D.      ASSISTANT:  Rosalene Billings, PA-C.      ANESTHESIA:  Regional and Spinal.      SPECIMENS:  None.      COMPLICATION:  None.      DRAINS:  None.  EBL: <100 cc      TOURNIQUET TIME:  30 min at 225 mmHg     The patient was stable to the recovery room.      INDICATION FOR PROCEDURE:  Marvin Jones. is a 87 y.o. male patient of   mine.  The patient had been seen, evaluated, and treated for months conservatively in the   office with medication, activity modification, and injections.  The patient had   radiographic changes of bone-on-bone arthritis with endplate sclerosis and osteophytes noted.  Based on the radiographic changes and failed conservative measures, the patient   decided to  proceed with definitive treatment, total knee replacement.  Risks of infection, DVT, component failure, need for revision surgery, neurovascular injury were reviewed in the office setting.  The postop course was reviewed stressing the efforts to maximize post-operative satisfaction and function.  Consent was obtained for benefit of pain   relief.   Upon evaluation in the preoperative holding area he was noted to have a Band-Aid on the distal portion of his right thigh anteriorly.  It was in proximity to where a primary knee incision would be.  We reviewed that he had been seen by dermatologist and they performed an excision.  This area had no signs of infection but was nonhealing.  I discussed with him that I would likely extend my incision proximally and would excise this area and sent off to pathology as a  part of our routine incision however would evaluate and rule out squamous versus basal cell carcinoma.     PROCEDURE IN DETAIL:  The patient was brought to the operative theater.   Once adequate anesthesia, preoperative antibiotics, 2 gm of Ancef,1 gm of Tranexamic Acid, and 10 mg of Decadron administered, the patient was positioned supine with a right thigh tourniquet placed.  The  right lower extremity was prepped and draped in sterile fashion.  A time-   out was performed identifying the patient, planned procedure, and the appropriate extremity.      The right lower extremity was placed in the [facility] leg holder.  The leg was   exsanguinated, tourniquet elevated to 225 mmHg.  A midline incision was   Made.  During this midline incision I extended proximally and performed an elliptical excision of this nonhealing wound.  This portion of his skin was sent to pathology to rule out and evaluate for squamous versus basal cell.  These findings will be reviewed with the patient postoperatively and pass along to his dermatologist as needed.  Following the skin incision and soft tissue exposure the median  parapatellar arthrotomy was made.  Following initial   exposure, attention was first directed to the patella.  Precut   measurement was noted to be 24 mm.  I resected down to 14 mm and used a   41 anatomic patellar button to restore patellar height as well as cover the cut surface.      The lug holes were drilled and a metal shim was placed to protect the   patella from retractors and saw blade during the procedure.      At this point, attention was now directed to the femur.  The femoral   canal was opened with a drill, irrigated to try to prevent fat emboli.  An   intramedullary rod was passed at 5 degrees valgus, 9 mm of bone was   resected off the distal femur.  Following this resection, the tibia was   subluxated anteriorly.  Using the extramedullary guide, 2 mm of bone was resected off   the proximal medial tibia.  We confirmed the gap would be   stable medially and laterally with a size 6 spacer block as well as confirmed that the tibial cut was perpendicular in the coronal plane, checking with an alignment rod.      Once this was done, I sized the femur to be a size 6 in the anterior-   posterior dimension, chose a standard component based on medial and   lateral dimension.  The size 6 rotation block was then pinned in   position anterior referenced using the C-clamp to set rotation.  The   anterior, posterior, and  chamfer cuts were made without difficulty nor   notching making certain that I was along the anterior cortex to help   with flexion gap stability.      The final shim cut was made off the lateral aspect of distal femur.      At this point, the tibia was sized to be a size 7.  The size 7 tray was   then pinned in position through the medial third of the tubercle,   drilled, and keel punched.  Trial reduction was now carried with a 6 femur,  7 tibia, a size 7 mm CR MS insert, and the 41 anatomic patella botton.  The knee was brought to full extension with good flexion  stability with the  patella   tracking through the trochlea without application of pressure.  Given   all these findings the trial components removed.  Final components were   opened and cement was mixed.  The knee was irrigated with normal saline solution and pulse lavage.  The synovial lining was   then injected with 30 cc of 0.25% Marcaine with epinephrine, 1 cc of Toradol and 30 cc of NS for a total of 61 cc.     Final implants were then cemented onto cleaned and dried cut surfaces of bone with the knee brought to extension with a size 7 mm CR MS trial insert.      Once the cement had fully cured, excess cement was removed   throughout the knee.  I confirmed that I was satisfied with the range of   motion and stability, and the final size 7 mm CR MS AOX insert was chosen.  It was   placed into the knee.      The tourniquet had been let down at 30 minutes.  No significant   hemostasis was required.  The extensor mechanism was then reapproximated using #1 Vicryl and #1 Stratafix sutures with the knee   in flexion.  The   remaining wound was closed with 2-0 Vicryl and running 4-0 Monocryl.   The knee was cleaned, dried, dressed sterilely using Dermabond and   Aquacel dressing.  The patient was then   brought to recovery room in stable condition, tolerating the procedure   well.   Please note that Physician Assistant, Rosalene Billings, PA-C was present for the entirety of the case, and was utilized for pre-operative positioning, peri-operative retractor management, general facilitation of the procedure and for primary wound closure at the end of the case.              Madlyn Frankel Charlann Boxer, M.D.    08/22/2022 8:45 AM

## 2022-08-22 NOTE — Interval H&P Note (Signed)
History and Physical Interval Note:  08/22/2022 8:45 AM  Marvin Jones.  has presented today for surgery, with the diagnosis of Right knee osteoarthritis.  The various methods of treatment have been discussed with the patient and family. After consideration of risks, benefits and other options for treatment, the patient has consented to  Procedure(s): TOTAL KNEE ARTHROPLASTY (Right) as a surgical intervention.  The patient's history has been reviewed, patient examined, no change in status, stable for surgery.  I have reviewed the patient's chart and labs.  Questions were answered to the patient's satisfaction.     Shelda Pal

## 2022-08-23 ENCOUNTER — Other Ambulatory Visit: Payer: Self-pay | Admitting: Internal Medicine

## 2022-08-23 ENCOUNTER — Encounter (HOSPITAL_COMMUNITY): Payer: Self-pay | Admitting: Orthopedic Surgery

## 2022-08-23 DIAGNOSIS — M1711 Unilateral primary osteoarthritis, right knee: Secondary | ICD-10-CM | POA: Diagnosis not present

## 2022-08-23 LAB — CBC
HCT: 36.2 % — ABNORMAL LOW (ref 39.0–52.0)
Hemoglobin: 11.6 g/dL — ABNORMAL LOW (ref 13.0–17.0)
MCH: 30.6 pg (ref 26.0–34.0)
MCHC: 32 g/dL (ref 30.0–36.0)
MCV: 95.5 fL (ref 80.0–100.0)
Platelets: 260 10*3/uL (ref 150–400)
RBC: 3.79 MIL/uL — ABNORMAL LOW (ref 4.22–5.81)
RDW: 12.8 % (ref 11.5–15.5)
WBC: 21.5 10*3/uL — ABNORMAL HIGH (ref 4.0–10.5)
nRBC: 0 % (ref 0.0–0.2)

## 2022-08-23 LAB — BASIC METABOLIC PANEL
Anion gap: 9 (ref 5–15)
BUN: 30 mg/dL — ABNORMAL HIGH (ref 8–23)
CO2: 22 mmol/L (ref 22–32)
Calcium: 8.6 mg/dL — ABNORMAL LOW (ref 8.9–10.3)
Chloride: 104 mmol/L (ref 98–111)
Creatinine, Ser: 1.06 mg/dL (ref 0.61–1.24)
GFR, Estimated: >60 mL/min (ref >60)
Glucose, Bld: 140 mg/dL — ABNORMAL HIGH (ref 70–99)
Potassium: 4.3 mmol/L (ref 3.5–5.1)
Sodium: 135 mmol/L (ref 135–145)

## 2022-08-23 LAB — SURGICAL PATHOLOGY

## 2022-08-23 MED ORDER — POLYETHYLENE GLYCOL 3350 17 G PO PACK: 17.0000 g | PACK | Freq: Two times a day (BID) | ORAL | 0 refills | Status: AC

## 2022-08-23 MED ORDER — OXYCODONE HCL 5 MG PO TABS: 5.0000 mg | ORAL_TABLET | ORAL | 0 refills | Status: AC | PRN

## 2022-08-23 MED ORDER — METHOCARBAMOL 500 MG PO TABS: 500.0000 mg | ORAL_TABLET | Freq: Four times a day (QID) | ORAL | 2 refills | Status: AC | PRN

## 2022-08-23 MED ORDER — ALUM & MAG HYDROXIDE-SIMETH 200-200-20 MG/5ML PO SUSP
15.0000 mL | Freq: Four times a day (QID) | ORAL | Status: DC | PRN
  Administered 2022-08-23: 15 mL via ORAL
  Filled 2022-08-23: qty 30

## 2022-08-23 MED ORDER — ASPIRIN 81 MG PO CHEW: 81.0000 mg | CHEWABLE_TABLET | Freq: Two times a day (BID) | ORAL | 0 refills | Status: AC

## 2022-08-23 NOTE — Progress Notes (Signed)
Subjective: 1 Day Post-Op Procedure(s) (LRB): TOTAL KNEE ARTHROPLASTY, EXCISION SKIN WOUND (Right) Patient reports pain as mild.   Patient seen in rounds with Dr. Charlann Boxer. Patient is well, and has had no acute complaints or problems. No acute events overnight. Foley catheter removed. Patient ambulated 35 feet with PT.  We will start therapy today.   Objective: Vital signs in last 24 hours: Temp:  [97.3 F (36.3 C)-98 F (36.7 C)] 98 F (36.7 C) (07/03 0538) Pulse Rate:  [50-85] 73 (07/03 0538) Resp:  [13-21] 18 (07/03 0538) BP: (128-165)/(70-98) 148/82 (07/03 0538) SpO2:  [94 %-99 %] 98 % (07/03 0538) Weight:  [82.6 kg] 82.6 kg (07/02 0826)  Intake/Output from previous day:  Intake/Output Summary (Last 24 hours) at 08/23/2022 0809 Last data filed at 08/23/2022 0535 Gross per 24 hour  Intake 2802.45 ml  Output 2455 ml  Net 347.45 ml     Intake/Output this shift: No intake/output data recorded.  Labs: Recent Labs    08/23/22 0348  HGB 11.6*   Recent Labs    08/23/22 0348  WBC 21.5*  RBC 3.79*  HCT 36.2*  PLT 260   Recent Labs    08/23/22 0348  NA 135  K 4.3  CL 104  CO2 22  BUN 30*  CREATININE 1.06  GLUCOSE 140*  CALCIUM 8.6*   No results for input(s): "LABPT", "INR" in the last 72 hours.  Exam: General - Patient is Alert and Oriented Extremity - Neurologically intact Sensation intact distally Intact pulses distally Dorsiflexion/Plantar flexion intact Dressing - dressing C/D/I Motor Function - intact, moving foot and toes well on exam.   Past Medical History:  Diagnosis Date   Anxiety and depression    h/o   Arthritis    Barrett's esophagus    Diaphragmatic hernia without mention of obstruction or gangrene    Diverticulosis of colon (without mention of hemorrhage)    Esophageal reflux    HTN (hypertension)    Hyperlipidemia    Hypertrophy of prostate with urinary obstruction and other lower urinary tract symptoms (LUTS)    Hypothyroidism     Irritable bowel syndrome    Personal history of colonic polyps    Skin cancer    sees Dr Yetta Barre    Stricture and stenosis of esophagus     Assessment/Plan: 1 Day Post-Op Procedure(s) (LRB): TOTAL KNEE ARTHROPLASTY, EXCISION SKIN WOUND (Right) Principal Problem:   S/P total knee replacement, right Active Problems:   S/P total knee arthroplasty, right  Estimated body mass index is 26.88 kg/m as calculated from the following:   Height as of this encounter: 5\' 9"  (1.753 m).   Weight as of this encounter: 82.6 kg. Advance diet Up with therapy D/C IV fluids   Patient's anticipated LOS is less than 2 midnights, meeting these requirements: - Younger than 60 - Lives within 1 hour of care - Has a competent adult at home to recover with post-op recover - NO history of  - Chronic pain requiring opiods  - Diabetes  - Coronary Artery Disease  - Heart failure  - Heart attack  - Stroke  - DVT/VTE  - Cardiac arrhythmia  - Respiratory Failure/COPD  - Renal failure  - Anemia  - Advanced Liver disease     DVT Prophylaxis - Aspirin Weight bearing as tolerated.  Hgb stable at 11.6 this AM.  Plan is to go Home after hospital stay. Plan for discharge today following 1-2 sessions of PT as long as they are  meeting their goals. Patient is scheduled for OPPT. Follow up in the office in 2 weeks.   Rosalene Billings, PA-C Orthopedic Surgery [phone] 08/23/2022, 8:09 AM

## 2022-08-23 NOTE — TOC Transition Note (Signed)
Transition of Care [facility]) - CM/SW Discharge Note   Patient Details  Name: Marvin Jones. MRN: [id number] Date of Birth: 04/15/1935  Transition of Care (TOC) CM/SW Contact:  Catalina Gravel, LCSW Phone Number: 08/23/2022, 12:15 PM   Clinical Narrative:    CSW met with pt at bedside, step daughter also visiting. Pt confirmed OPPT with [facility].  Pt has a rollator walker at home, that he has had about 2 months.  Daughter felt there is another walker, not certain that it is the recommended walker. CSW shared info with CSW Valentina Gu, joined me in room- clarified with pt that he does not have a 2W walker that is recommended, and he and daughter agreeable to ordering one. CSW shared with [employer] provider pt requesting  a walker, and it will be delivered to room today.  No further TOC needs.       Barriers to Discharge: No Barriers Identified   Patient Goals and CMS Choice      Discharge Placement                         Discharge Plan and Services Additional resources added to the After Visit Summary for                                       Social Determinants of Health (SDOH) Interventions SDOH Screenings   Food Insecurity: No Food Insecurity (08/22/2022)  Housing: Low Risk  (08/22/2022)  Transportation Needs: No Transportation Needs (08/22/2022)  Utilities: Not At Risk (08/22/2022)  Tobacco Use: Medium Risk (08/22/2022)     Readmission Risk Interventions     No data to display

## 2022-08-23 NOTE — Progress Notes (Signed)
Patient and daughter were provided with discharge education, both verbalized understanding. IV removed.

## 2022-08-23 NOTE — Progress Notes (Signed)
Physical Therapy Treatment Patient Details Name: Marvin Jones. MRN: [id number] DOB: 08-Oct-1935 Today's Date: 08/23/2022   History of Present Illness 87 yo male presents to therapy s/p R TKA on 08/22/2022 due to failure of conservative measures. Pt PMH includes but is not limited to: PVC, DOE, HTN, toe avulsion, IBS, esophageal stricture, diverticulosis, L foot drop and HDL.    PT Comments  Pt is mobilizing well and is ready to DC home from a PT standpoint. He ambulated 120' with RW, completed stair training, and demonstrates good understanding of HEP. Pt's daughter [name] present for session.      Assistance Recommended at Discharge Intermittent Supervision/Assistance  If plan is discharge home, recommend the following:  Can travel by private vehicle    A little help with walking and/or transfers;A little help with bathing/dressing/bathroom;Assistance with cooking/housework;Assist for transportation;Help with stairs or ramp for entrance      Equipment Recommendations  Rolling walker (2 wheels)    Recommendations for Other Services       Precautions / Restrictions Precautions Precautions: Knee;Fall (L foot drop) Precaution Booklet Issued: Yes (comment) Precaution Comments: reviewed no pillow under knee Restrictions Weight Bearing Restrictions: No     Mobility  Bed Mobility Overal bed mobility: Needs Assistance Bed Mobility: Supine to Sit     Supine to sit: HOB elevated, Modified independent (Device/Increase time)          Transfers Overall transfer level: Needs assistance Equipment used: Rolling walker (2 wheels) Transfers: Sit to/from Stand Sit to Stand: Min guard, From elevated surface           General transfer comment: cues for proper UE placement, pt indicated hx of dizziness with level changes and reported slight dizziness that quickly resolved    Ambulation/Gait Ambulation/Gait assistance: Min guard Gait Distance (Feet): 120 Feet Assistive device:  Rolling walker (2 wheels) Gait Pattern/deviations: Step-to pattern, Antalgic Gait velocity: decreased     General Gait Details: noted L foot drop (which is baseline, pt doesn't have AFO here), VCs for proximity to RW   Stairs Stairs: Yes Stairs assistance: Min guard Stair Management: Step to pattern, Backwards, With walker, No rails Number of Stairs: 1 General stair comments: VCs sequencing, Daughter [name] present and she assisted with steadying RW   Wheelchair Mobility     Tilt Bed    Modified Rankin (Stroke Patients Only)       Balance Overall balance assessment: Needs assistance, History of Falls (3 falls last 6 months) Sitting-balance support: Feet supported Sitting balance-Leahy Scale: Good     Standing balance support: Bilateral upper extremity supported, During functional activity, Reliant on assistive device for balance Standing balance-Leahy Scale: Poor                              Cognition Arousal/Alertness: Awake/alert Behavior During Therapy: WFL for tasks assessed/performed Overall Cognitive Status: Within Functional Limits for tasks assessed                                          Exercises Total Joint Exercises Ankle Circles/Pumps: AROM, Both, 15 reps, Supine Quad Sets: AROM, Both, 5 reps, Supine Short Arc Quad: AROM, Right, 10 reps, Supine Heel Slides: AAROM, Right, 10 reps, Supine Hip ABduction/ADduction: AROM, Right, 10 reps, Supine Straight Leg Raises: AROM, Right, 5 reps Long Arc Quad: AROM, Right,  10 reps, Seated Knee Flexion: AAROM, Right, 10 reps, Seated Goniometric ROM: 5-50* AAROM R knee    General Comments        Pertinent Vitals/Pain Pain Assessment Pain Score: 5  Pain Location: R knee Pain Descriptors / Indicators: Operative site guarding, Sore Pain Intervention(s): Limited activity within patient's tolerance, Monitored during session, RN gave pain meds during session, Ice applied    Home  Living                          Prior Function            PT Goals (current goals can now be found in the care plan section) Acute Rehab PT Goals Patient Stated Goal: to be able to do a walking program and return to swiming laps PT Goal Formulation: With patient Time For Goal Achievement: 09/05/22 Potential to Achieve Goals: Good Progress towards PT goals: Progressing toward goals    Frequency    7X/week      PT Plan Current plan remains appropriate    Co-evaluation              AM-PAC PT "6 Clicks" Mobility   Outcome Measure  Help needed turning from your back to your side while in a flat bed without using bedrails?: None Help needed moving from lying on your back to sitting on the side of a flat bed without using bedrails?: None Help needed moving to and from a bed to a chair (including a wheelchair)?: A Little Help needed standing up from a chair using your arms (e.g., wheelchair or bedside chair)?: A Little Help needed to walk in hospital room?: A Little Help needed climbing 3-5 steps with a railing? : A Little 6 Click Score: 20    End of Session Equipment Utilized During Treatment: Gait belt Activity Tolerance: Patient tolerated treatment well Patient left: in chair;with call bell/phone within reach;with chair alarm set;with family/visitor present Nurse Communication: Mobility status PT Visit Diagnosis: Unsteadiness on feet (R26.81);Other abnormalities of gait and mobility (R26.89);Muscle weakness (generalized) (M62.81);Difficulty in walking, not elsewhere classified (R26.2);Pain;History of falling (Z91.81) Pain - Right/Left: Right Pain - part of body: Knee     Time: 4098-1191 PT Time Calculation (min) (ACUTE ONLY): 35 min  Charges:    $Gait Training: 8-22 mins $Therapeutic Exercise: 8-22 mins PT General Charges $$ ACUTE PT VISIT: 1 Visit                     Tamala Ser PT 08/23/2022  Acute Rehabilitation  Services  Office [phone]

## 2022-08-27 NOTE — Discharge Summary (Addendum)
Patient ID: Marvin Jones. MRN: [id number] DOB/AGE: 85-15-1937 85 y.o.  Admit date: 08/22/2022 Discharge date: 08/23/2022  Admission Diagnoses:  Right knee osteoarthritis 2. Right thigh skin lesion  Discharge Diagnoses:  Principal Problem:   S/P total knee replacement, right Active Problems:   S/P total knee arthroplasty, right   Past Medical History:  Diagnosis Date   Anxiety and depression    h/o   Arthritis    Barrett's esophagus    Diaphragmatic hernia without mention of obstruction or gangrene    Diverticulosis of colon (without mention of hemorrhage)    Esophageal reflux    HTN (hypertension)    Hyperlipidemia    Hypertrophy of prostate with urinary obstruction and other lower urinary tract symptoms (LUTS)    Hypothyroidism    Irritable bowel syndrome    Personal history of colonic polyps    Skin cancer    sees Dr Yetta Barre    Stricture and stenosis of esophagus     Surgeries: Procedure(s): TOTAL KNEE ARTHROPLASTY, EXCISION SKIN WOUND on 08/22/2022   Consultants:   Discharged Condition: Improved  Hospital Course: Marvin Jones. is an 87 y.o. male who was admitted 08/22/2022 for operative treatment ofS/P total knee replacement, right. Patient has severe unremitting pain that affects sleep, daily activities, and work/hobbies. After pre-op clearance the patient was taken to the operating room on 08/22/2022 and underwent  Procedure(s): TOTAL KNEE ARTHROPLASTY, EXCISION SKIN WOUND.    Patient was given perioperative antibiotics:  Anti-infectives (From admission, onward)    Start     Dose/Rate Route Frequency Ordered Stop   08/22/22 1600  ceFAZolin (ANCEF) IVPB 2g/100 mL premix        2 g 200 mL/hr over 30 Minutes Intravenous Every 6 hours 08/22/22 1302 08/22/22 2209   08/22/22 0800  ceFAZolin (ANCEF) IVPB 2g/100 mL premix        2 g 200 mL/hr over 30 Minutes Intravenous On call to O.R. 08/22/22 0748 08/22/22 1028        Patient was given sequential compression  devices, early ambulation, and chemoprophylaxis to prevent DVT. Patient worked with PT and was meeting their goals regarding safe ambulation and transfers.  Patient benefited maximally from hospital stay and there were no complications.    Recent vital signs: No data found.   Recent laboratory studies: No results for input(s): "WBC", "HGB", "HCT", "PLT", "NA", "K", "CL", "CO2", "BUN", "CREATININE", "GLUCOSE", "INR", "CALCIUM" in the last 72 hours.  Invalid input(s): "PT", "2"   Discharge Medications:   Allergies as of 08/23/2022   No Known Allergies      Medication List     STOP taking these medications    polyethylene glycol powder 17 GM/SCOOP powder Commonly known as: GLYCOLAX/MIRALAX Replaced by: polyethylene glycol 17 g packet       TAKE these medications    acetaminophen 500 MG tablet Commonly known as: TYLENOL Take 1-2 tablets (500-1,000 mg total) by mouth 2 (two) times daily.   amLODipine 5 MG tablet Commonly known as: NORVASC Take 1 tablet (5 mg total) by mouth daily.   aspirin 81 MG chewable tablet Chew 1 tablet (81 mg total) by mouth 2 (two) times daily for 28 days. What changed: when to take this   bismuth subsalicylate 262 MG/15ML suspension Commonly known as: PEPTO BISMOL Take 30 mLs by mouth every 6 (six) hours as needed for indigestion.   buPROPion 150 MG 24 hr tablet Commonly known as: WELLBUTRIN XL Take 150 mg by mouth  daily.   busPIRone 7.5 MG tablet Commonly known as: BUSPAR Take 7.5 mg by mouth 2 (two) times daily.   Combigan 0.2-0.5 % ophthalmic solution Generic drug: brimonidine-timolol Apply 1 drop to eye 2 (two) times daily.   docusate sodium 100 MG capsule Commonly known as: Colace Take 1 capsule (100 mg total) by mouth daily as needed for up to 30 doses.   latanoprost 0.005 % ophthalmic solution Commonly known as: XALATAN Place 1 drop into both eyes at bedtime.   levothyroxine 75 MCG tablet Commonly known as: SYNTHROID Take  75 mcg by mouth daily before breakfast.   methocarbamol 500 MG tablet Commonly known as: ROBAXIN Take 1 tablet (500 mg total) by mouth every 6 (six) hours as needed for muscle spasms (muscle pain).   oxyCODONE 5 MG immediate release tablet Commonly known as: Oxy IR/ROXICODONE Take 1 tablet (5 mg total) by mouth every 4 (four) hours as needed for severe pain.   pantoprazole 40 MG tablet Commonly known as: PROTONIX Take 40 mg by mouth as needed.   polyethylene glycol 17 g packet Commonly known as: MIRALAX / GLYCOLAX Take 17 g by mouth 2 (two) times daily. Replaces: polyethylene glycol powder 17 GM/SCOOP powder   sertraline 100 MG tablet Commonly known as: ZOLOFT TAKE 1 TABLET BY MOUTH AT  BEDTIME   sildenafil 100 MG tablet Commonly known as: VIAGRA Take 100 mg by mouth daily as needed for erectile dysfunction. pulmonary hypertension   tamsulosin 0.4 MG Caps capsule Commonly known as: FLOMAX Take 0.4 mg by mouth daily.   traZODone 50 MG tablet Commonly known as: DESYREL Take 50 mg by mouth at bedtime.       ASK your doctor about these medications    ciprofloxacin 250 MG tablet Commonly known as: CIPRO Take 250 mg by mouth 2 (two) times daily. Ask about: Should I take this medication?               Discharge Care Instructions  (From admission, onward)           Start     Ordered   08/23/22 0000  Change dressing       Comments: Maintain surgical dressing until follow up in the clinic. If the edges start to pull up, may reinforce with tape. If the dressing is no longer working, may remove and cover with gauze and tape, but must keep the area dry and clean.  Call with any questions or concerns.   08/23/22 1042            Diagnostic Studies: No results found.  Disposition: Discharge disposition: 01-Home or Self Care       Discharge Instructions     Call MD / Call 911   Complete by: As directed    If you experience chest pain or shortness of  breath, CALL 911 and be transported to the hospital emergency room.  If you develope a fever above 101 F, pus (white drainage) or increased drainage or redness at the wound, or calf pain, call your surgeon's office.   Change dressing   Complete by: As directed    Maintain surgical dressing until follow up in the clinic. If the edges start to pull up, may reinforce with tape. If the dressing is no longer working, may remove and cover with gauze and tape, but must keep the area dry and clean.  Call with any questions or concerns.   Constipation Prevention   Complete by: As directed  Drink plenty of fluids.  Prune juice may be helpful.  You may use a stool softener, such as Colace (over the counter) 100 mg twice a day.  Use MiraLax (over the counter) for constipation as needed.   Diet - low sodium heart healthy   Complete by: As directed    Increase activity slowly as tolerated   Complete by: As directed    Weight bearing as tolerated with assist device (walker, cane, etc) as directed, use it as long as suggested by your surgeon or therapist, typically at least 4-6 weeks.   Post-operative opioid taper instructions:   Complete by: As directed    POST-OPERATIVE OPIOID TAPER INSTRUCTIONS: It is important to wean off of your opioid medication as soon as possible. If you do not need pain medication after your surgery it is ok to stop day one. Opioids include: Codeine, Hydrocodone(Norco, Vicodin), Oxycodone(Percocet, oxycontin) and hydromorphone amongst others.  Long term and even short term use of opiods can cause: Increased pain response Dependence Constipation Depression Respiratory depression And more.  Withdrawal symptoms can include Flu like symptoms Nausea, vomiting And more Techniques to manage these symptoms Hydrate well Eat regular healthy meals Stay active Use relaxation techniques(deep breathing, meditating, yoga) Do Not substitute Alcohol to help with tapering If you have  been on opioids for less than two weeks and do not have pain than it is ok to stop all together.  Plan to wean off of opioids This plan should start within one week post op of your joint replacement. Maintain the same interval or time between taking each dose and first decrease the dose.  Cut the total daily intake of opioids by one tablet each day Next start to increase the time between doses. The last dose that should be eliminated is the evening dose.      TED hose   Complete by: As directed    Use stockings (TED hose) for 2 weeks on both leg(s).  You may remove them at night for sleeping.        Follow-up Information     Durene Romans, MD. Schedule an appointment as soon as possible for a visit in 2 week(s).   Specialty: Orthopedic Surgery Contact information: [address] [place] [address] [phone]                  Signed: Cassandria Anger 08/27/2022, 10:13 AM

## 2022-11-10 ENCOUNTER — Other Ambulatory Visit: Payer: Self-pay | Admitting: Internal Medicine

## 2022-11-10 ENCOUNTER — Telehealth: Payer: Self-pay

## 2022-11-10 NOTE — Telephone Encounter (Signed)
Patient requesting a refill of Bentyl.  Has not been seen since 10/2020.  Please advise

## 2022-11-13 NOTE — Telephone Encounter (Signed)
Ok to refill. Thanks

## 2022-11-14 NOTE — Telephone Encounter (Signed)
Bentyl refilled.

## 2023-03-05 ENCOUNTER — Telehealth: Payer: Self-pay | Admitting: Internal Medicine

## 2023-03-05 NOTE — Telephone Encounter (Signed)
 Inbound call from patient, states he is having an IBS flare up and would like to speak to a nurse urgently.

## 2023-03-06 ENCOUNTER — Other Ambulatory Visit: Payer: Self-pay

## 2023-03-06 NOTE — Telephone Encounter (Signed)
 Pt called and states he is having issues with his IBS. He is taking dicyclomine  10mg  in the am and pm. He wanted to know if it is ok for him to take 20mg  in the am as needed. He also wanted to know if he can take pepto along with the dicyclomine , discussed with him that was fine. Also mentioned IB gard to pt and he states he will try this also. Discussed with him taking 20mg  of dicyclomine  in the am should not be a problem. He wanted to let Dr. Abran know that '[name] called and he is doing well and hopes Dr. Abran and his family are doing well. Dr. Abran notified.

## 2023-03-06 NOTE — Telephone Encounter (Signed)
 Marvin Jones, Thanks for the update. I agree with your recommendations for him.

## 2023-03-08 NOTE — Telephone Encounter (Signed)
The pt states he would like to know the name of the over the counter med that [place] recommended.  I have advised that the med is IBgard.  He thanked me for calling and getting him the medication name.

## 2023-03-08 NOTE — Telephone Encounter (Signed)
Inbound call from patient stating he is still experiencing pain in his lower abdomen. States pain is getting worse. Patient states he would like to discuss with Dr. Marina Goodell. Please advise, thank you.

## 2023-03-09 NOTE — Telephone Encounter (Signed)
Patient called stated he still has really bad abdominal pain, requesting to speak with Dr. Marina Goodell to advise further. Thank you.

## 2023-03-13 NOTE — Telephone Encounter (Signed)
Spoke with pt and he states he has been taking the IB guard and eating yogurt with probiotics and he is better, he feels the IB guard is helping. Pt knows to call back if he has any additional problems.

## 2023-03-25 ENCOUNTER — Other Ambulatory Visit: Payer: Self-pay | Admitting: Internal Medicine

## 2023-03-25 DIAGNOSIS — F32A Depression, unspecified: Secondary | ICD-10-CM

## 2023-03-27 MED ORDER — SERTRALINE HCL 100 MG PO TABS
100.0000 mg | ORAL_TABLET | Freq: Every day | ORAL | 3 refills | Status: AC
Start: 2023-03-27 — End: ?

## 2023-03-27 NOTE — Telephone Encounter (Signed)
Refilled Zoloft

## 2023-03-27 NOTE — Addendum Note (Signed)
Addended by: Jeanine Luz on: 03/27/2023 01:37 PM   Modules accepted: Orders

## 2023-03-27 NOTE — Telephone Encounter (Signed)
Pt requesting refill of Zoloft.  Ok to refill?

## 2023-04-06 ENCOUNTER — Other Ambulatory Visit: Payer: Self-pay | Admitting: Internal Medicine

## 2023-06-28 DIAGNOSIS — H5201 Hypermetropia, right eye: Secondary | ICD-10-CM | POA: Diagnosis not present

## 2023-06-28 DIAGNOSIS — H401133 Primary open-angle glaucoma, bilateral, severe stage: Secondary | ICD-10-CM | POA: Diagnosis not present

## 2023-06-28 DIAGNOSIS — Z961 Presence of intraocular lens: Secondary | ICD-10-CM | POA: Diagnosis not present

## 2023-07-11 DIAGNOSIS — Z85828 Personal history of other malignant neoplasm of skin: Secondary | ICD-10-CM | POA: Diagnosis not present

## 2023-07-11 DIAGNOSIS — D1801 Hemangioma of skin and subcutaneous tissue: Secondary | ICD-10-CM | POA: Diagnosis not present

## 2023-07-11 DIAGNOSIS — L570 Actinic keratosis: Secondary | ICD-10-CM | POA: Diagnosis not present

## 2023-07-11 DIAGNOSIS — L821 Other seborrheic keratosis: Secondary | ICD-10-CM | POA: Diagnosis not present

## 2023-07-11 DIAGNOSIS — L853 Xerosis cutis: Secondary | ICD-10-CM | POA: Diagnosis not present

## 2023-07-11 DIAGNOSIS — C4441 Basal cell carcinoma of skin of scalp and neck: Secondary | ICD-10-CM | POA: Diagnosis not present

## 2023-07-11 DIAGNOSIS — D485 Neoplasm of uncertain behavior of skin: Secondary | ICD-10-CM | POA: Diagnosis not present

## 2023-07-11 DIAGNOSIS — D692 Other nonthrombocytopenic purpura: Secondary | ICD-10-CM | POA: Diagnosis not present

## 2023-07-11 DIAGNOSIS — L812 Freckles: Secondary | ICD-10-CM | POA: Diagnosis not present

## 2023-07-23 DIAGNOSIS — R350 Frequency of micturition: Secondary | ICD-10-CM | POA: Diagnosis not present

## 2023-07-23 DIAGNOSIS — N3020 Other chronic cystitis without hematuria: Secondary | ICD-10-CM | POA: Diagnosis not present

## 2023-07-27 DIAGNOSIS — H401123 Primary open-angle glaucoma, left eye, severe stage: Secondary | ICD-10-CM | POA: Diagnosis not present

## 2023-08-09 DIAGNOSIS — R350 Frequency of micturition: Secondary | ICD-10-CM | POA: Diagnosis not present

## 2023-08-28 DIAGNOSIS — R350 Frequency of micturition: Secondary | ICD-10-CM | POA: Diagnosis not present

## 2023-08-28 DIAGNOSIS — Z789 Other specified health status: Secondary | ICD-10-CM | POA: Diagnosis not present

## 2023-10-10 DIAGNOSIS — I10 Essential (primary) hypertension: Secondary | ICD-10-CM | POA: Diagnosis not present

## 2023-10-10 DIAGNOSIS — F5101 Primary insomnia: Secondary | ICD-10-CM | POA: Diagnosis not present

## 2023-10-10 DIAGNOSIS — R2681 Unsteadiness on feet: Secondary | ICD-10-CM | POA: Diagnosis not present

## 2023-10-13 DIAGNOSIS — R338 Other retention of urine: Secondary | ICD-10-CM | POA: Diagnosis not present

## 2023-10-13 DIAGNOSIS — Z9181 History of falling: Secondary | ICD-10-CM | POA: Diagnosis not present

## 2023-10-13 DIAGNOSIS — F5101 Primary insomnia: Secondary | ICD-10-CM | POA: Diagnosis not present

## 2023-10-13 DIAGNOSIS — M1990 Unspecified osteoarthritis, unspecified site: Secondary | ICD-10-CM | POA: Diagnosis not present

## 2023-10-13 DIAGNOSIS — K589 Irritable bowel syndrome without diarrhea: Secondary | ICD-10-CM | POA: Diagnosis not present

## 2023-10-13 DIAGNOSIS — E039 Hypothyroidism, unspecified: Secondary | ICD-10-CM | POA: Diagnosis not present

## 2023-10-13 DIAGNOSIS — R2681 Unsteadiness on feet: Secondary | ICD-10-CM | POA: Diagnosis not present

## 2023-10-13 DIAGNOSIS — Z7982 Long term (current) use of aspirin: Secondary | ICD-10-CM | POA: Diagnosis not present

## 2023-10-13 DIAGNOSIS — I10 Essential (primary) hypertension: Secondary | ICD-10-CM | POA: Diagnosis not present

## 2023-10-13 DIAGNOSIS — M8580 Other specified disorders of bone density and structure, unspecified site: Secondary | ICD-10-CM | POA: Diagnosis not present

## 2023-10-19 DIAGNOSIS — Z7982 Long term (current) use of aspirin: Secondary | ICD-10-CM | POA: Diagnosis not present

## 2023-10-19 DIAGNOSIS — K589 Irritable bowel syndrome without diarrhea: Secondary | ICD-10-CM | POA: Diagnosis not present

## 2023-10-19 DIAGNOSIS — R2681 Unsteadiness on feet: Secondary | ICD-10-CM | POA: Diagnosis not present

## 2023-10-19 DIAGNOSIS — E039 Hypothyroidism, unspecified: Secondary | ICD-10-CM | POA: Diagnosis not present

## 2023-10-19 DIAGNOSIS — M8580 Other specified disorders of bone density and structure, unspecified site: Secondary | ICD-10-CM | POA: Diagnosis not present

## 2023-10-19 DIAGNOSIS — Z9181 History of falling: Secondary | ICD-10-CM | POA: Diagnosis not present

## 2023-10-19 DIAGNOSIS — R338 Other retention of urine: Secondary | ICD-10-CM | POA: Diagnosis not present

## 2023-10-19 DIAGNOSIS — M1990 Unspecified osteoarthritis, unspecified site: Secondary | ICD-10-CM | POA: Diagnosis not present

## 2023-10-19 DIAGNOSIS — R238 Other skin changes: Secondary | ICD-10-CM | POA: Diagnosis not present

## 2023-10-19 DIAGNOSIS — I10 Essential (primary) hypertension: Secondary | ICD-10-CM | POA: Diagnosis not present

## 2023-10-30 DIAGNOSIS — R2681 Unsteadiness on feet: Secondary | ICD-10-CM | POA: Diagnosis not present

## 2023-10-30 DIAGNOSIS — I10 Essential (primary) hypertension: Secondary | ICD-10-CM | POA: Diagnosis not present

## 2023-10-30 DIAGNOSIS — Z7982 Long term (current) use of aspirin: Secondary | ICD-10-CM | POA: Diagnosis not present

## 2023-10-30 DIAGNOSIS — K589 Irritable bowel syndrome without diarrhea: Secondary | ICD-10-CM | POA: Diagnosis not present

## 2023-10-30 DIAGNOSIS — E039 Hypothyroidism, unspecified: Secondary | ICD-10-CM | POA: Diagnosis not present

## 2023-10-30 DIAGNOSIS — R338 Other retention of urine: Secondary | ICD-10-CM | POA: Diagnosis not present

## 2023-10-30 DIAGNOSIS — Z9181 History of falling: Secondary | ICD-10-CM | POA: Diagnosis not present

## 2023-10-30 DIAGNOSIS — M1990 Unspecified osteoarthritis, unspecified site: Secondary | ICD-10-CM | POA: Diagnosis not present

## 2023-10-30 DIAGNOSIS — M8580 Other specified disorders of bone density and structure, unspecified site: Secondary | ICD-10-CM | POA: Diagnosis not present

## 2023-10-30 DIAGNOSIS — F5101 Primary insomnia: Secondary | ICD-10-CM | POA: Diagnosis not present

## 2023-10-31 ENCOUNTER — Other Ambulatory Visit: Payer: Self-pay | Admitting: Internal Medicine

## 2023-10-31 DIAGNOSIS — L570 Actinic keratosis: Secondary | ICD-10-CM | POA: Diagnosis not present

## 2023-10-31 DIAGNOSIS — Z85828 Personal history of other malignant neoplasm of skin: Secondary | ICD-10-CM | POA: Diagnosis not present

## 2023-10-31 DIAGNOSIS — L812 Freckles: Secondary | ICD-10-CM | POA: Diagnosis not present

## 2023-10-31 DIAGNOSIS — L821 Other seborrheic keratosis: Secondary | ICD-10-CM | POA: Diagnosis not present

## 2023-11-06 DIAGNOSIS — E039 Hypothyroidism, unspecified: Secondary | ICD-10-CM | POA: Diagnosis not present

## 2023-11-06 DIAGNOSIS — M810 Age-related osteoporosis without current pathological fracture: Secondary | ICD-10-CM | POA: Diagnosis not present

## 2023-11-07 DIAGNOSIS — Z7982 Long term (current) use of aspirin: Secondary | ICD-10-CM | POA: Diagnosis not present

## 2023-11-08 DIAGNOSIS — R5383 Other fatigue: Secondary | ICD-10-CM | POA: Diagnosis not present

## 2023-11-08 DIAGNOSIS — E039 Hypothyroidism, unspecified: Secondary | ICD-10-CM | POA: Diagnosis not present

## 2023-11-08 DIAGNOSIS — G250 Essential tremor: Secondary | ICD-10-CM | POA: Diagnosis not present

## 2023-11-08 DIAGNOSIS — I10 Essential (primary) hypertension: Secondary | ICD-10-CM | POA: Diagnosis not present

## 2023-11-08 DIAGNOSIS — Z23 Encounter for immunization: Secondary | ICD-10-CM | POA: Diagnosis not present

## 2023-11-09 DIAGNOSIS — F5101 Primary insomnia: Secondary | ICD-10-CM | POA: Diagnosis not present

## 2023-11-09 DIAGNOSIS — Z7982 Long term (current) use of aspirin: Secondary | ICD-10-CM | POA: Diagnosis not present

## 2023-11-09 DIAGNOSIS — I10 Essential (primary) hypertension: Secondary | ICD-10-CM | POA: Diagnosis not present

## 2023-11-09 DIAGNOSIS — M1990 Unspecified osteoarthritis, unspecified site: Secondary | ICD-10-CM | POA: Diagnosis not present

## 2023-11-09 DIAGNOSIS — M8580 Other specified disorders of bone density and structure, unspecified site: Secondary | ICD-10-CM | POA: Diagnosis not present

## 2023-11-09 DIAGNOSIS — E039 Hypothyroidism, unspecified: Secondary | ICD-10-CM | POA: Diagnosis not present

## 2023-11-09 DIAGNOSIS — R338 Other retention of urine: Secondary | ICD-10-CM | POA: Diagnosis not present

## 2023-11-09 DIAGNOSIS — K589 Irritable bowel syndrome without diarrhea: Secondary | ICD-10-CM | POA: Diagnosis not present

## 2023-11-09 DIAGNOSIS — R2681 Unsteadiness on feet: Secondary | ICD-10-CM | POA: Diagnosis not present

## 2023-11-09 DIAGNOSIS — Z9181 History of falling: Secondary | ICD-10-CM | POA: Diagnosis not present

## 2023-11-12 DIAGNOSIS — E039 Hypothyroidism, unspecified: Secondary | ICD-10-CM | POA: Diagnosis not present

## 2023-11-12 DIAGNOSIS — M1990 Unspecified osteoarthritis, unspecified site: Secondary | ICD-10-CM | POA: Diagnosis not present

## 2023-11-12 DIAGNOSIS — F5101 Primary insomnia: Secondary | ICD-10-CM | POA: Diagnosis not present

## 2023-11-12 DIAGNOSIS — R2681 Unsteadiness on feet: Secondary | ICD-10-CM | POA: Diagnosis not present

## 2023-11-12 DIAGNOSIS — K589 Irritable bowel syndrome without diarrhea: Secondary | ICD-10-CM | POA: Diagnosis not present

## 2023-11-12 DIAGNOSIS — Z9181 History of falling: Secondary | ICD-10-CM | POA: Diagnosis not present

## 2023-11-12 DIAGNOSIS — Z789 Other specified health status: Secondary | ICD-10-CM | POA: Diagnosis not present

## 2023-11-12 DIAGNOSIS — I10 Essential (primary) hypertension: Secondary | ICD-10-CM | POA: Diagnosis not present

## 2023-11-12 DIAGNOSIS — R338 Other retention of urine: Secondary | ICD-10-CM | POA: Diagnosis not present

## 2023-11-12 DIAGNOSIS — M8580 Other specified disorders of bone density and structure, unspecified site: Secondary | ICD-10-CM | POA: Diagnosis not present

## 2023-11-12 DIAGNOSIS — Z7982 Long term (current) use of aspirin: Secondary | ICD-10-CM | POA: Diagnosis not present

## 2023-11-19 NOTE — Telephone Encounter (Signed)
 Marvin Jones called in asking if he needed to be seen because he can not hear out of ear we had last seen him about. I told him Dr. Ethyl is out of town until Monday. He did not want to make an appointment until speaking with Dr. Ethyl. I told him to call back on Monday about 4:30 if we had not called him back by then, Dr. Ethyl had surgery on Monday morning and may be pushed getting started for clinic when he came into office on Monday. He did say he was going to try to finish antibiotics he had been given.

## 2023-11-29 DIAGNOSIS — R5383 Other fatigue: Secondary | ICD-10-CM | POA: Diagnosis not present

## 2024-01-04 ENCOUNTER — Institutional Professional Consult (permissible substitution) (INDEPENDENT_AMBULATORY_CARE_PROVIDER_SITE_OTHER)

## 2024-01-11 ENCOUNTER — Telehealth: Payer: Self-pay

## 2024-01-11 NOTE — Telephone Encounter (Signed)
-----   Message from Norleen Kiang sent at 01/10/2024  5:37 PM EST ----- Regarding: Friend needs to see me [place], Mr. Harbor reached out to me tonight.  Wants to be seen in the office.  Prefers afternoon. I have an opening December 2 at 3 PM. Please put him in that slot. Please call him with that appointment. Thanks, Dr. Kiang

## 2024-01-11 NOTE — Telephone Encounter (Signed)
 Pt scheduled to see Dr. Abran 01/22/24@ 3PM. Pt aware of appt.

## 2024-01-22 ENCOUNTER — Ambulatory Visit: Admitting: Internal Medicine

## 2024-01-29 ENCOUNTER — Other Ambulatory Visit: Payer: Self-pay | Admitting: Internal Medicine

## 2024-01-29 DIAGNOSIS — F419 Anxiety disorder, unspecified: Secondary | ICD-10-CM

## 2024-03-12 ENCOUNTER — Encounter (INDEPENDENT_AMBULATORY_CARE_PROVIDER_SITE_OTHER): Payer: Self-pay | Admitting: Physician Assistant

## 2024-03-12 ENCOUNTER — Ambulatory Visit (INDEPENDENT_AMBULATORY_CARE_PROVIDER_SITE_OTHER): Admitting: Physician Assistant

## 2024-03-12 VITALS — BP 178/83 | HR 77 | Temp 98.0°F | Ht 69.0 in | Wt 183.0 lb

## 2024-03-12 DIAGNOSIS — H6122 Impacted cerumen, left ear: Secondary | ICD-10-CM | POA: Diagnosis not present

## 2024-03-12 DIAGNOSIS — H9193 Unspecified hearing loss, bilateral: Secondary | ICD-10-CM

## 2024-03-12 NOTE — Progress Notes (Unsigned)
 Patient took BP medication this morning at around 9:30. Patient explains that he is nervous.

## 2024-03-13 NOTE — Progress Notes (Signed)
 Dear Dr. Clarice, Here is my assessment for our mutual patient, Marvin Jones. Thank you for allowing me the opportunity to care for your patient. Please do not hesitate to contact me should you have any other questions. Sincerely, Chyrl Cohen PA-C  Otolaryngology Clinic Note Referring provider: Dr. Clarice HPI:  Marvin Jones is a 89 y.o. male kindly referred by Dr. Clarice   Discussed the use of AI scribe software for clinical note transcription with the patient, who gave verbal consent to proceed.  History of Present Illness    Marvin Jones is an 89 year old male with progressive bilateral hearing loss who presents for evaluation of worsening hearing and left ear symptoms.  He has experienced gradually progressive bilateral hearing loss over the past five to six years, with particular difficulty understanding soft voices and a tendency to increase his speaking volume. He denies significant noise exposure or direct ear trauma beyond normal environmental exposures.  He reports intermittent aural fullness in the left ear. He does not use ear swabs for self-cleaning and has not used prescription otic medications.  He has trialed multiple hearing aids, including devices from [employer] and [employer]. The [employer] hearing aids were adjusted twice without satisfactory improvement. The [employer] hearing aids initially provided better benefit, but his effectiveness diminished after several months. He alternates use of hearing aids depending on his need to hear. A caregiver notes the current hearing aids were obtained approximately 18 months to two years ago. No recent formal audiogram is available for review, but prior audiometric evaluations were performed at [facility].           Independent Review of Additional Tests or Records:  none   PMH/Meds/All/SocHx/FamHx/ROS:   Past Medical History:  Diagnosis Date   Anxiety and depression    h/o   Arthritis    Barrett's esophagus    Diaphragmatic  hernia without mention of obstruction or gangrene    Diverticulosis of colon (without mention of hemorrhage)    Esophageal reflux    HTN (hypertension)    Hyperlipidemia    Hypertrophy of prostate with urinary obstruction and other lower urinary tract symptoms (LUTS)    Hypothyroidism    Irritable bowel syndrome    Personal history of colonic polyps    Skin cancer    sees Dr Joshua    Stricture and stenosis of esophagus      Past Surgical History:  Procedure Laterality Date   CATARACT EXTRACTION, BILATERAL     COLONOSCOPY     TONSILLECTOMY     TOTAL KNEE ARTHROPLASTY Right 08/22/2022   Procedure: TOTAL KNEE ARTHROPLASTY, EXCISION SKIN WOUND;  Surgeon: Ernie Cough, MD;  Location: [facility];  Service: Orthopedics;  Laterality: Right;   TRANSURETHRAL RESECTION OF PROSTATE N/A 03/05/2020   Procedure: TRANSURETHRAL RESECTION OF THE PROSTATE (TURP), BIPOLAR;  Surgeon: Selma Cough SAUNDERS, MD;  Location: [facility];  Service: Urology;  Laterality: N/A;   UPPER GASTROINTESTINAL ENDOSCOPY      Family History  Problem Relation Age of Onset   Heart attack Father 56   Colon cancer Neg Hx    Prostate cancer Neg Hx    Hypertension Neg Hx    Rectal cancer Neg Hx    Stomach cancer Neg Hx      Social Connections: Not on file     Current Medications[1]   Physical Exam:   BP (!) 178/83   Pulse 77   Temp 98 F (36.7 C)   Ht 5' 9 (1.753 m)  Wt 183 lb (83 kg)   SpO2 94%   BMI 27.02 kg/m   Pertinent Findings  CN II-XII grossly intact Left cerumen impaction, right EAC with minimal cerumen Anterior rhinoscopy: Septum midline; bilateral inferior turbinates with no hypertrophy  No lesions of oral cavity/oropharynx; dentition WNL No obviously palpable neck masses/lymphadenopathy/thyromegaly No respiratory distress or stridor   Seprately Identifiable Procedures:  Procedure: bilateral ear microscopy and cerumen removal using microscope (CPT [phone]) - Mod 25 Pre-procedure diagnosis: unilateral  cerumen impaction left external auditory canal Post-procedure diagnosis: same Indication: unilateral  cerumen impaction; given patient's otologic complaints and history as well as for improved and comprehensive examination of external ear and tympanic membrane, bilateral otologic examination using microscope was performed and impacted cerumen removed  Procedure: Patient was placed semi-recumbent. Both ear canals were examined using the microscope with findings above. Cerumen removed from the left external auditory canal using suction and currette with improvement in EAC examination and patency. Left: EAC was patent. TM was intact . Middle ear was aerated. Drainage: none Right: EAC was patent. TM was intact . Middle ear was aerated . Drainage: none Patient tolerated the procedure well.   Impression & Plans:  Marvin Jones is a 89 y.o. male with the following   Assessment and Plan    Hearing loss - chronic - Requested hearing test results from [employer] for review. - Discussed formal audiometric evaluation in clinic with audiology. - Advised return for in-house hearing test when available. - Recommended follow-up in six months or sooner if symptoms worsen.  Impacted cerumen, left ear Impacted cerumen contributed to hearing difficulties. Removal improved hearing. - Removed impacted cerumen from left ear. - Advised return for cerumen removal if symptoms recur.           - f/u prn   Thank you for allowing me the opportunity to care for your patient. Please do not hesitate to contact me should you have any other questions.  Sincerely, Chyrl Cohen PA-C [facility] Phone: [phone] Fax: [phone]  03/13/2024, 8:16 AM        [1]  Current Outpatient Medications:    acetaminophen  (TYLENOL ) 500 MG tablet, Take 1-2 tablets (500-1,000 mg total) by mouth 2 (two) times daily., Disp: 30 tablet, Rfl: 0   amLODipine  (NORVASC ) 5 MG tablet, Take 1 tablet (5 mg total) by  mouth daily., Disp: 90 tablet, Rfl: 3   bismuth  subsalicylate (PEPTO BISMOL) 262 MG/15ML suspension, Take 30 mLs by mouth every 6 (six) hours as needed for indigestion. , Disp: , Rfl:    buPROPion  (WELLBUTRIN  XL) 150 MG 24 hr tablet, Take 150 mg by mouth daily., Disp: , Rfl:    busPIRone  (BUSPAR ) 7.5 MG tablet, Take 7.5 mg by mouth 2 (two) times daily., Disp: , Rfl:    COMBIGAN  0.2-0.5 % ophthalmic solution, Apply 1 drop to eye 2 (two) times daily., Disp: , Rfl:    dicyclomine  (BENTYL ) 10 MG capsule, TAKE ONE CAPSULE IN THE MORNING AND AT BEDTIME, Disp: 180 capsule, Rfl: 1   docusate sodium  (COLACE) 100 MG capsule, Take 1 capsule (100 mg total) by mouth daily as needed for up to 30 doses., Disp: 30 capsule, Rfl: 0   levothyroxine  (SYNTHROID ) 75 MCG tablet, Take 75 mcg by mouth daily before breakfast., Disp: , Rfl:    tamsulosin  (FLOMAX ) 0.4 MG CAPS capsule, Take 0.4 mg by mouth daily., Disp: , Rfl:    latanoprost  (XALATAN ) 0.005 % ophthalmic solution, Place 1 drop into both eyes at bedtime. (  Patient not taking: Reported on 03/12/2024), Disp: , Rfl:    methocarbamol  (ROBAXIN ) 500 MG tablet, Take 1 tablet (500 mg total) by mouth every 6 (six) hours as needed for muscle spasms (muscle pain). (Patient not taking: Reported on 03/12/2024), Disp: 40 tablet, Rfl: 2   mirtazapine (REMERON) 15 MG tablet, Take 15 mg by mouth daily. (Patient not taking: Reported on 03/12/2024), Disp: , Rfl:    oxyCODONE  (OXY IR/ROXICODONE ) 5 MG immediate release tablet, Take 1 tablet (5 mg total) by mouth every 4 (four) hours as needed for severe pain. (Patient not taking: Reported on 03/12/2024), Disp: 42 tablet, Rfl: 0   pantoprazole  (PROTONIX ) 40 MG tablet, Take 40 mg by mouth as needed. (Patient not taking: Reported on 03/12/2024), Disp: , Rfl:    polyethylene glycol (MIRALAX  / GLYCOLAX ) 17 g packet, Take 17 g by mouth 2 (two) times daily. (Patient not taking: Reported on 03/12/2024), Disp: 14 each, Rfl: 0   sertraline  (ZOLOFT ) 100  MG tablet, Take 1 tablet (100 mg total) by mouth at bedtime. (Patient not taking: Reported on 03/12/2024), Disp: 90 tablet, Rfl: 3   sildenafil (VIAGRA) 100 MG tablet, Take 100 mg by mouth daily as needed for erectile dysfunction. pulmonary hypertension (Patient not taking: Reported on 03/12/2024), Disp: , Rfl:    traZODone (DESYREL) 50 MG tablet, Take 50 mg by mouth at bedtime. (Patient not taking: Reported on 03/12/2024), Disp: , Rfl:

## 2024-03-21 ENCOUNTER — Other Ambulatory Visit: Payer: Self-pay | Admitting: Orthopedic Surgery

## 2024-03-21 DIAGNOSIS — M5416 Radiculopathy, lumbar region: Secondary | ICD-10-CM

## 2024-04-10 ENCOUNTER — Other Ambulatory Visit

## 2024-09-10 ENCOUNTER — Ambulatory Visit (INDEPENDENT_AMBULATORY_CARE_PROVIDER_SITE_OTHER): Admitting: Physician Assistant
